# Patient Record
Sex: Female | Born: 1945
Health system: Southern US, Community
[De-identification: ages and names within clinical notes are randomized; demographics above are authoritative.]

## PROBLEM LIST (undated history)

## (undated) DIAGNOSIS — H269 Unspecified cataract: Secondary | ICD-10-CM

## (undated) DIAGNOSIS — E215 Disorder of parathyroid gland, unspecified: Secondary | ICD-10-CM

## (undated) DIAGNOSIS — K449 Diaphragmatic hernia without obstruction or gangrene: Secondary | ICD-10-CM

## (undated) DIAGNOSIS — F329 Major depressive disorder, single episode, unspecified: Secondary | ICD-10-CM

## (undated) DIAGNOSIS — J45909 Unspecified asthma, uncomplicated: Secondary | ICD-10-CM

## (undated) DIAGNOSIS — K219 Gastro-esophageal reflux disease without esophagitis: Secondary | ICD-10-CM

## (undated) DIAGNOSIS — F4310 Post-traumatic stress disorder, unspecified: Secondary | ICD-10-CM

## (undated) DIAGNOSIS — C801 Malignant (primary) neoplasm, unspecified: Secondary | ICD-10-CM

## (undated) DIAGNOSIS — R112 Nausea with vomiting, unspecified: Secondary | ICD-10-CM

## (undated) DIAGNOSIS — A048 Other specified bacterial intestinal infections: Secondary | ICD-10-CM

## (undated) DIAGNOSIS — E785 Hyperlipidemia, unspecified: Secondary | ICD-10-CM

## (undated) DIAGNOSIS — F411 Generalized anxiety disorder: Secondary | ICD-10-CM

## (undated) DIAGNOSIS — F419 Anxiety disorder, unspecified: Secondary | ICD-10-CM

## (undated) DIAGNOSIS — K759 Inflammatory liver disease, unspecified: Secondary | ICD-10-CM

## (undated) DIAGNOSIS — M1990 Unspecified osteoarthritis, unspecified site: Secondary | ICD-10-CM

## (undated) DIAGNOSIS — R011 Cardiac murmur, unspecified: Secondary | ICD-10-CM

## (undated) DIAGNOSIS — Z9889 Other specified postprocedural states: Secondary | ICD-10-CM

## (undated) DIAGNOSIS — R251 Tremor, unspecified: Secondary | ICD-10-CM

## (undated) HISTORY — DX: Unspecified cataract: H26.9

## (undated) HISTORY — DX: Malignant (primary) neoplasm, unspecified: C80.1

## (undated) HISTORY — DX: Major depressive disorder, single episode, unspecified: F32.9

## (undated) HISTORY — DX: Disorder of parathyroid gland, unspecified: E21.5

## (undated) HISTORY — DX: Tremor, unspecified: R25.1

## (undated) HISTORY — DX: Diaphragmatic hernia without obstruction or gangrene: K44.9

## (undated) HISTORY — DX: Hyperlipidemia, unspecified: E78.5

## (undated) HISTORY — DX: Generalized anxiety disorder: F41.1

## (undated) HISTORY — DX: Unspecified osteoarthritis, unspecified site: M19.90

## (undated) HISTORY — PX: RETINAL DETACHMENT SURGERY: SHX105

## (undated) HISTORY — DX: Post-traumatic stress disorder, unspecified: F43.10

## (undated) HISTORY — DX: Other specified bacterial intestinal infections: A04.8

## (undated) HISTORY — PX: CATARACT EXTRACTION, BILATERAL: SHX1313

## (undated) HISTORY — DX: Unspecified asthma, uncomplicated: J45.909

## (undated) HISTORY — DX: Anxiety disorder, unspecified: F41.9

## (undated) HISTORY — DX: Cardiac murmur, unspecified: R01.1

---

## 1991-05-11 HISTORY — PX: TUBAL LIGATION: SHX77

## 1995-05-11 HISTORY — PX: PARATHYROIDECTOMY: SHX19

## 1996-05-10 HISTORY — PX: LAPAROSCOPIC CHOLECYSTECTOMY: SUR755

## 2006-07-22 ENCOUNTER — Ambulatory Visit: Payer: Self-pay | Admitting: Unknown Physician Specialty

## 2009-07-08 ENCOUNTER — Ambulatory Visit: Payer: Self-pay | Admitting: Internal Medicine

## 2009-07-16 ENCOUNTER — Ambulatory Visit: Payer: Self-pay | Admitting: Internal Medicine

## 2009-08-08 ENCOUNTER — Ambulatory Visit: Payer: Self-pay | Admitting: Internal Medicine

## 2009-09-07 ENCOUNTER — Ambulatory Visit: Payer: Self-pay | Admitting: Internal Medicine

## 2010-05-10 HISTORY — PX: RETINAL DETACHMENT SURGERY: SHX105

## 2012-04-25 ENCOUNTER — Ambulatory Visit: Payer: Self-pay | Admitting: Ophthalmology

## 2012-05-09 ENCOUNTER — Ambulatory Visit: Payer: Self-pay | Admitting: Ophthalmology

## 2012-05-10 DIAGNOSIS — H269 Unspecified cataract: Secondary | ICD-10-CM

## 2012-05-10 HISTORY — DX: Unspecified cataract: H26.9

## 2012-07-04 ENCOUNTER — Ambulatory Visit: Payer: Self-pay | Admitting: Ophthalmology

## 2012-08-08 DIAGNOSIS — F341 Dysthymic disorder: Secondary | ICD-10-CM | POA: Diagnosis not present

## 2012-09-07 DIAGNOSIS — F341 Dysthymic disorder: Secondary | ICD-10-CM | POA: Diagnosis not present

## 2012-10-12 DIAGNOSIS — F341 Dysthymic disorder: Secondary | ICD-10-CM | POA: Diagnosis not present

## 2012-11-23 DIAGNOSIS — F341 Dysthymic disorder: Secondary | ICD-10-CM | POA: Diagnosis not present

## 2012-12-26 DIAGNOSIS — F341 Dysthymic disorder: Secondary | ICD-10-CM | POA: Diagnosis not present

## 2013-01-18 DIAGNOSIS — F341 Dysthymic disorder: Secondary | ICD-10-CM | POA: Diagnosis not present

## 2013-02-22 DIAGNOSIS — F341 Dysthymic disorder: Secondary | ICD-10-CM | POA: Diagnosis not present

## 2013-03-01 DIAGNOSIS — F341 Dysthymic disorder: Secondary | ICD-10-CM | POA: Diagnosis not present

## 2013-03-15 DIAGNOSIS — F341 Dysthymic disorder: Secondary | ICD-10-CM | POA: Diagnosis not present

## 2013-04-25 ENCOUNTER — Ambulatory Visit (INDEPENDENT_AMBULATORY_CARE_PROVIDER_SITE_OTHER): Payer: PRIVATE HEALTH INSURANCE | Admitting: Family Medicine

## 2013-04-25 ENCOUNTER — Encounter: Payer: Self-pay | Admitting: Family Medicine

## 2013-04-25 VITALS — BP 126/73 | HR 72 | Temp 97.7°F | Resp 18 | Ht 65.5 in | Wt 235.0 lb

## 2013-04-25 DIAGNOSIS — Z1211 Encounter for screening for malignant neoplasm of colon: Secondary | ICD-10-CM

## 2013-04-25 DIAGNOSIS — Z01419 Encounter for gynecological examination (general) (routine) without abnormal findings: Secondary | ICD-10-CM

## 2013-04-25 DIAGNOSIS — Z23 Encounter for immunization: Secondary | ICD-10-CM

## 2013-04-25 DIAGNOSIS — Z0000 Encounter for general adult medical examination without abnormal findings: Secondary | ICD-10-CM

## 2013-04-25 LAB — CBC WITH DIFFERENTIAL/PLATELET
Basophils Relative: 1 % (ref 0–1)
Eosinophils Absolute: 0.2 10*3/uL (ref 0.0–0.7)
Eosinophils Relative: 2 % (ref 0–5)
HCT: 40.1 % (ref 36.0–46.0)
Hemoglobin: 14 g/dL (ref 12.0–15.0)
Lymphs Abs: 2.4 10*3/uL (ref 0.7–4.0)
MCH: 32.3 pg (ref 26.0–34.0)
MCHC: 34.9 g/dL (ref 30.0–36.0)
MCV: 92.4 fL (ref 78.0–100.0)
Monocytes Absolute: 0.6 10*3/uL (ref 0.1–1.0)
Monocytes Relative: 8 % (ref 3–12)
Neutrophils Relative %: 54 % (ref 43–77)
RDW: 14 % (ref 11.5–15.5)
WBC: 7 10*3/uL (ref 4.0–10.5)

## 2013-04-25 LAB — POCT UA - MICROSCOPIC ONLY
Crystals, Ur, HPF, POC: NEGATIVE
Yeast, UA: NEGATIVE

## 2013-04-25 LAB — COMPLETE METABOLIC PANEL WITH GFR
Alkaline Phosphatase: 81 U/L (ref 39–117)
BUN: 11 mg/dL (ref 6–23)
CO2: 26 mEq/L (ref 19–32)
GFR, Est African American: >89 mL/min
GFR, Est Non African American: 84 mL/min
Glucose, Bld: 88 mg/dL (ref 70–99)
Total Bilirubin: 1 mg/dL (ref 0.3–1.2)
Total Protein: 6.9 g/dL (ref 6.0–8.3)

## 2013-04-25 LAB — POCT URINALYSIS DIPSTICK
Bilirubin, UA: NEGATIVE
Blood, UA: NEGATIVE
Glucose, UA: NEGATIVE
Ketones, UA: NEGATIVE
Nitrite, UA: NEGATIVE
Protein, UA: NEGATIVE
Spec Grav, UA: >=1.03
Urobilinogen, UA: 0.2
pH, UA: 5

## 2013-04-25 LAB — LIPID PANEL
Cholesterol: 199 mg/dL (ref 0–200)
HDL: 58 mg/dL (ref >39)
Total CHOL/HDL Ratio: 3.4 Ratio
VLDL: 23 mg/dL (ref 0–40)

## 2013-04-25 LAB — HEMOGLOBIN A1C: Mean Plasma Glucose: 111 mg/dL (ref <117)

## 2013-04-25 NOTE — Progress Notes (Signed)
Subjective:    Patient ID: Jasmine Malone, female    DOB: 09/19/1945, 67 y.o.   MRN: [id number]  HPI This 67 y.o. female presents to establish care and for CPE.  Last physical 2013. Pap smear 2013. Mammogram several years; agreeable to mammogram. Colonoscopy never; s/p sigmoidoscopy 10 years ago.  Ready for referral to GI. TDAP 2013. Pneumovax at last visit. Zostavax unknown; not sure if has had chicken pox. Flu vaccines due; agreeable. Eye exam s/p cataract surgery.  [facility]. Dental exam working on them.   Thoracic/lumbar back spine:  Onset two years ago.    L posterior cervical LAD:  Not sure of duration; has not noticed.  Has been suffering with a lot of nasal congestion for weeks.   Review of Systems  Constitutional: Negative.   HENT: Positive for dental problem, postnasal drip, sinus pressure, sneezing and sore throat. Negative for ear pain, facial swelling, hearing loss, mouth sores and nosebleeds.   Eyes: Positive for photophobia. Negative for pain, discharge, itching and visual disturbance.  Respiratory: Positive for cough and wheezing. Negative for choking, chest tightness, shortness of breath and stridor.   Cardiovascular: Negative for chest pain, palpitations and leg swelling.  Gastrointestinal: Positive for constipation. Negative for nausea, vomiting, abdominal pain, blood in stool, abdominal distention, anal bleeding and rectal pain.  Endocrine: Positive for polydipsia.  Genitourinary: Positive for dysuria, decreased urine volume and difficulty urinating. Negative for urgency, frequency, hematuria, flank pain, vaginal bleeding, vaginal discharge, enuresis, genital sores, vaginal pain, menstrual problem, pelvic pain and dyspareunia.  Musculoskeletal: Positive for back pain and myalgias. Negative for arthralgias, gait problem, joint swelling, neck pain and neck stiffness.  Skin: Negative.   Allergic/Immunologic: Negative.   Neurological: Negative.     Hematological: Negative.   Psychiatric/Behavioral: Negative for suicidal ideas, sleep disturbance, self-injury, dysphoric mood and decreased concentration. The patient is nervous/anxious.        Objective:   Physical Exam  Nursing note and vitals reviewed. Constitutional: She is oriented to person, place, and time. She appears well-developed and well-nourished. No distress.  HENT:  Head: Normocephalic and atraumatic.  Right Ear: External ear normal.  Left Ear: External ear normal.  Nose: Nose normal.  Mouth/Throat: Oropharynx is clear and moist.  Eyes: Conjunctivae and EOM are normal. Pupils are equal, round, and reactive to light.  Neck: Normal range of motion and full passive range of motion without pain. Neck supple. No JVD present. Carotid bruit is not present. No thyromegaly present.  Cardiovascular: Normal rate, regular rhythm and normal heart sounds.  Exam reveals no gallop and no friction rub.   No murmur heard. Pulmonary/Chest: Effort normal and breath sounds normal. She has no wheezes. She has no rales. Right breast exhibits no inverted nipple, no mass, no nipple discharge and no skin change. Left breast exhibits no inverted nipple, no mass, no nipple discharge, no skin change and no tenderness. Breasts are symmetrical.  Abdominal: Soft. Bowel sounds are normal. She exhibits no distension and no mass. There is no tenderness. There is no rebound and no guarding.  Genitourinary: Vagina normal and uterus normal. No breast swelling, tenderness, discharge or bleeding. There is no rash, tenderness, lesion or injury on the right labia. There is no rash, tenderness, lesion or injury on the left labia. Cervix exhibits no motion tenderness, no discharge and no friability. Right adnexum displays no mass, no tenderness and no fullness. Left adnexum displays no mass, no tenderness and no fullness.  Musculoskeletal:  Right shoulder: Normal.       Left shoulder: Normal.       Cervical  back: Normal.  Lymphadenopathy:    She has cervical adenopathy.       Right cervical: No superficial cervical, no deep cervical and no posterior cervical adenopathy present.      Left cervical: Posterior cervical adenopathy present. No superficial cervical and no deep cervical adenopathy present.  Neurological: She is alert and oriented to person, place, and time. She has normal reflexes. No cranial nerve deficit. She exhibits normal muscle tone. Coordination normal.  Skin: Skin is warm and dry. No rash noted. She is not diaphoretic. No erythema. No pallor.  Psychiatric: She has a normal mood and affect. Her behavior is normal. Judgment and thought content normal.   INFLUENZA VACCINE ADMINISTERED.     Assessment & Plan:  Routine general medical examination at a health care facility - Plan: CBC with Differential, COMPLETE METABOLIC PANEL WITH GFR, Lipid panel, TSH, POCT urinalysis dipstick, EKG 12-Lead, Hemoglobin A1c, POCT UA - Microscopic Only  Routine gynecological examination - Plan: MM Digital Screening, Pap IG (Image Guided), CANCELED: Pap IG (Image Guided)  Colon cancer screening - Plan: Ambulatory referral to Gastroenterology, IFOBT POC (occult bld, rslt in office)  Need for prophylactic vaccination and inoculation against influenza - Plan: Flu Vaccine QUAD 36+ mos IM  1. Complete physical examination/Welcome to Medicare CPE: anticipatory guidance --- weight loss, exercise. Pap smear obtained; refer for mammogram.  Refer for colonoscopy.  S/p flu vaccine in office; check with insurance regarding Zostavax coverage.  S/p Pneumovax ast last visit.  Independent with ADLs; no hearing loss; chronic depression which is stable.   Low to moderate fall risk.  2.  Gynecological exam: pap smear obtained; refer for mammogram. 3.  Colon cancer screening: refer for colonoscopy; hemosure obtained. 4. S/p flu vaccine. 5.  Cervical LAD: New. Associated with acute sinus infection that is improving; RTC  in one month for recheck. If persistent, will warrant further evaluation. 6. Chronic back pain: New; return to evaluate back pain further in one month. No orders of the defined types were placed in this encounter.   Nilda Simmer, M.D.  [facility] [address] [place], [place]  [address] [phone] phone [phone] fax

## 2013-04-26 ENCOUNTER — Encounter: Payer: Self-pay | Admitting: Internal Medicine

## 2013-04-26 LAB — PAP IG (IMAGE GUIDED)

## 2013-04-26 LAB — TSH: TSH: 2.986 u[IU]/mL (ref 0.350–4.500)

## 2013-05-11 ENCOUNTER — Encounter: Payer: Self-pay | Admitting: Radiology

## 2013-05-12 ENCOUNTER — Ambulatory Visit (INDEPENDENT_AMBULATORY_CARE_PROVIDER_SITE_OTHER): Payer: PRIVATE HEALTH INSURANCE | Admitting: Internal Medicine

## 2013-05-12 VITALS — BP 130/80 | HR 60 | Temp 99.1°F | Resp 18 | Ht 66.0 in | Wt 230.0 lb

## 2013-05-12 DIAGNOSIS — R05 Cough: Secondary | ICD-10-CM

## 2013-05-12 DIAGNOSIS — R059 Cough, unspecified: Secondary | ICD-10-CM | POA: Diagnosis not present

## 2013-05-12 DIAGNOSIS — J019 Acute sinusitis, unspecified: Secondary | ICD-10-CM | POA: Diagnosis not present

## 2013-05-12 MED ORDER — HYDROCODONE-HOMATROPINE 5-1.5 MG/5ML PO SYRP: 5.0000 mL | ORAL_SOLUTION | Freq: Four times a day (QID) | ORAL | Status: DC | PRN

## 2013-05-12 MED ORDER — AMOXICILLIN 500 MG PO CAPS: 1000.0000 mg | ORAL_CAPSULE | Freq: Two times a day (BID) | ORAL | Status: AC

## 2013-05-12 NOTE — Progress Notes (Signed)
Subjective:    Patient ID: Jasmine Malone, female    DOB: 05-02-1946, 68 y.o.   MRN: [id number]  HPI  This chart was scribed for Jasmine Malone by Andrew Au, Scribe. This patient was seen in room 5 and the patient's care was started at 9:24 AM.  HPI Comments: Jasmine Malone is a 68 y.o. female who [facility] the [facility] complaining of dark green drainage fr nose, pain in ears, with associated epistaxis oin am, cough, and facial pain. Pt states that she has sick contacts. Pt states her symptoms worsen with certain movements for example when bending  forward. Pt husband states that the pt cough keeps her up at night.  She denies wheezing. She states that she allergic to dust. She reports history of asthma.  Has been ill for over one week   Past Medical History  Diagnosis Date  . Depression   . Heart murmur   . Anxiety     parental strain; previous medicaiton; ;followed by therapist once per month; Nan Hyball in [facility]  Marland Kitchen Asthma     Albuterol use prior to exercise.  No hospitalizations or ED visits.  . Cataracts, bilateral 05/10/2012    s/p B cataract resection.   Past Surgical History  Procedure Laterality Date  . Cholecystectomy    . Tubal ligation    . Parathyroidectomy      3 resected.    . Eye surgery      s/p cataract surgery B; retinal detachment R   Family History  Problem Relation Age of Onset  . Stroke Mother 66    CVA; tobacco abuse.  . Mental illness Mother   . Alcohol abuse Mother   . Diabetes Father   . Cancer Father     brain cancer/malignancy  . Mental illness Father   . Diabetes Brother    History   Social History  . Marital Status: Married    Spouse Name: N/A    Number of Children: N/A  . Years of Education: N/A   Occupational History  . Not on file.   Social History Main Topics  . Smoking status: Never Smoker   . Smokeless tobacco: Never Used  . Alcohol Use: Yes  . Drug Use: No  . Sexual Activity: Yes   Other Topics  Concern  . Not on file   Social History Narrative   Marital status: married x 40+ years; happily married; no abuse.       Children:  4 children (2 girls 2 boys 8, 38, 35, 30); 6 grandchildren.      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      Tobacco:  None         Allergies  Allergen Reactions  . Codeine Nausea Only  . Ibuprofen     Anti inflammatory    There are no active problems to display for this patient.  No orders of the defined types were placed in this encounter.   BP 130/80  Pulse 60  Temp(Src) 99.1 F (37.3 C) (Oral)  Resp 18  Ht 5\' 6"  (1.676 m)  Wt 230 lb (104.327 kg)  BMI 37.14 kg/m2  SpO2 96%   Review of Systems Noncontributory    Objective:   Physical Exam BP 130/80  Pulse 60  Temp(Src) 99.1 F (37.3 C) (Oral)  Resp 18  Ht 5\' 6"  (1.676 m)  Wt 230 lb (104.327 kg)  BMI 37.14 kg/m2  SpO2 96% TMs and conjunctiva clear Nares boggy with purulent discharge Tender maxillary areas to percussion Throat clear/no nodes Chest clear to auscultation       Assessment & Plan:  I have completed the patient encounter in its entirety as documented by the scribe, with editing by me where necessary. Jasmine Malone, M.D. Acute sinusitis, unspecified  Cough  Meds ordered this encounter  Medications  . HYDROcodone-homatropine (HYCODAN) 5-1.5 MG/5ML syrup    Sig: Take 5 mLs by mouth every 6 (six) hours as needed for cough.    Dispense:  120 mL    Refill:  0  . amoxicillin (AMOXIL) 500 MG capsule    Sig: Take 2 capsules (1,000 mg total) by mouth 2 (two) times daily.    Dispense:  40 capsule    Refill:  0

## 2013-05-23 ENCOUNTER — Ambulatory Visit: Payer: Medicare Other

## 2013-05-23 ENCOUNTER — Encounter: Payer: Self-pay | Admitting: Family Medicine

## 2013-05-23 ENCOUNTER — Ambulatory Visit (INDEPENDENT_AMBULATORY_CARE_PROVIDER_SITE_OTHER): Payer: PRIVATE HEALTH INSURANCE | Admitting: Family Medicine

## 2013-05-23 VITALS — BP 128/72 | HR 76 | Temp 98.0°F | Resp 16 | Ht 65.5 in | Wt 234.8 lb

## 2013-05-23 DIAGNOSIS — S39012A Strain of muscle, fascia and tendon of lower back, initial encounter: Secondary | ICD-10-CM

## 2013-05-23 DIAGNOSIS — M7061 Trochanteric bursitis, right hip: Secondary | ICD-10-CM

## 2013-05-23 DIAGNOSIS — M25559 Pain in unspecified hip: Secondary | ICD-10-CM

## 2013-05-23 DIAGNOSIS — M25569 Pain in unspecified knee: Secondary | ICD-10-CM

## 2013-05-23 DIAGNOSIS — R590 Localized enlarged lymph nodes: Secondary | ICD-10-CM

## 2013-05-23 DIAGNOSIS — S335XXA Sprain of ligaments of lumbar spine, initial encounter: Secondary | ICD-10-CM | POA: Diagnosis not present

## 2013-05-23 DIAGNOSIS — M545 Low back pain, unspecified: Secondary | ICD-10-CM | POA: Diagnosis not present

## 2013-05-23 DIAGNOSIS — R599 Enlarged lymph nodes, unspecified: Secondary | ICD-10-CM | POA: Diagnosis not present

## 2013-05-23 DIAGNOSIS — M76899 Other specified enthesopathies of unspecified lower limb, excluding foot: Secondary | ICD-10-CM | POA: Diagnosis not present

## 2013-05-23 DIAGNOSIS — M25551 Pain in right hip: Secondary | ICD-10-CM

## 2013-05-23 DIAGNOSIS — M25561 Pain in right knee: Secondary | ICD-10-CM

## 2013-05-23 MED ORDER — METHOCARBAMOL 500 MG PO TABS: 500.0000 mg | ORAL_TABLET | Freq: Three times a day (TID) | ORAL | Status: DC | PRN

## 2013-05-23 NOTE — Patient Instructions (Signed)

## 2013-05-23 NOTE — Progress Notes (Addendum)
Subjective:    Patient ID: Jasmine Malone, female    DOB: 1945/06/15, 68 y.o.   MRN: [id number]  HPI This chart was scribed for Kristi Smith-MD, by Ladona Ridgel Santos Sollenberger, Scribe. This patient was seen in room 21 and the patient's care was started at 4:46 PM.  HPI Comments: Jasmine Malone is a 68 y.o. female who presents to the [facility] for recheck of enlarged left posterior cervical lymph node and low back pain.  She was seen here last for a sinus infection 1/03 by Dr. Merla Riches - finished AMX yesterday and reports that she feels much better.    She reports left posterior cervical lymph node feels somewhat smaller than last time she was here but is still minimally painful. Although she states it was temporarily enlarged again during her sinus infection episode last week.  Patient admits to poor dentition and wonders if this contributes to L cervical LAD posteriorly.    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 She reports pain radiates down her right lateral leg. She reports exercising seems to improve her pain; specifically using the elliptical machine. 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 She states that she tries to bend her knees when she picks her up but this is also difficult w/her back pain. She states last week, her back pain made it difficult to sleep. She reports that she has had a similar back problem before when she was lifting a child out of her car.  She has been using ibuprofen w/minimal relief. She has not tried Tylenol.  She denies n/t/w in legs; denies b/b dysfunction; denies saddle paresthesias.  Feels that obesity also  contributing.  She reports that she has chills at at night when she is tired. She denies any sores in her mouth, fever.  Also suffers with chronic R hip pain ; +pain with laying on R side at night.  No limping due to R hip pain.  No previous xray.    Injured R knee two years ago with a fall; suffered with daily knee pain for quite a while; knee pain much improved.  No swelling; no giving out. Thinks she must have OA in R knee.  No swelling.  No popping.   There are no active problems to display for this patient.  Past Surgical History  Procedure Laterality Date  . Cholecystectomy    . Tubal ligation    . Parathyroidectomy      3 resected.    . Eye surgery      s/p cataract surgery B; retinal detachment R   Family History  Problem Relation Age of Onset  . Stroke Mother 63    CVA; tobacco abuse.  . Mental illness Mother   . Alcohol abuse Mother   . Diabetes Father   . Cancer Father     brain cancer/malignancy  . Mental illness Father   . Diabetes Brother    History   Social History  . Marital Status: Married    Spouse Name: N/A    Number of Children: N/A  . Years of Education: N/A  Occupational History  . Not on file.   Social History Main Topics  . Smoking status: Never Smoker   . Smokeless tobacco: Never Used  . Alcohol Use: Yes  . Drug Use: No  . Sexual Activity: Yes   Other Topics Concern  . Not on file   Social History Narrative   Marital status: married x 40+ years; happily married; no abuse.       Children:  4 children (2 girls 2 boys 10, 38, 35, 30); 6 grandchildren.      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      Tobacco:  None         Allergies  Allergen Reactions  . Codeine Nausea Only  . Ibuprofen     Anti inflammatory    Results for orders placed in visit on 04/25/13  CBC WITH DIFFERENTIAL      Result Value Range   WBC 7.0  4.0 - 10.5 K/uL   RBC 4.34  3.87 - 5.11 MIL/uL   Hemoglobin 14.0  12.0 -  15.0 g/dL   HCT 16.1  09.6 - 04.5 %   MCV 92.4  78.0 - 100.0 fL   MCH 32.3  26.0 - 34.0 pg   MCHC 34.9  30.0 - 36.0 g/dL   RDW 40.9  81.1 - 91.4 %   Platelets 240  150 - 400 K/uL   Neutrophils Relative % 54  43 - 77 %   Neutro Abs 3.8  1.7 - 7.7 K/uL   Lymphocytes Relative 35  12 - 46 %   Lymphs Abs 2.4  0.7 - 4.0 K/uL   Monocytes Relative 8  3 - 12 %   Monocytes Absolute 0.6  0.1 - 1.0 K/uL   Eosinophils Relative 2  0 - 5 %   Eosinophils Absolute 0.2  0.0 - 0.7 K/uL   Basophils Relative 1  0 - 1 %   Basophils Absolute 0.1  0.0 - 0.1 K/uL   Smear Review Criteria for review not met    COMPLETE METABOLIC PANEL WITH GFR      Result Value Range   Sodium 140  135 - 145 mEq/L   Potassium 3.9  3.5 - 5.3 mEq/L   Chloride 105  96 - 112 mEq/L   CO2 26  19 - 32 mEq/L   Glucose, Bld 88  70 - 99 mg/dL   BUN 11  6 - 23 mg/dL   Creat 7.82  9.56 - 2.13 mg/dL   Total Bilirubin 1.0  0.3 - 1.2 mg/dL   Alkaline Phosphatase 81  39 - 117 U/L   AST 27  0 - 37 U/L   ALT 17  0 - 35 U/L   Total Protein 6.9  6.0 - 8.3 g/dL   Albumin 3.9  3.5 - 5.2 g/dL   Calcium 9.1  8.4 - 08.6 mg/dL   GFR, Est African American >89     GFR, Est Non African American 84    LIPID PANEL      Result Value Range   Cholesterol 199  0 - 200 mg/dL   Triglycerides 578  <469 mg/dL   HDL 58  >62 mg/dL   Total CHOL/HDL Ratio 3.4     VLDL 23  0 - 40 mg/dL   LDL Cholesterol 952 (*) 0 - 99 mg/dL  TSH      Result Value Range   TSH 2.986  0.350 - 4.500 uIU/mL  HEMOGLOBIN A1C      Result Value Range   Hemoglobin A1C 5.5  <5.7 %   Mean Plasma Glucose 111  <117 mg/dL  POCT URINALYSIS DIPSTICK      Result Value Range   Color, UA yellow     Clarity, UA slightly cloudy     Glucose, UA neg     Bilirubin, UA neg     Ketones, UA neg     Spec Grav, UA >=1.030     Blood, UA neg     pH, UA 5.0     Protein, UA neg     Urobilinogen, UA 0.2     Nitrite, UA neg     Leukocytes, UA Wexler (1+)    POCT UA - MICROSCOPIC ONLY       Result Value Range   WBC, Ur, HPF, POC tntc     RBC, urine, microscopic 2-3     Bacteria, U Microscopic 1+     Mucus, UA pos     Epithelial cells, urine per micros 0-1     Crystals, Ur, HPF, POC neg     Casts, Ur, LPF, POC neg     Yeast, UA neg    IFOBT (OCCULT BLOOD)      Result Value Range   IFOBT Negative    PAP IG (IMAGE GUIDED)      Result Value Range   Specimen adequacy:       FINAL DIAGNOSIS:       Cytotechnologist:       Review of Systems  Constitutional: Negative for fever, chills, diaphoresis and fatigue.  HENT: Negative for congestion, ear pain, rhinorrhea, sinus pressure and sore throat.   Respiratory: Negative for cough and shortness of breath.   Cardiovascular: Negative for chest pain.  Gastrointestinal: Negative for abdominal pain.  Musculoskeletal: Positive for arthralgias, back pain and myalgias. Negative for joint swelling, neck pain and neck stiffness.  Skin: Negative for rash.  Neurological: Negative for weakness and numbness.  Hematological: Positive for adenopathy. Does not bruise/bleed easily.      Objective:   Physical Exam  Nursing note and vitals reviewed. Constitutional: She is oriented to person, place, and time. She appears well-developed and well-nourished. No distress.  HENT:  Head: Normocephalic and atraumatic.  Right Ear: External ear normal.  Left Ear: External ear normal.  Nose: Nose normal.  Mouth/Throat: Oropharynx is clear and moist. No oropharyngeal exudate.  Eyes: Conjunctivae and EOM are normal. Pupils are equal, round, and reactive to light. Right eye exhibits no discharge. Left eye exhibits no discharge.  Neck: Normal range of motion. No thyromegaly present.  Left posterior cervical lymphadenopathy 0.5cm in diameter; +mild TTP but significantly smaller from exam in 04/2013.  Cardiovascular: Normal rate, regular rhythm and normal heart sounds.   No murmur heard. Pulmonary/Chest: Effort normal and breath sounds normal. No  respiratory distress. She has no wheezes. She has no rales.  Musculoskeletal: Normal range of motion. She exhibits tenderness. She exhibits no edema.       Right hip: She exhibits tenderness and bony tenderness. She exhibits normal range of motion, normal strength and no swelling.       Right knee: She exhibits normal range of motion, no swelling, no erythema, normal alignment, normal patellar mobility, normal meniscus and no MCL laxity. Tenderness found. Medial joint line and lateral joint line tenderness noted. No patellar tendon tenderness noted.       Lumbar back: She exhibits pain. She exhibits normal range of  motion, no tenderness and no spasm.  Full ROM of lumbar spine w/out pain.  No midline tenderness to palpation and no paraspinal tenderness to palpation. SLR negative bilaterally. 5/5 motor strength BLE Marching and heel to toe intact. Right hip tenderness to palpation right trochanter.  Full internal and external rotation of right hip. Pain w/resistance of internal rotation right knee w/out effusion. Mild tenderness to palpation lateral joint line.   Lymphadenopathy:    She has cervical adenopathy.  Neurological: She is alert and oriented to person, place, and time.  Skin: Skin is warm and dry.  Psychiatric: She has a normal mood and affect. Thought content normal.   Triage Vitals: BP 128/72  Pulse 76  Temp(Src) 98 F (36.7 C) (Oral)  Resp 16  Ht 5' 5.5" (1.664 m)  Wt 234 lb 12.8 oz (106.505 kg)  BMI 38.46 kg/m2  SpO2 98%  DIAGNOSTIC STUDIES: Oxygen Saturation is 98% on room air, normal by my interpretation.    COORDINATION OF CARE: At 405 PM Discussed treatment plan with patient which includes back, him and knee X-rays. Patient agrees.     [facility] reading (PRIMARY) by  Dr. Katrinka Blazing. LUMBAR SPINE: DIFFUSE SPURRING; DDD VARIOUS LEVELS.  R KNEE FILMS: NAD.   R HIP: MILD DEGENERATIVE CHANGES; NAD.    Assessment & Plan:  Low back pain - Plan: DG Lumbar Spine Complete  Right  hip pain - Plan: DG Hip Complete Right  Right knee pain - Plan: DG Knee Complete 4 Views Right  LAD (lymphadenopathy), posterior cervical  Lumbar strain  1.  Low back pain/Lumbar strain/DDD lumbar spine:  New.  Rx for Robaxin provided to use qhs and PRN.  Can use Tylenol for pain during the Dearl Rudden. Does not tolerate NSAIDs due to [facility].  Home exercises provided; encourage regular exercise and core strengthening. 2.  R hip pain/R trochanteric bursitis:  New.  Recommend rest, ice.  Exercise should help. 3.  R knee pain/strain:  New.  Recommend icing, rest when recurs or worsens. 4.  L posterior cervical LAD:  Improving since last visit one month ago.  Continue to monitor; RTC if increases in size or becomes more painful.  Meds ordered this encounter  Medications  . Multiple Vitamin (MULTIVITAMIN) tablet    Sig: Take 1 tablet by mouth daily.  . Omega-3 Fatty Acids (FISH OIL PO)    Sig: Take by mouth daily.  Stephanie Acre (MSM GLUCOSAMINE COMPLEX PO)    Sig: Take by mouth daily.  Marland Kitchen OVER THE COUNTER MEDICATION    Sig: daily.  Marland Kitchen aspirin EC 81 MG tablet    Sig: Take 81 mg by mouth daily.  . ALBUTEROL IN    Sig: Inhale into the lungs as needed.  . methocarbamol (ROBAXIN) 500 MG tablet    Sig: Take 1 tablet (500 mg total) by mouth every 8 (eight) hours as needed for muscle spasms.    Dispense:  40 tablet    Refill:  0    I personally performed the services described in this documentation, which was scribed in my presence.  The recorded information has been reviewed and is accurate.  Nilda Simmer, M.D.  [facility] [address] [place], [place]  [address] [phone] phone [phone] fax

## 2013-06-21 ENCOUNTER — Ambulatory Visit (AMBULATORY_SURGERY_CENTER): Payer: PRIVATE HEALTH INSURANCE | Admitting: *Deleted

## 2013-06-21 VITALS — Ht 66.0 in | Wt 233.4 lb

## 2013-06-21 DIAGNOSIS — Z1211 Encounter for screening for malignant neoplasm of colon: Secondary | ICD-10-CM

## 2013-06-21 MED ORDER — MOVIPREP 100 G PO SOLR: ORAL | Status: DC

## 2013-06-21 NOTE — Progress Notes (Signed)
No allergies to eggs or soy. No problems with anesthesia.  

## 2013-07-05 ENCOUNTER — Encounter: Payer: Self-pay | Admitting: Internal Medicine

## 2013-08-02 ENCOUNTER — Ambulatory Visit (AMBULATORY_SURGERY_CENTER): Payer: PRIVATE HEALTH INSURANCE | Admitting: Internal Medicine

## 2013-08-02 ENCOUNTER — Encounter: Payer: Self-pay | Admitting: Internal Medicine

## 2013-08-02 VITALS — BP 137/88 | HR 72 | Temp 98.2°F | Resp 15 | Ht 66.0 in | Wt 233.0 lb

## 2013-08-02 DIAGNOSIS — D126 Benign neoplasm of colon, unspecified: Secondary | ICD-10-CM

## 2013-08-02 DIAGNOSIS — Z1211 Encounter for screening for malignant neoplasm of colon: Secondary | ICD-10-CM

## 2013-08-02 MED ORDER — SODIUM CHLORIDE 0.9 % IV SOLN: 500.0000 mL | INTRAVENOUS | Status: DC

## 2013-08-02 NOTE — Op Note (Signed)
[facility]  [address] [place], [address]   COLONOSCOPY PROCEDURE REPORT  PATIENT: Jasmine Malone, Jasmine Malone  MR#: [id number] BIRTHDATE: 1945-09-16 , 67  yrs. old GENDER: Female ENDOSCOPIST: Jerene Bears, MD REFERRED OQ:HUTMLY Tamala Julian, M.D. PROCEDURE DATE:  08/02/2013 PROCEDURE:   Colonoscopy with snare polypectomy First Screening Colonoscopy - Avg.  risk and is 50 yrs.  old or older Yes.  Prior Negative Screening - Now for repeat screening. N/A  History of Adenoma - Now for follow-up colonoscopy & has been > or = to 3 yrs.  N/A  Polyps Removed Today? Yes. ASA CLASS:   Class II INDICATIONS:average risk screening and first colonoscopy. MEDICATIONS: MAC sedation, administered by CRNA and propofol (Diprivan) 250mg  IV  DESCRIPTION OF PROCEDURE:   After the risks benefits and alternatives of the procedure were thoroughly explained, informed consent was obtained.  A digital rectal exam revealed no rectal mass.   The LB [id number]  endoscope was introduced through the anus and advanced to the cecum, which was identified by both the appendix and ileocecal valve. No adverse events experienced. The quality of the prep was good, using MoviPrep  The instrument was then slowly withdrawn as the colon was fully examined.   COLON FINDINGS: Four sessile polyps ranging between 3-24mm in size were found at the cecum, in the ascending colon, transverse colon, and sigmoid colon.  Polypectomy was performed using cold snare. All resections were complete and all polyp tissue was completely retrieved.   Mild diverticulosis was noted in the ascending colon, descending colon, and sigmoid colon.  Retroflexed views revealed no abnormalities. The time to cecum=8 minutes 15 seconds.  Withdrawal time=10 minutes 00 seconds.  The scope was withdrawn and the procedure completed.   COMPLICATIONS: There were no complications.   ENDOSCOPIC IMPRESSION: 1.   Four sessile polyps ranging between  3-81mm in size were found at the cecum, in the ascending colon, transverse colon, and sigmoid colon; Polypectomy was performed using cold snare 2.   Mild diverticulosis was noted in the ascending colon, descending colon, and sigmoid colon  RECOMMENDATIONS: 1.  Await pathology results 2.  Avoid all NSAIDS for the next 2 weeks. 3.  High fiber diet 4.  Timing of repeat colonoscopy will be determined by pathology findings. 5.  You will receive a letter within 1-2 weeks with the results of your biopsy as well as final recommendations.  Please call my office if you have not received a letter after 3 weeks.   eSigned:  Jerene Bears, MD 08/02/2013 1:59 PM   cc: The Patient and Reginia Forts, MD   PATIENT NAMEMirza, Kidney MR#: [id number]

## 2013-08-02 NOTE — Progress Notes (Signed)
Called to room to assist during endoscopic procedure.  Patient ID and intended procedure confirmed with present staff. Received instructions for my participation in the procedure from the performing physician.  

## 2013-08-02 NOTE — Progress Notes (Signed)
Lidocaine-40mg IV prior to Propofol InductionPropofol given over incremental dosages 

## 2013-08-02 NOTE — Patient Instructions (Signed)
YOU HAD AN ENDOSCOPIC PROCEDURE TODAY AT THE [facility]: Refer to the procedure report that was given to you for any specific questions about what was found during the examination.  If the procedure report does not answer your questions, please call your gastroenterologist to clarify.  If you requested that your care partner not be given the details of your procedure findings, then the procedure report has been included in a sealed envelope for you to review at your convenience later.  YOU SHOULD EXPECT: Some feelings of bloating in the abdomen. Passage of more gas than usual.  Walking can help get rid of the air that was put into your GI tract during the procedure and reduce the bloating. If you had a lower endoscopy (such as a colonoscopy or flexible sigmoidoscopy) you may notice spotting of blood in your stool or on the toilet paper. If you underwent a bowel prep for your procedure, then you may not have a normal bowel movement for a few days.  DIET: Your first meal following the procedure should be a light meal and then it is ok to progress to your normal diet.  A half-sandwich or bowl of soup is an example of a good first meal.  Heavy or fried foods are harder to digest and may make you feel nauseous or bloated.  Likewise meals heavy in dairy and vegetables can cause extra gas to form and this can also increase the bloating.  Drink plenty of fluids but you should avoid alcoholic beverages for 24 hours.  ACTIVITY: Your care partner should take you home directly after the procedure.  You should plan to take it easy, moving slowly for the rest of the day.  You can resume normal activity the day after the procedure however you should NOT DRIVE or use heavy machinery for 24 hours (because of the sedation medicines used during the test).    SYMPTOMS TO REPORT IMMEDIATELY: A gastroenterologist can be reached at any hour.  During normal business hours, 8:30 AM to 5:00 PM Monday through Friday,  call [phone].  After hours and on weekends, please call the GI answering service at [phone] who will take a message and have the physician on call contact you.   Following lower endoscopy (colonoscopy or flexible sigmoidoscopy):  Excessive amounts of blood in the stool  Significant tenderness or worsening of abdominal pains  Swelling of the abdomen that is new, acute  Fever of 100F or higher    FOLLOW UP: If any biopsies were taken you will be contacted by phone or by letter within the next 1-3 weeks.  Call your gastroenterologist if you have not heard about the biopsies in 3 weeks.  Our staff will call the home number listed on your records the next business day following your procedure to check on you and address any questions or concerns that you may have at that time regarding the information given to you following your procedure. This is a courtesy call and so if there is no answer at the home number and we have not heard from you through the emergency physician on call, we will assume that you have returned to your regular daily activities without incident.  SIGNATURES/CONFIDENTIALITY: You and/or your care partner have signed paperwork which will be entered into your electronic medical record.  These signatures attest to the fact that that the information above on your After Visit Summary has been reviewed and is understood.  Full responsibility of the confidentiality   of this discharge information lies with you and/or your care-partner.  Polyps, diverticulosis, and high fiber diet information given.  Avoid all nonsteroidal anti-inflammatory medication for 2 weeks.

## 2013-08-03 ENCOUNTER — Telehealth: Payer: Self-pay

## 2013-08-03 NOTE — Telephone Encounter (Signed)
  Follow up Call-  Call back number 08/02/2013  Post procedure Call Back phone  # [phone]  Permission to leave phone message Yes     Patient questions:  Do you have a fever, pain , or abdominal swelling? no Pain Score  0 *  Have you tolerated food without any problems? yes  Have you been able to return to your normal activities? yes  Do you have any questions about your discharge instructions: Diet   no Medications  no Follow up visit  no  Do you have questions or concerns about your Care? no  Actions: * If pain score is 4 or above: No action needed, pain <4.   No problems per the pt. maw

## 2013-08-08 ENCOUNTER — Encounter: Payer: Self-pay | Admitting: Internal Medicine

## 2013-09-05 ENCOUNTER — Ambulatory Visit: Payer: Self-pay | Admitting: Ophthalmology

## 2013-09-05 DIAGNOSIS — I499 Cardiac arrhythmia, unspecified: Secondary | ICD-10-CM

## 2013-09-05 DIAGNOSIS — H33009 Unspecified retinal detachment with retinal break, unspecified eye: Secondary | ICD-10-CM | POA: Diagnosis not present

## 2013-09-05 DIAGNOSIS — Z01810 Encounter for preprocedural cardiovascular examination: Secondary | ICD-10-CM | POA: Diagnosis not present

## 2013-09-06 ENCOUNTER — Ambulatory Visit: Payer: Self-pay | Admitting: Ophthalmology

## 2013-09-06 DIAGNOSIS — H33029 Retinal detachment with multiple breaks, unspecified eye: Secondary | ICD-10-CM | POA: Diagnosis not present

## 2013-09-06 DIAGNOSIS — R011 Cardiac murmur, unspecified: Secondary | ICD-10-CM | POA: Diagnosis not present

## 2013-09-06 DIAGNOSIS — J45909 Unspecified asthma, uncomplicated: Secondary | ICD-10-CM | POA: Diagnosis not present

## 2013-09-06 DIAGNOSIS — Z79899 Other long term (current) drug therapy: Secondary | ICD-10-CM | POA: Diagnosis not present

## 2013-09-06 DIAGNOSIS — K449 Diaphragmatic hernia without obstruction or gangrene: Secondary | ICD-10-CM | POA: Diagnosis not present

## 2013-09-06 DIAGNOSIS — H3320 Serous retinal detachment, unspecified eye: Secondary | ICD-10-CM | POA: Diagnosis not present

## 2013-09-06 DIAGNOSIS — Z85828 Personal history of other malignant neoplasm of skin: Secondary | ICD-10-CM | POA: Diagnosis not present

## 2013-09-06 DIAGNOSIS — K219 Gastro-esophageal reflux disease without esophagitis: Secondary | ICD-10-CM | POA: Diagnosis not present

## 2013-09-06 DIAGNOSIS — H33019 Retinal detachment with single break, unspecified eye: Secondary | ICD-10-CM | POA: Diagnosis not present

## 2013-09-06 DIAGNOSIS — R0602 Shortness of breath: Secondary | ICD-10-CM | POA: Diagnosis not present

## 2013-10-02 ENCOUNTER — Ambulatory Visit (INDEPENDENT_AMBULATORY_CARE_PROVIDER_SITE_OTHER): Payer: PRIVATE HEALTH INSURANCE | Admitting: Internal Medicine

## 2013-10-02 ENCOUNTER — Ambulatory Visit: Payer: Medicare Other

## 2013-10-02 VITALS — BP 122/76 | HR 92 | Temp 98.2°F | Resp 18 | Ht 65.5 in | Wt 230.0 lb

## 2013-10-02 DIAGNOSIS — M25571 Pain in right ankle and joints of right foot: Secondary | ICD-10-CM

## 2013-10-02 DIAGNOSIS — M25579 Pain in unspecified ankle and joints of unspecified foot: Secondary | ICD-10-CM | POA: Diagnosis not present

## 2013-10-02 NOTE — Progress Notes (Signed)
Subjective:    Patient ID: Jasmine Malone, female    DOB: 01/08/1946, 68 y.o.   MRN: [id number] This chart was scribed for [name], MD by Evon Slack, ED Scribe. This Patient was seen in room 04 and the patients care was started at 8:23 PM  HPI  HPI Comments: Jasmine Malone is a 68 y.o. female who presents to the [facility] complaining of right ankle injury onset 5 days prior. She states that she was walking in the garage and she tripped and fell. She states in the garage there was a Goupil step that she didn't notice. She states that with the injury it is difficult to walk and slightly swollen. She states that she fell on her wrist. She denies LOC or any sleep disturbance.   There are no active problems to display for this patient.  Prior to Admission medications   Medication Sig Start Date End Date Taking? Authorizing Provider  ALBUTEROL IN Inhale into the lungs as needed.   Yes Historical Provider, MD  aspirin EC 81 MG tablet Take 81 mg by mouth daily.   Yes Historical Provider, MD  Glucos-MSM-C-Mn-Ginger-Willow (MSM GLUCOSAMINE COMPLEX PO) Take by mouth daily.   Yes Historical Provider, MD  Multiple Vitamin (MULTIVITAMIN) tablet Take 1 tablet by mouth daily.   Yes Historical Provider, MD  Omega-3 Fatty Acids (FISH OIL PO) Take by mouth daily.   Yes Historical Provider, MD  OVER THE COUNTER MEDICATION daily.   Yes Historical Provider, MD    Cares for 65mo old granddaugh 2 d a week-feeding tube Review of Systems  Musculoskeletal: Positive for arthralgias and gait problem.       Right ankle pain  Neurological: Negative for syncope.  Psychiatric/Behavioral: Negative for sleep disturbance.       Objective:   Physical Exam  Nursing note and vitals reviewed. Constitutional: She is oriented to person, place, and time. She appears well-developed and well-nourished. No distress.  HENT:  Head: Normocephalic and atraumatic.  Eyes: EOM are normal.  Neck: Neck  supple.  Cardiovascular: Normal rate.   Pulmonary/Chest: Effort normal. No respiratory distress.  Musculoskeletal: Normal range of motion.  Right ankle swelling over medial mellous, tender to palpation, and to stressors, achilles intact, foot otherwise nontender  Neurological: She is alert and oriented to person, place, and time.  Skin: Skin is warm and dry.  Psychiatric: She has a normal mood and affect. Her behavior is normal.   [facility] reading (PRIMARY) by  Dr. Desera Graffeo=no medial fracture         Assessment & Plan:  Ankle pain, right - Plan: DG Ankle Complete Right  medial malleolus-deltoid ligament sprain swedo Ice/elevate Exercises given f/u 3 weeks if not progressing from brace

## 2013-10-16 DIAGNOSIS — F341 Dysthymic disorder: Secondary | ICD-10-CM | POA: Diagnosis not present

## 2013-11-08 DIAGNOSIS — H43829 Vitreomacular adhesion, unspecified eye: Secondary | ICD-10-CM | POA: Diagnosis not present

## 2013-11-08 DIAGNOSIS — H3320 Serous retinal detachment, unspecified eye: Secondary | ICD-10-CM | POA: Diagnosis not present

## 2013-12-02 DIAGNOSIS — H3320 Serous retinal detachment, unspecified eye: Secondary | ICD-10-CM | POA: Diagnosis not present

## 2013-12-12 DIAGNOSIS — Z7982 Long term (current) use of aspirin: Secondary | ICD-10-CM | POA: Diagnosis not present

## 2013-12-12 DIAGNOSIS — J45909 Unspecified asthma, uncomplicated: Secondary | ICD-10-CM | POA: Diagnosis not present

## 2013-12-12 DIAGNOSIS — E7800 Pure hypercholesterolemia, unspecified: Secondary | ICD-10-CM | POA: Diagnosis not present

## 2013-12-12 DIAGNOSIS — K759 Inflammatory liver disease, unspecified: Secondary | ICD-10-CM | POA: Diagnosis not present

## 2013-12-12 DIAGNOSIS — Z885 Allergy status to narcotic agent status: Secondary | ICD-10-CM | POA: Diagnosis not present

## 2013-12-12 DIAGNOSIS — H33029 Retinal detachment with multiple breaks, unspecified eye: Secondary | ICD-10-CM | POA: Diagnosis not present

## 2013-12-12 DIAGNOSIS — H3520 Other non-diabetic proliferative retinopathy, unspecified eye: Secondary | ICD-10-CM | POA: Diagnosis not present

## 2014-01-09 DIAGNOSIS — Z885 Allergy status to narcotic agent status: Secondary | ICD-10-CM | POA: Diagnosis not present

## 2014-01-09 DIAGNOSIS — H338 Other retinal detachments: Secondary | ICD-10-CM | POA: Diagnosis not present

## 2014-01-09 DIAGNOSIS — Z7982 Long term (current) use of aspirin: Secondary | ICD-10-CM | POA: Diagnosis not present

## 2014-01-09 DIAGNOSIS — Z9849 Cataract extraction status, unspecified eye: Secondary | ICD-10-CM | POA: Diagnosis not present

## 2014-01-09 DIAGNOSIS — J45909 Unspecified asthma, uncomplicated: Secondary | ICD-10-CM | POA: Diagnosis not present

## 2014-01-09 DIAGNOSIS — H3520 Other non-diabetic proliferative retinopathy, unspecified eye: Secondary | ICD-10-CM | POA: Diagnosis not present

## 2014-01-09 DIAGNOSIS — E7800 Pure hypercholesterolemia, unspecified: Secondary | ICD-10-CM | POA: Diagnosis not present

## 2014-01-09 DIAGNOSIS — Z961 Presence of intraocular lens: Secondary | ICD-10-CM | POA: Diagnosis not present

## 2014-01-17 DIAGNOSIS — H3320 Serous retinal detachment, unspecified eye: Secondary | ICD-10-CM | POA: Diagnosis not present

## 2014-01-24 ENCOUNTER — Ambulatory Visit (INDEPENDENT_AMBULATORY_CARE_PROVIDER_SITE_OTHER): Payer: PRIVATE HEALTH INSURANCE | Admitting: Family Medicine

## 2014-01-24 VITALS — BP 120/80 | HR 88 | Temp 98.4°F | Resp 16 | Ht 66.0 in | Wt 225.0 lb

## 2014-01-24 DIAGNOSIS — F341 Dysthymic disorder: Secondary | ICD-10-CM | POA: Diagnosis not present

## 2014-01-24 DIAGNOSIS — N950 Postmenopausal bleeding: Secondary | ICD-10-CM

## 2014-01-24 LAB — POCT CBC
GRANULOCYTE PERCENT: 70.9 % (ref 37–80)
HCT, POC: 45.2 % (ref 37.7–47.9)
HEMOGLOBIN: 14.7 g/dL (ref 12.2–16.2)
LYMPH, POC: 3.2 (ref 0.6–3.4)
MCH, POC: 31.9 pg — AB (ref 27–31.2)
MCHC: 32.5 g/dL (ref 31.8–35.4)
MCV: 98.1 fL — AB (ref 80–97)
MID (cbc): 0.3 (ref 0–0.9)
MPV: 8.3 fL (ref 0–99.8)
POC GRANULOCYTE: 8.5 — AB (ref 2–6.9)
POC LYMPH %: 26.8 % (ref 10–50)
POC MID %: 2.3 % (ref 0–12)
Platelet Count, POC: 247 10*3/uL (ref 142–424)
RBC: 4.6 M/uL (ref 4.04–5.48)
RDW, POC: 14.8 %
WBC: 12 10*3/uL — AB (ref 4.6–10.2)

## 2014-01-24 NOTE — Progress Notes (Signed)
[facility] [address], [place] [address] [phone]  Date:  01/24/2014   Name:  Jasmine Malone   DOB:  Feb 20, 1946   MRN:  [id number]  PCP:  Nilda Simmer, MD    Chief Complaint: Vaginal Bleeding   History of Present Illness:  Jasmine Malone is a 69 y.o. very pleasant female patient who presents with the following:  In the 1990s she had a uterine ablation- after that she had just minimal vaginal bleeding.  She then completley stopped bleeding about a year later.    She noted "bright red blood like a full period" over the last 2 weeks- it is now brownish.  She does not have any pain.   She did feel that she went through menopause with other sx in her late 50s/ early 51s.   She is having trouble with her left eye- she has had detached retina surgery x3.    There are no active problems to display for this patient.   Past Medical History  Diagnosis Date  . Depression   . Heart murmur   . Anxiety     parental strain; previous medicaiton; ;followed by therapist once per month; Nan Hyball in [facility].  Marland Kitchen Asthma     Albuterol use prior to exercise.  No hospitalizations or ED visits.  . Cataracts, bilateral 05/10/2012    s/p B cataract resection.  . Hiatal hernia     Past Surgical History  Procedure Laterality Date  . Laparoscopic cholecystectomy  1998  . Tubal ligation  1993  . Parathyroidectomy  1997    3 resected.    . Eye surgery Bilateral 2013, 2014    left; 2013, right 2014  . Retinal detachment surgery Right 2012    History  Substance Use Topics  . Smoking status: Never Smoker   . Smokeless tobacco: Never Used  . Alcohol Use: Yes     Comment: occasional glass of wine    Family History  Problem Relation Age of Onset  . Stroke Mother 67    CVA; tobacco abuse.  . Mental illness Mother   . Alcohol abuse Mother   . Diabetes Father   . Cancer Father     brain cancer/malignancy  . Mental illness Father   . Diabetes Brother   . Colon cancer  Neg Hx     Allergies  Allergen Reactions  . Codeine Nausea Only  . Ibuprofen     Irritates stomach     Medication list has been reviewed and updated.  Current Outpatient Prescriptions on File Prior to Visit  Medication Sig Dispense Refill  . ALBUTEROL IN Inhale into the lungs as needed.      Marland Kitchen aspirin EC 81 MG tablet Take 81 mg by mouth daily.      Stephanie Acre (MSM GLUCOSAMINE COMPLEX PO) Take by mouth daily.      . Multiple Vitamin (MULTIVITAMIN) tablet Take 1 tablet by mouth daily.      . Omega-3 Fatty Acids (FISH OIL PO) Take by mouth daily.      Marland Kitchen OVER THE COUNTER MEDICATION daily.       No current facility-administered medications on file prior to visit.    Review of Systems:  As per HPI- otherwise negative.   Physical Examination: Filed Vitals:   01/24/14 1716  BP: 120/80  Pulse: 88  Temp: 98.4 F (36.9 C)  Resp: 16   Filed Vitals:   01/24/14 1716  Height: 5\' 6"  (  1.676 m)  Weight: 225 lb (102.059 kg)   Body mass index is 36.33 kg/(m^2). Ideal Body Weight: Weight in (lb) to have BMI = 25: 154.6  GEN: WDWN, NAD, Non-toxic, A & O x 3 HEENT: Atraumatic, Normocephalic. Neck supple. No masses, No LAD. Ears and Nose: No external deformity. CV: RRR, No M/G/R. No JVD. No thrill. No extra heart sounds. PULM: CTA B, no wheezes, crackles, rhonchi. No retractions. No resp. distress. No accessory muscle use. ABD: S, NT, ND, +BS. No rebound. No HSM. EXTR: No c/c/e NEURO Normal gait.  PSYCH: Normally interactive. Conversant. Not depressed or anxious appearing.  Calm demeanor.  GU:    Results for orders placed in visit on 01/24/14  POCT CBC      Result Value Ref Range   WBC 12.0 (*) 4.6 - 10.2 K/uL   Lymph, poc 3.2  0.6 - 3.4   POC LYMPH PERCENT 26.8  10 - 50 %L   MID (cbc) 0.3  0 - 0.9   POC MID % 2.3  0 - 12 %M   POC Granulocyte 8.5 (*) 2 - 6.9   Granulocyte percent 70.9  37 - 80 %G   RBC 4.60  4.04 - 5.48 M/uL   Hemoglobin 14.7  12.2 -  16.2 g/dL   HCT, POC 47.4  25.9 - 47.9 %   MCV 98.1 (*) 80 - 97 fL   MCH, POC 31.9 (*) 27 - 31.2 pg   MCHC 32.5  31.8 - 35.4 g/dL   RDW, POC 56.3     Platelet Count, POC 247  142 - 424 K/uL   MPV 8.3  0 - 99.8 fL    Assessment and Plan: Post-menopausal bleeding - Plan: POCT CBC, Ambulatory referral to Obstetrics / Gynecology  Explained the need to fully evaluate this post- menopausal bleeding.  She understands and will make sure she get her appt. Explained that her WBC count is a bit high- she had been on steroids not too long ago and is still on a steroid drop so this could be why. She denies any pain or fever, but will watch for any sign of infection.  Also asked her to have a recheck of her CBC in a few months   Signed Abbe Amsterdam, MD  Her OBG is retired- we will set her up with anew one.

## 2014-01-24 NOTE — Patient Instructions (Signed)
We will get you set up to see OB[facility] as soon as possible.  In the meantime there is nothing you need to do unless you develop heavy bleeding or have any other concerns.  It is very important that you see GYN regarding this issue; if you do not hear about your appt for some reason please call me

## 2014-01-31 DIAGNOSIS — N950 Postmenopausal bleeding: Secondary | ICD-10-CM | POA: Diagnosis not present

## 2014-02-04 DIAGNOSIS — N939 Abnormal uterine and vaginal bleeding, unspecified: Secondary | ICD-10-CM | POA: Diagnosis not present

## 2014-02-04 DIAGNOSIS — N926 Irregular menstruation, unspecified: Secondary | ICD-10-CM | POA: Diagnosis not present

## 2014-02-04 DIAGNOSIS — N950 Postmenopausal bleeding: Secondary | ICD-10-CM | POA: Diagnosis not present

## 2014-02-12 NOTE — H&P (Signed)
NAME:  Jasmine Malone, Jasmine Malone                ACCOUNT NO.:  [id number]  MEDICAL RECORD NO.:  [id number]  LOCATION:  [facility]                         FACILITY:  [facility]  PHYSICIAN:  Ralene Bathe. Matthew Saras, M.D.DATE OF BIRTH:  1946-03-06  DATE OF ADMISSION:  02/07/2014 DATE OF DISCHARGE:                             HISTORY & PHYSICAL   CHIEF COMPLAINT:  Postmenopausal bleeding.  HISTORY OF PRESENT ILLNESS:  A 68 year old, postmenopausal G4, P4 prior tubal ligation.  This patient was evaluated by Dr. Janett Billow Copland for postmenopausal bleeding and sent to [place] for evaluation.  Pelvic exam at the time initially was unremarkable.  Her Pap 12/14 was negative. Sonohysterogram was done in our office dated February 04, 2014, that demonstrated some thickened endometrium on initial scan.  Adnexa negative. With saline infusion, there was a endometrial polyp noted at the fundal area.  She presents now for D and C, hysteroscopy with TruClear resection.  This procedure including specific risks related to bleeding, infection, other complications such as perforation, may require additional surgery discussed with her which she understands and accepts.  PAST MEDICAL HISTORY:  Allergies none.  MEDICATIONS:  She is on a number of supplements cranberry, omega-3 fatty acids, MSM, red yeast rice.  She also uses some prednisone eyedrops p.r.n.  SURGICAL HISTORY:  She has had cataract surgery, retinal surgery, cholecystectomy, cardiac ablation in the 90s, parathyroid cyst removed from 4 vaginal deliveries.  SOCIAL HISTORY:  Denies tobacco or drug use.  She does have 2 alcoholic drinks per month.  She is married Dr. Chaya Jan or Lamar Blinks, nor her medical doctors.  FAMILY HISTORY:  Significant for history of migraine, asthma, hiatal hernia and kidney disease.  REVIEW OF SYSTEMS:  Significant for some minimal incontinence in the past and also history of PTSD not currently on any antidepressants.  PHYSICAL  EXAMINATION:  VITAL SIGNS:  Temp 98.2, blood pressure 110/80. HEENT:  Unremarkable. NECK:  Supple without masses. LUNGS:  Clear. CARDIOVASCULAR:  Regular rate and rhythm without murmurs, rubs, or gallops. BREASTS:  Without masses. ABDOMEN:  Soft, flat, nontender. PELVIS:  Vulva, vagina and cervix normal.  Uterus mid position, normal size.  Adnexa negative.  EXTREMITIES:  Unremarkable. NEUROLOGIC:  Unremarkable.  IMPRESSION:  Postmenopausal bleeding, endometrial polyp.  PLAN:  D and C, hysteroscopy with TruClear polyp resection.     Arlin Sass M. Matthew Saras, M.D.     [facility]/MEDQ  D:  02/12/2014  T:  02/12/2014  Job:  [id number]

## 2014-02-12 NOTE — H&P (Signed)
[place]  DICTATION # [id number] CSN# [id number]   Margarette Asal, MD 02/12/2014 10:15 AM

## 2014-02-13 ENCOUNTER — Encounter: Payer: Self-pay | Admitting: Family Medicine

## 2014-02-13 ENCOUNTER — Encounter (HOSPITAL_COMMUNITY): Payer: Self-pay | Admitting: Pharmacist

## 2014-02-13 DIAGNOSIS — N950 Postmenopausal bleeding: Secondary | ICD-10-CM | POA: Insufficient documentation

## 2014-02-15 ENCOUNTER — Encounter (HOSPITAL_COMMUNITY)
Admission: RE | Admit: 2014-02-15 | Discharge: 2014-02-15 | Disposition: A | Discharge status: Home / Self Care | Payer: PRIVATE HEALTH INSURANCE | Source: Ambulatory Visit | Attending: Obstetrics and Gynecology | Admitting: Obstetrics and Gynecology

## 2014-02-15 ENCOUNTER — Encounter (HOSPITAL_COMMUNITY): Payer: Self-pay

## 2014-02-15 DIAGNOSIS — K449 Diaphragmatic hernia without obstruction or gangrene: Secondary | ICD-10-CM | POA: Insufficient documentation

## 2014-02-15 DIAGNOSIS — Z0000 Encounter for general adult medical examination without abnormal findings: Secondary | ICD-10-CM | POA: Insufficient documentation

## 2014-02-15 DIAGNOSIS — R011 Cardiac murmur, unspecified: Secondary | ICD-10-CM | POA: Diagnosis not present

## 2014-02-15 DIAGNOSIS — J45909 Unspecified asthma, uncomplicated: Secondary | ICD-10-CM | POA: Diagnosis not present

## 2014-02-15 DIAGNOSIS — K219 Gastro-esophageal reflux disease without esophagitis: Secondary | ICD-10-CM | POA: Diagnosis not present

## 2014-02-15 DIAGNOSIS — F419 Anxiety disorder, unspecified: Secondary | ICD-10-CM | POA: Diagnosis not present

## 2014-02-15 DIAGNOSIS — N840 Polyp of corpus uteri: Secondary | ICD-10-CM | POA: Diagnosis not present

## 2014-02-15 DIAGNOSIS — N950 Postmenopausal bleeding: Secondary | ICD-10-CM | POA: Diagnosis not present

## 2014-02-15 DIAGNOSIS — F329 Major depressive disorder, single episode, unspecified: Secondary | ICD-10-CM | POA: Diagnosis not present

## 2014-02-15 HISTORY — DX: Other specified postprocedural states: R11.2

## 2014-02-15 HISTORY — DX: Inflammatory liver disease, unspecified: K75.9

## 2014-02-15 HISTORY — DX: Other specified postprocedural states: Z98.890

## 2014-02-15 HISTORY — DX: Gastro-esophageal reflux disease without esophagitis: K21.9

## 2014-02-15 LAB — CBC
HEMATOCRIT: 39.2 % (ref 36.0–46.0)
Hemoglobin: 13.5 g/dL (ref 12.0–15.0)
MCH: 33.3 pg (ref 26.0–34.0)
MCHC: 34.4 g/dL (ref 30.0–36.0)
MCV: 96.8 fL (ref 78.0–100.0)
PLATELETS: 225 10*3/uL (ref 150–400)
RBC: 4.05 MIL/uL (ref 3.87–5.11)
RDW: 13.5 % (ref 11.5–15.5)
WBC: 10.5 10*3/uL (ref 4.0–10.5)

## 2014-02-15 NOTE — Patient Instructions (Addendum)
Your procedure is scheduled on:02/20/14  Enter through the Main Entrance at : 2 am Pick up desk phone and dial [phone] and inform us of your arrival.  Please call [phone] if you have any problems the morning of surgery.  Remember: Do not eat food after midnight: Tuesday Clear liquids are ok until:9am on WED   You may brush your teeth the morning of surgery.   DO NOT wear jewelry, eye make-up, lipstick,body lotion, or dark fingernail polish.  (Polished toes are ok) You may wear deodorant.  If you are to be admitted after surgery, leave suitcase in car until your room has been assigned. Patients discharged on the day of surgery will not be allowed to drive home. Wear loose fitting, comfortable clothes for your ride home.

## 2014-02-19 MED ORDER — CEFOTETAN DISODIUM 2 G IJ SOLR
2.0000 g | INTRAMUSCULAR | Status: AC
  Administered 2014-02-20: 2 g via INTRAVENOUS
  Filled 2014-02-19: qty 2

## 2014-02-20 ENCOUNTER — Encounter (HOSPITAL_COMMUNITY): Payer: Self-pay | Admitting: Anesthesiology

## 2014-02-20 ENCOUNTER — Encounter (HOSPITAL_COMMUNITY)
Admission: RE | Disposition: A | Discharge status: Home / Self Care | Payer: Self-pay | Source: Ambulatory Visit | Attending: Obstetrics and Gynecology

## 2014-02-20 ENCOUNTER — Encounter (HOSPITAL_COMMUNITY): Payer: PRIVATE HEALTH INSURANCE | Admitting: Anesthesiology

## 2014-02-20 ENCOUNTER — Ambulatory Visit (HOSPITAL_COMMUNITY): Payer: PRIVATE HEALTH INSURANCE | Admitting: Anesthesiology

## 2014-02-20 ENCOUNTER — Ambulatory Visit (HOSPITAL_COMMUNITY)
Admission: RE | Admit: 2014-02-20 | Discharge: 2014-02-20 | Disposition: A | Discharge status: Home / Self Care | Payer: PRIVATE HEALTH INSURANCE | Source: Ambulatory Visit | Attending: Obstetrics and Gynecology | Admitting: Obstetrics and Gynecology

## 2014-02-20 DIAGNOSIS — N840 Polyp of corpus uteri: Secondary | ICD-10-CM | POA: Insufficient documentation

## 2014-02-20 DIAGNOSIS — K219 Gastro-esophageal reflux disease without esophagitis: Secondary | ICD-10-CM | POA: Diagnosis not present

## 2014-02-20 DIAGNOSIS — N950 Postmenopausal bleeding: Secondary | ICD-10-CM | POA: Diagnosis not present

## 2014-02-20 HISTORY — PX: DILATATION & CURETTAGE/HYSTEROSCOPY WITH TRUECLEAR: SHX6353

## 2014-02-20 SURGERY — DILATATION & CURETTAGE/HYSTEROSCOPY WITH TRUCLEAR
Anesthesia: General | Site: Vagina

## 2014-02-20 MED ORDER — LIDOCAINE HCL 1 % IJ SOLN
INTRAMUSCULAR | Status: DC | PRN
  Administered 2014-02-20: 8 mL

## 2014-02-20 MED ORDER — DEXAMETHASONE SODIUM PHOSPHATE 4 MG/ML IJ SOLN
INTRAMUSCULAR | Status: DC | PRN
  Administered 2014-02-20: 4 mg via INTRAVENOUS

## 2014-02-20 MED ORDER — DEXAMETHASONE SODIUM PHOSPHATE 10 MG/ML IJ SOLN
INTRAMUSCULAR | Status: AC
  Filled 2014-02-20: qty 1

## 2014-02-20 MED ORDER — KETOROLAC TROMETHAMINE 30 MG/ML IJ SOLN
INTRAMUSCULAR | Status: AC
  Filled 2014-02-20: qty 1

## 2014-02-20 MED ORDER — LIDOCAINE HCL 1 % IJ SOLN
INTRAMUSCULAR | Status: AC
  Filled 2014-02-20: qty 20

## 2014-02-20 MED ORDER — LIDOCAINE HCL (CARDIAC) 20 MG/ML IV SOLN
INTRAVENOUS | Status: DC | PRN
  Administered 2014-02-20: 40 mg via INTRAVENOUS

## 2014-02-20 MED ORDER — FENTANYL CITRATE 0.05 MG/ML IJ SOLN
INTRAMUSCULAR | Status: AC
  Filled 2014-02-20: qty 5

## 2014-02-20 MED ORDER — LACTATED RINGERS IV SOLN
INTRAVENOUS | Status: DC
  Administered 2014-02-20: 13:00:00 via INTRAVENOUS

## 2014-02-20 MED ORDER — MIDAZOLAM HCL 5 MG/5ML IJ SOLN
INTRAMUSCULAR | Status: DC | PRN
  Administered 2014-02-20: 1 mg via INTRAVENOUS

## 2014-02-20 MED ORDER — SCOPOLAMINE 1 MG/3DAYS TD PT72
MEDICATED_PATCH | TRANSDERMAL | Status: AC
  Filled 2014-02-20: qty 1

## 2014-02-20 MED ORDER — SCOPOLAMINE 1 MG/3DAYS TD PT72
1.0000 | MEDICATED_PATCH | Freq: Once | TRANSDERMAL | Status: DC
  Administered 2014-02-20: 1.5 mg via TRANSDERMAL

## 2014-02-20 MED ORDER — ONDANSETRON HCL 4 MG/2ML IJ SOLN
INTRAMUSCULAR | Status: AC
  Filled 2014-02-20: qty 2

## 2014-02-20 MED ORDER — FENTANYL CITRATE 0.05 MG/ML IJ SOLN
INTRAMUSCULAR | Status: DC | PRN
  Administered 2014-02-20: 50 ug via INTRAVENOUS

## 2014-02-20 MED ORDER — PROPOFOL 10 MG/ML IV BOLUS
INTRAVENOUS | Status: DC | PRN
  Administered 2014-02-20: 100 mg via INTRAVENOUS
  Administered 2014-02-20: 20 mg via INTRAVENOUS
  Administered 2014-02-20: 30 mg via INTRAVENOUS

## 2014-02-20 MED ORDER — OXYCODONE-ACETAMINOPHEN 2.5-325 MG PO TABS: 1.0000 | ORAL_TABLET | ORAL | Status: DC | PRN

## 2014-02-20 MED ORDER — PROPOFOL 10 MG/ML IV EMUL
INTRAVENOUS | Status: AC
  Filled 2014-02-20: qty 20

## 2014-02-20 MED ORDER — ONDANSETRON HCL 4 MG/2ML IJ SOLN
INTRAMUSCULAR | Status: DC | PRN
  Administered 2014-02-20: 4 mg via INTRAVENOUS

## 2014-02-20 MED ORDER — SODIUM CHLORIDE 0.9 % IR SOLN
Status: DC | PRN
  Administered 2014-02-20: 3000 mL

## 2014-02-20 MED ORDER — KETOROLAC TROMETHAMINE 30 MG/ML IJ SOLN
INTRAMUSCULAR | Status: DC | PRN
  Administered 2014-02-20: 30 mg via INTRAVENOUS

## 2014-02-20 MED ORDER — LIDOCAINE HCL (CARDIAC) 20 MG/ML IV SOLN
INTRAVENOUS | Status: AC
Start: 2014-02-20 — End: 2014-02-20
  Filled 2014-02-20: qty 5

## 2014-02-20 MED ORDER — MIDAZOLAM HCL 2 MG/2ML IJ SOLN
INTRAMUSCULAR | Status: AC
  Filled 2014-02-20: qty 2

## 2014-02-20 SURGICAL SUPPLY — 15 items
BLADE INCISOR TRUC PLUS 2.9 (ABLATOR) ×1 IMPLANT
CANISTERS HI-FLOW 3000CC (CANNISTER) ×2 IMPLANT
CATH ROBINSON RED A/P 16FR (CATHETERS) ×2 IMPLANT
CLOTH BEACON ORANGE TIMEOUT ST (SAFETY) ×2 IMPLANT
CONTAINER PREFILL 10% NBF 60ML (FORM) ×4 IMPLANT
DRAPE HYSTEROSCOPY (DRAPE) ×2 IMPLANT
GLOVE BIO SURGEON STRL SZ7 (GLOVE) ×4 IMPLANT
GOWN STRL REUS W/TWL LRG LVL3 (GOWN DISPOSABLE) ×4 IMPLANT
INCISOR TRUC PLUS BLADE 2.9 (ABLATOR) ×2
KIT HYSTEROSCOPY TRUCLEAR (ABLATOR) IMPLANT
MORCELLATOR RECIP TRUCLEAR 4.0 (ABLATOR) IMPLANT
PACK VAGINAL MINOR WOMEN LF (CUSTOM PROCEDURE TRAY) ×2 IMPLANT
PAD OB MATERNITY 4.3X12.25 (PERSONAL CARE ITEMS) ×2 IMPLANT
TOWEL OR 17X24 6PK STRL BLUE (TOWEL DISPOSABLE) ×4 IMPLANT
WATER STERILE IRR 1000ML POUR (IV SOLUTION) ×2 IMPLANT

## 2014-02-20 NOTE — Discharge Instructions (Signed)

## 2014-02-20 NOTE — Progress Notes (Signed)
The patient was re-examined with no change in status 

## 2014-02-20 NOTE — Op Note (Signed)
[place]  DICTATION #  CSN# [id number]   Margarette Asal, MD 02/20/2014 2:03 PM

## 2014-02-20 NOTE — Transfer of Care (Signed)
Immediate Anesthesia Transfer of Care Note  Patient: Jasmine Malone  Procedure(s) Performed: Procedure(s): DILATATION & CURETTAGE/HYSTEROSCOPY WITH TRUCLEAR (N/A)  Patient Location: PACU  Anesthesia Type:General  Level of Consciousness: awake, alert  and oriented  Airway & Oxygen Therapy: Patient Spontanous Breathing and Patient connected to nasal cannula oxygen  Post-op Assessment: Report given to PACU RN and Post -op Vital signs reviewed and stable  Post vital signs: Reviewed and stable  Complications: No apparent anesthesia complications

## 2014-02-20 NOTE — Anesthesia Preprocedure Evaluation (Signed)
Anesthesia Evaluation  Patient identified by MRN, date of birth, ID band Patient awake    Reviewed: Allergy & Precautions, H&P , NPO status , Patient's Chart, lab work & pertinent test results  History of Anesthesia Complications (+) PONV  Airway Mallampati: I TM Distance: >3 FB Neck ROM: full    Dental no notable dental hx. (+) Teeth Intact, Poor Dentition   Pulmonary neg pulmonary ROS,          Cardiovascular negative cardio ROS      Neuro/Psych    GI/Hepatic Neg liver ROS,   Endo/Other  negative endocrine ROS  Renal/GU negative Renal ROS     Musculoskeletal   Abdominal Normal abdominal exam  (+)   Peds  Hematology negative hematology ROS (+)   Anesthesia Other Findings   Reproductive/Obstetrics negative OB ROS                           Anesthesia Physical Anesthesia Plan  ASA: II  Anesthesia Plan: General   Post-op Pain Management:    Induction: Intravenous  Airway Management Planned: LMA  Additional Equipment:   Intra-op Plan:   Post-operative Plan:   Informed Consent: I have reviewed the patients History and Physical, chart, labs and discussed the procedure including the risks, benefits and alternatives for the proposed anesthesia with the patient or authorized representative who has indicated his/her understanding and acceptance.     Plan Discussed with: CRNA and Surgeon  Anesthesia Plan Comments:         Anesthesia Quick Evaluation

## 2014-02-21 ENCOUNTER — Encounter (HOSPITAL_COMMUNITY): Payer: Self-pay | Admitting: Obstetrics and Gynecology

## 2014-02-21 NOTE — Anesthesia Postprocedure Evaluation (Signed)
Anesthesia Post Note  Patient: Jasmine Malone  Procedure(s) Performed: Procedure(s) (LRB): DILATATION & CURETTAGE/HYSTEROSCOPY WITH TRUCLEAR (N/A)  Anesthesia type: General  Patient location: PACU  Post pain: Pain level controlled  Post assessment: Post-op Vital signs reviewed  Last Vitals:  Filed Vitals:   02/20/14 1545  BP: 138/90  Pulse: 71  Temp: 36.6 C  Resp: 16    Post vital signs: Reviewed  Level of consciousness: sedated  Complications: No apparent anesthesia complications

## 2014-02-22 NOTE — Op Note (Signed)
NAME:  Jasmine Malone, Jasmine Malone                ACCOUNT NO.:  [id number]  MEDICAL RECORD NO.:  [id number]  LOCATION:                                 FACILITY:  PHYSICIAN:  Ralene Bathe. Matthew Saras, M.D.    DATE OF BIRTH:  DATE OF PROCEDURE:  02/20/2014 DATE OF DISCHARGE:                              OPERATIVE REPORT   PREOPERATIVE DIAGNOSES:  Postmenopausal bleeding, endometrial polyp.  POSTOPERATIVE DIAGNOSES:  Postmenopausal bleeding, endometrial polyp.  PROCEDURE:  Dilation and curettage, hysteroscopy, with TRUCLEAR resection of the endometrial polyp.  SURGEON:  Ralene Bathe. Matthew Saras, MD  ANESTHESIA:  General with LMA.  SPECIMENS REMOVED:  Endometrial curetting/polyp, to Pathology.  ESTIMATED BLOOD LOSS:  Less than 10 mL.  PROCEDURE AND FINDINGS:  The patient was taken to the operating room. After an adequate level of general anesthesia was obtained with the patient __________, the perineum and vagina prepped and draped in usual fashion for D and C, hysteroscopy.  The bladder was drained.  Time-out was taken at that point.  Speculum was positioned after EUA revealing the uterus to be mid position, normal size.  Adnexa negative. Paracervical block was then created by infiltrating at 3 and 9 o'clock submucosally, 5 x 7 mL of 1% plain Xylocaine at the site after negative aspiration.  The uterus was sounded to 8 cm, progressively dilated to a 27 Pratt dilator.  The continuous flow hysteroscope was then used to perform hysteroscopy revealing several polypoid areas noted at the uterine fundus.  The 2.9 mm resector was then used to circumferentially clear out the polyps and to clean up any endometrial buildup.  The cavity was cleaned at the end of the procedure.  Minimal bleeding.  She tolerated this well, went to recovery room in good condition.     Colleen Kotlarz M. Matthew Saras, M.D.     RMH/MEDQ  D:  02/20/2014  T:  02/21/2014  Job:  [id number]

## 2014-02-22 NOTE — Op Note (Deleted)
NAME:  Jasmine Malone, Jasmine Malone                ACCOUNT NO.:  [id number]  MEDICAL RECORD NO.:  [id number]  LOCATION:                                 FACILITY:  PHYSICIAN:  Ralene Bathe. Matthew Saras, M.D.    DATE OF BIRTH:  DATE OF PROCEDURE:  02/20/2014 DATE OF DISCHARGE:                              OPERATIVE REPORT   PREOPERATIVE DIAGNOSES:  Postmenopausal bleeding, endometrial polyp.  POSTOPERATIVE DIAGNOSES:  Postmenopausal bleeding, endometrial polyp.  PROCEDURE:  Dilation and curettage, hysteroscopy, with TRUCLEAR resection of the endometrial polyp.  SURGEON:  Ralene Bathe. Matthew Saras, MD  ANESTHESIA:  General with LMA.  SPECIMENS REMOVED:  Endometrial curetting/polyp, to Pathology.  ESTIMATED BLOOD LOSS:  Less than 10 mL.  PROCEDURE AND FINDINGS:  The patient was taken to the operating room. After an adequate level of general anesthesia was obtained with the patient __________, the perineum and vagina prepped and draped in usual fashion for D and C, hysteroscopy.  The bladder was drained.  Time-out was taken at that point.  Speculum was positioned after EUA revealing the uterus to be mid position, normal size.  Adnexa negative. Paracervical block was then created by infiltrating at 3 and 9 o'clock submucosally, 5 x 7 mL of 1% plain Xylocaine at the site after negative aspiration.  The uterus was sounded to 8 cm, progressively dilated to a 27 Pratt dilator.  The continuous flow hysteroscope was then used to perform hysteroscopy revealing several polypoid areas noted at the uterine fundus.  The 2.9 mm resector was then used to circumferentially clear out the polyps and to clean up any endometrial buildup.  The cavity was cleaned at the end of the procedure.  Minimal bleeding.  She tolerated this well, went to recovery room in good condition.     Mylene Bow M. Matthew Saras, M.D.     RMH/MEDQ  D:  02/20/2014  T:  02/21/2014  Job:  [id number]

## 2014-04-01 DIAGNOSIS — H3570 Unspecified separation of retinal layers: Secondary | ICD-10-CM | POA: Diagnosis not present

## 2014-04-23 ENCOUNTER — Telehealth: Payer: Self-pay | Admitting: Family Medicine

## 2014-04-23 NOTE — Telephone Encounter (Signed)
Left a message for patient to come by and get the flu shot or call and update her chart with the flu shot information.

## 2014-06-06 DIAGNOSIS — F341 Dysthymic disorder: Secondary | ICD-10-CM | POA: Diagnosis not present

## 2014-07-04 DIAGNOSIS — F341 Dysthymic disorder: Secondary | ICD-10-CM | POA: Diagnosis not present

## 2014-08-06 DIAGNOSIS — F341 Dysthymic disorder: Secondary | ICD-10-CM | POA: Diagnosis not present

## 2014-08-07 DIAGNOSIS — K449 Diaphragmatic hernia without obstruction or gangrene: Secondary | ICD-10-CM | POA: Diagnosis not present

## 2014-08-07 DIAGNOSIS — J45901 Unspecified asthma with (acute) exacerbation: Secondary | ICD-10-CM | POA: Diagnosis not present

## 2014-08-07 DIAGNOSIS — Z961 Presence of intraocular lens: Secondary | ICD-10-CM | POA: Diagnosis not present

## 2014-08-07 DIAGNOSIS — E785 Hyperlipidemia, unspecified: Secondary | ICD-10-CM | POA: Diagnosis not present

## 2014-08-07 DIAGNOSIS — H3322 Serous retinal detachment, left eye: Secondary | ICD-10-CM | POA: Diagnosis not present

## 2014-08-07 DIAGNOSIS — Z9841 Cataract extraction status, right eye: Secondary | ICD-10-CM | POA: Diagnosis not present

## 2014-08-07 DIAGNOSIS — K219 Gastro-esophageal reflux disease without esophagitis: Secondary | ICD-10-CM | POA: Diagnosis not present

## 2014-08-07 DIAGNOSIS — M4686 Other specified inflammatory spondylopathies, lumbar region: Secondary | ICD-10-CM | POA: Diagnosis not present

## 2014-08-21 ENCOUNTER — Encounter: Payer: Self-pay | Admitting: Family Medicine

## 2014-08-21 ENCOUNTER — Ambulatory Visit (INDEPENDENT_AMBULATORY_CARE_PROVIDER_SITE_OTHER): Payer: PRIVATE HEALTH INSURANCE | Admitting: Family Medicine

## 2014-08-21 VITALS — BP 135/82 | HR 78 | Temp 98.4°F | Resp 18 | Ht 65.5 in | Wt 232.6 lb

## 2014-08-21 DIAGNOSIS — J4521 Mild intermittent asthma with (acute) exacerbation: Secondary | ICD-10-CM

## 2014-08-21 DIAGNOSIS — J069 Acute upper respiratory infection, unspecified: Secondary | ICD-10-CM | POA: Diagnosis not present

## 2014-08-21 MED ORDER — AMOXICILLIN-POT CLAVULANATE 875-125 MG PO TABS: 1.0000 | ORAL_TABLET | Freq: Two times a day (BID) | ORAL | Status: DC

## 2014-08-21 MED ORDER — BUDESONIDE 32 MCG/ACT NA SUSP: 2.0000 | Freq: Every day | NASAL | Status: DC

## 2014-08-21 MED ORDER — BENZONATATE 100 MG PO CAPS: 100.0000 mg | ORAL_CAPSULE | Freq: Three times a day (TID) | ORAL | Status: DC | PRN

## 2014-08-21 MED ORDER — ALBUTEROL SULFATE HFA 108 (90 BASE) MCG/ACT IN AERS: 2.0000 | INHALATION_SPRAY | Freq: Four times a day (QID) | RESPIRATORY_TRACT | Status: DC | PRN

## 2014-08-21 NOTE — Progress Notes (Signed)
Subjective:    Patient ID: Jasmine Malone, female    DOB: 26-Jun-1945, 69 y.o.   MRN: [id number]  08/21/2014  Cough; Nasal Congestion; and chest congestion   HPI This 69 y.o. female presents for evaluation of cough and congestion.  Onset one week ago.  Cough has really worsened at night.  No fever +chills/sweats.  +HA.  No ear pain.  +ST intermittent.  +rhinorrhea clear; +nasal congestion.  No sinus pressure. +cough; +clear sputum.  +PND. Cough is worse at night. Nasal sprays help cough.  Robitussin did not help.  Mild SOB.  Wheezing at night.  No v/d.  Saline nose spray; tried Mucinex without improvement; did help open up.  More in chest.  Had to attend a funeral in [place] two weeks ago.  Had eye surgery as well.  Has started to feeling better for past two days because not having to care for two children.   Review of Systems  Constitutional: Positive for chills, diaphoresis and fatigue. Negative for fever.  HENT: Positive for congestion, postnasal drip, rhinorrhea, sore throat and voice change. Negative for ear pain, sinus pressure and trouble swallowing.   Eyes: Negative for visual disturbance.  Respiratory: Positive for cough. Negative for shortness of breath and wheezing.   Cardiovascular: Negative for chest pain, palpitations and leg swelling.  Gastrointestinal: Negative for nausea, vomiting, abdominal pain, diarrhea and constipation.  Endocrine: Negative for cold intolerance, heat intolerance, polydipsia, polyphagia and polyuria.  Skin: Negative for rash.  Neurological: Positive for headaches. Negative for dizziness, tremors, seizures, syncope, facial asymmetry, speech difficulty, weakness, light-headedness and numbness.    Past Medical History  Diagnosis Date  . Depression   . Heart murmur   . Anxiety     parental strain; previous medicaiton; ;followed by therapist once per month; Nan Hyball in [facility].  Marland Kitchen Asthma     Albuterol use prior to exercise.  No hospitalizations or ED visits.  .  Cataracts, bilateral 05/10/2012    s/p B cataract resection.  . Hiatal hernia   . GERD (gastroesophageal reflux disease)   . Hepatitis     Type A as a child  . PONV (postoperative nausea and vomiting)    Past Surgical History  Procedure Laterality Date  . Laparoscopic cholecystectomy  1998  . Tubal ligation  1993  . Parathyroidectomy  1997    3 resected.    . Eye surgery Bilateral 2013, 2014    left; 2013, right 2014  . Retinal detachment surgery Right 2012  . Dilatation & curettage/hysteroscopy with trueclear N/A 02/20/2014    Procedure: DILATATION & CURETTAGE/HYSTEROSCOPY WITH TRUCLEAR;  Surgeon: Meriel Pica, MD;  Location: [facility];  Service: Gynecology;  Laterality: N/A;   Allergies  Allergen Reactions  . Codeine Nausea Only    Can take Percocet with Zofran  . Ibuprofen Other (See Comments)    Irritates stomach due to hiatal hernia   . Shellfish Allergy Nausea Only        Objective:    BP 135/82 mmHg  Pulse 78  Temp(Src) 98.4 F (36.9 C) (Oral)  Resp 18  Ht 5' 5.5" (1.664 m)  Wt 232 lb 9.6 oz (105.507 kg)  BMI 38.10 kg/m2  SpO2 98% Physical Exam  Constitutional: She is oriented to person, place, and time. She appears well-developed and well-nourished. No distress.  HENT:  Head: Normocephalic and atraumatic.  Right Ear: Tympanic membrane, external ear and ear canal normal.  Left Ear: Tympanic membrane, external ear and ear canal  normal.  Nose: Mucosal edema and rhinorrhea present. Right sinus exhibits no maxillary sinus tenderness and no frontal sinus tenderness. Left sinus exhibits no maxillary sinus tenderness and no frontal sinus tenderness.  Mouth/Throat: Oropharynx is clear and moist.  Eyes: Conjunctivae and EOM are normal. Pupils are equal, round, and reactive to light.  Neck: Normal range of motion. Neck supple. Carotid bruit is not present. No thyromegaly present.  Cardiovascular: Normal rate, regular rhythm, normal heart sounds and intact distal  pulses.  Exam reveals no gallop and no friction rub.   No murmur heard. Pulmonary/Chest: Effort normal and breath sounds normal. She has no wheezes. She has no rales.  Abdominal: Soft. Bowel sounds are normal. She exhibits no distension and no mass. There is no tenderness. There is no rebound and no guarding.  Lymphadenopathy:    She has no cervical adenopathy.  Neurological: She is alert and oriented to person, place, and time. No cranial nerve deficit.  Skin: Skin is warm and dry. No rash noted. She is not diaphoretic. No erythema. No pallor.  Psychiatric: She has a normal mood and affect. Her behavior is normal.        Assessment & Plan:   1. Acute upper respiratory infection   2. Asthma with acute exacerbation, mild intermittent     -New. -Rx for Augmentin, Rhinocort Aqua, Tessalon Perles.   -Supportive care with rest, fluids, Ibuprofen or Tylenol. -RTC for acute worsening or development of SOB. -Refill of Albuterol provided; recommend using tid to qid scheduled for one week for mild asthma exacerbation.    Meds ordered this encounter  Medications  . amoxicillin-clavulanate (AUGMENTIN) 875-125 MG per tablet    Sig: Take 1 tablet by mouth 2 (two) times daily.    Dispense:  20 tablet    Refill:  0  . budesonide (RHINOCORT AQUA) 32 MCG/ACT nasal spray    Sig: Place 2 sprays into both nostrils daily.    Dispense:  8.6 g    Refill:  1  . benzonatate (TESSALON) 100 MG capsule    Sig: Take 1-2 capsules (100-200 mg total) by mouth 3 (three) times daily as needed for cough.    Dispense:  40 capsule    Refill:  0  . albuterol (PROVENTIL HFA;VENTOLIN HFA) 108 (90 BASE) MCG/ACT inhaler    Sig: Inhale 2 puffs into the lungs every 6 (six) hours as needed for wheezing or shortness of breath (cough, shortness of breath or wheezing.).    Dispense:  1 Inhaler    Refill:  1    No Follow-up on file.     Shelie Lansing Paulita Fujita, M.D. [facility] [address] [place], [place]  [address] [phone] phone [phone] fax

## 2014-08-21 NOTE — Patient Instructions (Signed)
1.  Continue Robitussin as needed. 2.  Recommend Albuterol every six hours as needed for cough, wheezing.   Upper Respiratory Infection, Adult An upper respiratory infection (URI) is also sometimes known as the common cold. The upper respiratory tract includes the nose, sinuses, throat, trachea, and bronchi. Bronchi are the airways leading to the lungs. Most people improve within 1 week, but symptoms can last up to 2 weeks. A residual cough may last even longer.  CAUSES Many different viruses can infect the tissues lining the upper respiratory tract. The tissues become irritated and inflamed and often become very moist. Mucus production is also common. A cold is contagious. You can easily spread the virus to others by oral contact. This includes kissing, sharing a glass, coughing, or sneezing. Touching your mouth or nose and then touching a surface, which is then touched by another person, can also spread the virus. SYMPTOMS  Symptoms typically develop 1 to 3 days after you come in contact with a cold virus. Symptoms vary from person to person. They may include:  Runny nose.  Sneezing.  Nasal congestion.  Sinus irritation.  Sore throat.  Loss of voice (laryngitis).  Cough.  Fatigue.  Muscle aches.  Loss of appetite.  Headache.  Low-grade fever. DIAGNOSIS  You might diagnose your own cold based on familiar symptoms, since most people get a cold 2 to 3 times a year. Your caregiver can confirm this based on your exam. Most importantly, your caregiver can check that your symptoms are not due to another disease such as strep throat, sinusitis, pneumonia, asthma, or epiglottitis. Blood tests, throat tests, and X-rays are not necessary to diagnose a common cold, but they may sometimes be helpful in excluding other more serious diseases. Your caregiver will decide if any further tests are required. RISKS AND COMPLICATIONS  You may be at risk for a more severe case of the common cold if  you smoke cigarettes, have chronic heart disease (such as heart failure) or lung disease (such as asthma), or if you have a weakened immune system. The very young and very old are also at risk for more serious infections. Bacterial sinusitis, middle ear infections, and bacterial pneumonia can complicate the common cold. The common cold can worsen asthma and chronic obstructive pulmonary disease (COPD). Sometimes, these complications can require emergency medical care and may be life-threatening. PREVENTION  The best way to protect against getting a cold is to practice good hygiene. Avoid oral or hand contact with people with cold symptoms. Wash your hands often if contact occurs. There is no clear evidence that vitamin C, vitamin E, echinacea, or exercise reduces the chance of developing a cold. However, it is always recommended to get plenty of rest and practice good nutrition. TREATMENT  Treatment is directed at relieving symptoms. There is no cure. Antibiotics are not effective, because the infection is caused by a virus, not by bacteria. Treatment may include:  Increased fluid intake. Sports drinks offer valuable electrolytes, sugars, and fluids.  Breathing heated mist or steam (vaporizer or shower).  Eating chicken soup or other clear broths, and maintaining good nutrition.  Getting plenty of rest.  Using gargles or lozenges for comfort.  Controlling fevers with ibuprofen or acetaminophen as directed by your caregiver.  Increasing usage of your inhaler if you have asthma. Zinc gel and zinc lozenges, taken in the first 24 hours of the common cold, can shorten the duration and lessen the severity of symptoms. Pain medicines may help with fever,  muscle aches, and throat pain. A variety of non-prescription medicines are available to treat congestion and runny nose. Your caregiver can make recommendations and may suggest nasal or lung inhalers for other symptoms.  HOME CARE INSTRUCTIONS   Only  take over-the-counter or prescription medicines for pain, discomfort, or fever as directed by your caregiver.  Use a warm mist humidifier or inhale steam from a shower to increase air moisture. This may keep secretions moist and make it easier to breathe.  Drink enough water and fluids to keep your urine clear or pale yellow.  Rest as needed.  Return to work when your temperature has returned to normal or as your caregiver advises. You may need to stay home longer to avoid infecting others. You can also use a face mask and careful hand washing to prevent spread of the virus. SEEK MEDICAL CARE IF:   After the first few days, you feel you are getting worse rather than better.  You need your caregiver's advice about medicines to control symptoms.  You develop chills, worsening shortness of breath, or brown or red sputum. These may be signs of pneumonia.  You develop yellow or brown nasal discharge or pain in the face, especially when you bend forward. These may be signs of sinusitis.  You develop a fever, swollen neck glands, pain with swallowing, or white areas in the back of your throat. These may be signs of strep throat. SEEK IMMEDIATE MEDICAL CARE IF:   You have a fever.  You develop severe or persistent headache, ear pain, sinus pain, or chest pain.  You develop wheezing, a prolonged cough, cough up blood, or have a change in your usual mucus (if you have chronic lung disease).  You develop sore muscles or a stiff neck. Document Released: 10/20/2000 Document Revised: 07/19/2011 Document Reviewed: 08/01/2013 [facility] Patient Information 2015 [place], [place]. This information is not intended to replace advice given to you by your health care provider. Make sure you discuss any questions you have with your health care provider.

## 2014-08-27 NOTE — Op Note (Signed)
PATIENT NAME:  Jasmine Malone, Jasmine Malone MR#:  [id number] DATE OF BIRTH:  Feb 18, 1946  DATE OF PROCEDURE:  05/09/2012  PREOPERATIVE DIAGNOSIS: Visually significant cataract of the left eye.   POSTOPERATIVE DIAGNOSIS: Visually significant cataract of the left eye.   OPERATIVE PROCEDURE: Cataract extraction by phacoemulsification with implant of intraocular lens to left eye.   SURGEON: Birder Robson, MD.   ANESTHESIA:  1. Managed anesthesia care.  2. Topical tetracaine drops followed by 2% Xylocaine jelly applied in the preoperative holding area.   COMPLICATIONS: None.   TECHNIQUE:  Stop and chop.   DESCRIPTION OF PROCEDURE: The patient was examined and consented in the preoperative holding area where the aforementioned topical anesthesia was applied to the left eye.  The patient was brought back to the Operating Room where he was sat upright on the gurney and given a target to fixate upon while the eye was marked at the 3:00 and 9:00 position.  The patient was then reclined on the operating table, and lidocaine jelly was applied to the left eye.  The eye was prepped and draped in the usual sterile ophthalmic fashion and a lid speculum was placed. A paracentesis was created with the side port blade and the anterior chamber was filled with viscoelastic. A near clear corneal incision was performed with the steel keratome. A continuous curvilinear capsulorrhexis was performed with a cystotome followed by the capsulorrhexis forceps. Hydrodissection and hydrodelineation were carried out with BSS on a blunt cannula. The lens was removed in a stop and chop technique and the remaining cortical material was removed with the irrigation-aspiration handpiece. The eye was inflated with viscoelastic and the Tecnis ZCT300 09.0-diopter lens, serial number [id number]  was placed in the eye and rotated to within a few degrees of the predetermined orientation at 112 degrees.  The remaining viscoelastic was removed from the  eye.  The Sinskey hook was used to rotate the toric lens into its final resting place at 112  degrees.  The eye was inflated to a physiologic pressure and found to be watertight.  The eye was dressed with Vigamox. The patient was given protective glasses to wear throughout the day and a shield with which to sleep tonight. The patient was also given drops with which to begin a drop regimen today and will follow-up with me in one day.  ____________________________ Livingston Diones. Vikki Gains, MD wlp:sb D: 05/09/2012 13:36:54 ET T: 05/09/2012 14:02:29 ET JOB#: [id number]  cc: Gregorio Worley L. Chevon Laufer, MD, <Dictator> Livingston Diones Sophonie Goforth MD ELECTRONICALLY SIGNED 05/09/2012 16:29

## 2014-08-30 NOTE — Op Note (Signed)
PATIENT NAME:  Schloemer, DEMETRIC PARSLOW MR#:  [id number] DATE OF BIRTH:  03/14/1946  DATE OF PROCEDURE:  07/04/2012  PREOPERATIVE DIAGNOSIS: Visually significant cataract of the right eye.   POSTOPERATIVE DIAGNOSIS: Visually significant cataract of the right eye.   OPERATIVE PROCEDURE: Cataract extraction by phacoemulsification with implant of intraocular lens to right eye.   SURGEON: Birder Robson, MD.   ANESTHESIA:  1. Managed anesthesia care.  2. Topical tetracaine drops followed by 2% Xylocaine jelly applied in the preoperative holding area.   COMPLICATIONS: None.   TECHNIQUE:stop and chop   DESCRIPTION OF PROCEDURE: The patient was examined and consented in the preoperative holding area where the aforementioned topical anesthesia was applied to the right eye.  The patient was brought back to the Operating Room where he was sat upright on the gurney and given a target to fixate upon while the eye was marked at the 3:00 and 9:00 position.  The patient was then reclined on the operating table, and lidocaine jelly was applied to the right eye.  The eye was prepped and draped in the usual sterile ophthalmic fashion and a lid speculum was placed. A paracentesis was created with the side port blade and the anterior chamber was filled with viscoelastic. A near clear corneal incision was performed with the steel keratome. A continuous curvilinear capsulorrhexis was performed with a cystotome followed by the capsulorrhexis forceps. Hydrodissection and hydrodelineation were carried out with BSS on a blunt cannula. The lens was removed in a stop and chop technique and the remaining cortical material was removed with the irrigation-aspiration handpiece. The eye was inflated with viscoelastic and the ZCT300 10.0-diopter lens, serial number [id number] was placed in the eye and rotated to within a few degrees of the predetermined orientation at 081 degrees.  The remaining viscoelastic was removed from the eye.  The  Sinskey hook was used to rotate the toric lens into its final resting place at 081 degrees.  0.1 mL of cefuroxime concentration 10 mg/mL was placed in the anterior chamber. The eye was inflated to a physiologic pressure and found to be watertight.  The eye was dressed with Vigamox. The patient was given protective glasses to wear throughout the day and a shield with which to sleep tonight. The patient was also given drops with which to begin a drop regimen today and will follow-up with me in one day.  ____________________________ Livingston Diones. Babak Lucus, MD wlp:sb D: 07/04/2012 14:07:16 ET T: 07/04/2012 14:41:37 ET JOB#: [id number]  cc: Tionne Dayhoff L. Diamon Reddinger, MD, <Dictator> Livingston Diones Voncile Schwarz MD ELECTRONICALLY SIGNED 07/21/2012 17:10

## 2014-08-31 NOTE — Op Note (Signed)
PATIENT NAME:  Jasmine Malone, Jasmine Malone MR#:  [id number] DATE OF BIRTH:  [birth year]  DATE OF PROCEDURE:  09/06/2013  PROCEDURES PERFORMED:  1.  Pars plana vitrectomy of the left eye.  2.  Retinal detachment repair of the left eye.  3.  Gas exchange of the left eye.  4.  Endolaser, left eye.   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  ESTIMATED BLOOD LOSS: Less than 1 mL.  PRIMARY SURGEON: Aron Baba, M.D.   ANESTHESIA: Retrobulbar block of the left eye with monitored anesthesia care.   INDICATIONS FOR PROCEDURE: The patient presented to my office with an increasing curtain in her left eye. Examination revealed a macula on rhegmatogenous retinal detachment in the superotemporal quadrant. It was past the arcades. Risks, benefits, and alternatives of the above procedure were discussed and the patient wished to proceed.   DETAILS OF PROCEDURE: After informed consent was obtained, the patient was brought to the operative suite at [facility]. The patient was placed in supine position, given a Lohmann dose of Alfenta and a retrobulbar block was performed on the left eye by the primary surgeon without any complications. The left eye was prepped and draped in sterile manner. After lid speculum was inserted, a 25-gauge trocar was placed inferotemporally through displaced conjunctiva in an oblique fashion 3 mm beyond the limbus. The infusion cannula was turned on and inserted through the trocar and secured in position with Steri-Strips. Two more trocars were placed in a similar fashion superotemporally and superonasally. The vitreous cutter and light pipe were introduced in the eye and a core vitrectomy was performed. Vitreous face was confirmed as already elevated and was trimmed for 360 degrees out to the vitreous base. Extreme care was taken over the retinal detachment  in shave mode in order to avoid incarceration of retina and further holes. Two Geller tears were noted at approximately 2-o'clock, one anterior to the other. The vitreous base was noted to be posteriorly inserted and extremely adherent somewhat posterior to the ora serrata. Once trimming of the vitreous was completed, a posterior draining retinotomy was created superotemporal to the superotemporal arcade at approximately 1:30 at the level of the equator. An air-fluid exchange was performed through this draining retinotomy and the retina completely flattened.   Endolaser was introduced and 4 rows of laser were placed around each of the retinal tears. A demarcation line was noted and laser was carried to the demarcation line and 4 rows of laser were placed around the draining retinotomy. Given the posterior insertion of the vitreous base, laser was introduced from the ora serrata for 360 degrees to the posterior aspect of the vitreous base insertion. Once this was completed, any remnant fluid was removed. SF6 22% was used as an air gas exchange and the trocars were removed. The wounds were noted to be airtight. Pressure in the eye was confirmed to be approximately 15 mmHg. Dexamethasone 5 mg was given into the inferior fornix. The lid speculum was removed and the eye was cleaned. TobraDex was placed in the eye and a patch and shield were placed over the eye. The patient was taken to postanesthesia care with instruction to remain on her left side for 1 hour, followed by laying on her right side for the next 2 weeks.    ____________________________ Ignacia Felling. Champ Mungo, MD mfa:aw D: 09/06/2013 08:03:14 ET T: 09/06/2013 08:10:36 ET JOB#: [id number]  cc: Molli Hazard  Jacinta Shoe, MD, <Dictator> Cline Cools MD ELECTRONICALLY SIGNED 10/03/2013 9:33

## 2014-09-06 ENCOUNTER — Telehealth: Payer: Self-pay

## 2014-09-06 MED ORDER — PROMETHAZINE-DM 6.25-15 MG/5ML PO SYRP: 5.0000 mL | ORAL_SOLUTION | Freq: Four times a day (QID) | ORAL | Status: DC | PRN

## 2014-09-06 NOTE — Telephone Encounter (Signed)
Informed pt that rx had been called in.

## 2014-09-06 NOTE — Telephone Encounter (Signed)
Pt states that her cough has gotten worse, and would like to know if Dr. Tamala Julian can call her something stronger than the benzonatate (TESSALON) 100 MG capsule [[id number]] . Please advise

## 2014-09-06 NOTE — Telephone Encounter (Signed)
Meds ordered this encounter  Medications  . promethazine-dextromethorphan (PROMETHAZINE-DM) 6.25-15 MG/5ML syrup    Sig: Take 5 mLs by mouth 4 (four) times daily as needed for cough.    Dispense:  118 mL    Refill:  0    Order Specific Question:  Supervising Provider    Answer:  DOOLITTLE, ROBERT P [[id number]]

## 2014-09-06 NOTE — Telephone Encounter (Signed)
Can we rx her anything stronger?

## 2014-09-25 ENCOUNTER — Ambulatory Visit (INDEPENDENT_AMBULATORY_CARE_PROVIDER_SITE_OTHER): Payer: PRIVATE HEALTH INSURANCE | Admitting: Family Medicine

## 2014-09-25 ENCOUNTER — Encounter: Payer: Self-pay | Admitting: Family Medicine

## 2014-09-25 ENCOUNTER — Other Ambulatory Visit: Payer: Self-pay | Admitting: Family Medicine

## 2014-09-25 VITALS — BP 125/82 | HR 76 | Temp 98.4°F | Resp 16 | Ht 66.0 in | Wt 230.6 lb

## 2014-09-25 DIAGNOSIS — Z23 Encounter for immunization: Secondary | ICD-10-CM

## 2014-09-25 DIAGNOSIS — E7800 Pure hypercholesterolemia, unspecified: Secondary | ICD-10-CM

## 2014-09-25 DIAGNOSIS — E2839 Other primary ovarian failure: Secondary | ICD-10-CM

## 2014-09-25 DIAGNOSIS — Z124 Encounter for screening for malignant neoplasm of cervix: Secondary | ICD-10-CM | POA: Diagnosis not present

## 2014-09-25 DIAGNOSIS — Z0000 Encounter for general adult medical examination without abnormal findings: Secondary | ICD-10-CM

## 2014-09-25 DIAGNOSIS — R87610 Atypical squamous cells of undetermined significance on cytologic smear of cervix (ASC-US): Secondary | ICD-10-CM

## 2014-09-25 DIAGNOSIS — Z6837 Body mass index (BMI) 37.0-37.9, adult: Secondary | ICD-10-CM

## 2014-09-25 DIAGNOSIS — Z1231 Encounter for screening mammogram for malignant neoplasm of breast: Secondary | ICD-10-CM | POA: Diagnosis not present

## 2014-09-25 DIAGNOSIS — Z01419 Encounter for gynecological examination (general) (routine) without abnormal findings: Secondary | ICD-10-CM | POA: Diagnosis not present

## 2014-09-25 DIAGNOSIS — J301 Allergic rhinitis due to pollen: Secondary | ICD-10-CM

## 2014-09-25 DIAGNOSIS — Z131 Encounter for screening for diabetes mellitus: Secondary | ICD-10-CM

## 2014-09-25 LAB — COMPREHENSIVE METABOLIC PANEL
ALBUMIN: 3.8 g/dL (ref 3.5–5.2)
ALT: 25 U/L (ref 0–35)
AST: 25 U/L (ref 0–37)
Alkaline Phosphatase: 89 U/L (ref 39–117)
BUN: 10 mg/dL (ref 6–23)
CALCIUM: 9.8 mg/dL (ref 8.4–10.5)
CHLORIDE: 106 meq/L (ref 96–112)
CO2: 28 mEq/L (ref 19–32)
Creat: 0.74 mg/dL (ref 0.50–1.10)
GLUCOSE: 90 mg/dL (ref 70–99)
POTASSIUM: 4.6 meq/L (ref 3.5–5.3)
Sodium: 144 mEq/L (ref 135–145)
Total Bilirubin: 0.6 mg/dL (ref 0.2–1.2)
Total Protein: 7.4 g/dL (ref 6.0–8.3)

## 2014-09-25 LAB — LIPID PANEL
CHOL/HDL RATIO: 3.3 ratio
Cholesterol: 183 mg/dL (ref 0–200)
HDL: 55 mg/dL (ref >=46)
LDL Cholesterol: 97 mg/dL (ref 0–99)
Triglycerides: 157 mg/dL — ABNORMAL HIGH (ref <150)
VLDL: 31 mg/dL (ref 0–40)

## 2014-09-25 MED ORDER — ZOSTER VACCINE LIVE 19400 UNT/0.65ML ~~LOC~~ SOLR: 0.6500 mL | Freq: Once | SUBCUTANEOUS | Status: DC

## 2014-09-25 NOTE — Progress Notes (Signed)
Subjective:    Patient ID: Jasmine Malone, female    DOB: [birth year], 69 y.o.   MRN: [id number]  HPI    Review of Systems  Constitutional: Negative.   HENT: Positive for postnasal drip, rhinorrhea, sinus pressure, sneezing, sore throat and trouble swallowing.   Eyes: Positive for photophobia and discharge.  Respiratory: Positive for wheezing.   Cardiovascular: Negative.   Gastrointestinal: Positive for nausea, abdominal pain and diarrhea.  Endocrine: Negative.   Genitourinary: Negative.   Musculoskeletal: Negative.   Skin: Negative.   Allergic/Immunologic: Positive for environmental allergies.  Neurological: Negative.   Hematological: Negative.   Psychiatric/Behavioral: Negative.        Objective:   Physical Exam        Assessment & Plan:

## 2014-09-25 NOTE — Patient Instructions (Signed)

## 2014-09-25 NOTE — Progress Notes (Signed)
Subjective:    Patient ID: Jasmine Malone, female    DOB: February 13, 1946, 69 y.o.   MRN: [id number]  09/25/2014  Annual Exam and Mass   HPI This 69 y.o. female presents for Complete Physical Examination.  Last physical:  04-25-2013 Pap smear:  04-25-2013; no further post-menopausal bleeding; s /p sonohysterogram. Mammogram: overdue; years ago.   Colonoscopy:  08-02-2013 +polyps; Erick Blinks, MD.  Repeat in 3 years. Bone density:  never TDAP:  2013 Pneumovax:  2013 Zostavax: never; no previous chicken pox. Influenza: 04/25/2013; 2015 at [facility]. Eye exam:  Frequently in past six months.  S/p oil retraction; vision has improved. Dental exam:  Overdue.   Hypercholesterolemia:  LDL of 118 at CPE in 2014.    Scratchy throat, pain with swallowing:  Onset in past week; no fever/chills/sweats.  No headache, ear pain.  +scratchy throat intermittent; +intermittent pain with swallowing.  +nasal congestion; +rhinorrhea; +cough; +PND.  Using Flonase intermittently; does not use daily due to bad taste.  No sneezing.  Post-menopausal bleeding: s/p D&C with hysteroscopy by Dr. Marcelle Overlie; no further bleeding; pathology negative. No pap smear obtained by [place] that patient recalls.   Review of Systems  Constitutional: Negative for fever, chills, diaphoresis, activity change, appetite change, fatigue and unexpected weight change.  HENT: Positive for congestion, postnasal drip, sore throat, trouble swallowing and voice change. Negative for dental problem, drooling, ear discharge, ear pain, facial swelling, hearing loss, mouth sores, nosebleeds, rhinorrhea, sinus pressure, sneezing and tinnitus.   Eyes: Negative for photophobia, pain, discharge, redness, itching and visual disturbance.  Respiratory: Positive for cough. Negative for apnea, choking, chest tightness, shortness of breath, wheezing and stridor.   Cardiovascular: Negative for chest pain, palpitations and leg swelling.  Gastrointestinal: Negative  for nausea, vomiting, abdominal pain, diarrhea, constipation, blood in stool, abdominal distention, anal bleeding and rectal pain.  Endocrine: Negative for cold intolerance, heat intolerance, polydipsia, polyphagia and polyuria.  Genitourinary: Negative for dysuria, urgency, frequency, hematuria, flank pain, decreased urine volume, vaginal bleeding, vaginal discharge, enuresis, difficulty urinating, genital sores, vaginal pain, menstrual problem, pelvic pain and dyspareunia.       Incontinence/leakage with cough or sneezing; rare urge incontinence.  Musculoskeletal: Negative for myalgias, back pain, joint swelling, arthralgias, gait problem, neck pain and neck stiffness.  Skin: Negative for color change, pallor, rash and wound.  Allergic/Immunologic: Negative for environmental allergies, food allergies and immunocompromised state.  Neurological: Negative for dizziness, tremors, seizures, syncope, facial asymmetry, speech difficulty, weakness, light-headedness, numbness and headaches.  Hematological: Negative for adenopathy. Does not bruise/bleed easily.  Psychiatric/Behavioral: Negative for suicidal ideas, hallucinations, behavioral problems, confusion, sleep disturbance, self-injury, dysphoric mood, decreased concentration and agitation. The patient is not nervous/anxious and is not hyperactive.     Past Medical History  Diagnosis Date  . Depression   . Heart murmur   . Anxiety     parental strain; previous medicaiton; ;followed by therapist once per month; Nan Hyball in [facility].  Marland Kitchen Asthma     Albuterol use prior to exercise.  No hospitalizations or ED visits.  . Cataracts, bilateral 05/10/2012    s/p B cataract resection.  . Hiatal hernia   . GERD (gastroesophageal reflux disease)   . Hepatitis     Type A as a child  . PONV (postoperative nausea and vomiting)   . Arthritis   . Parathyroid abnormality    Past Surgical History  Procedure Laterality Date  . Laparoscopic cholecystectomy  1998    . Tubal ligation  1993  .  Parathyroidectomy  1997    3 resected.    . Eye surgery Bilateral 2013, 2014    left; 2013, right 2014  . Retinal detachment surgery Right 2012  . Dilatation & curettage/hysteroscopy with trueclear N/A 02/20/2014    Procedure: DILATATION & CURETTAGE/HYSTEROSCOPY WITH TRUCLEAR;  Surgeon: Meriel Pica, MD;  Location: [facility];  Service: Gynecology;  Laterality: N/A;   Allergies  Allergen Reactions  . Codeine Nausea Only    Can take Percocet with Zofran  . Ibuprofen Other (See Comments)    Irritates stomach due to hiatal hernia   . Shellfish Allergy Nausea Only   Current Outpatient Prescriptions  Medication Sig Dispense Refill  . albuterol (PROVENTIL HFA;VENTOLIN HFA) 108 (90 BASE) MCG/ACT inhaler Inhale 2 puffs into the lungs every 6 (six) hours as needed for wheezing or shortness of breath (cough, shortness of breath or wheezing.). 1 Inhaler 1  . budesonide (RHINOCORT AQUA) 32 MCG/ACT nasal spray Place 2 sprays into both nostrils daily. 8.6 g 1  . FENUGREEK PO Take 1 capsule by mouth daily as needed (for congestion).    Stephanie Acre (MSM GLUCOSAMINE COMPLEX PO) Take 1 capsule by mouth daily.     . Multiple Vitamin (MULTIVITAMIN WITH MINERALS) TABS tablet Take 1 tablet by mouth daily.    . Omega-3 Fatty Acids (FISH OIL PO) Take 2 capsules by mouth daily.     . prednisoLONE acetate (PRED FORTE) 1 % ophthalmic suspension Place 1 drop into the left eye 4 (four) times daily.    . promethazine-dextromethorphan (PROMETHAZINE-DM) 6.25-15 MG/5ML syrup Take 5 mLs by mouth 4 (four) times daily as needed for cough. 118 mL 0  . Red Yeast Rice Extract (RED YEAST RICE PO) Take 1 capsule by mouth daily.    Marland Kitchen oxycodone-acetaminophen (PERCOCET) 2.5-325 MG per tablet Take 1 tablet by mouth every 4 (four) hours as needed for pain. (Patient not taking: Reported on 08/21/2014) 30 tablet 0  . zoster vaccine live, PF, (ZOSTAVAX) 16109 UNT/0.65ML injection Inject  19,400 Units into the skin once. 0.65 mL 0   No current facility-administered medications for this visit.       Objective:    BP 125/82 mmHg  Pulse 76  Temp(Src) 98.4 F (36.9 C) (Oral)  Resp 16  Ht 5\' 6"  (1.676 m)  Wt 230 lb 9.6 oz (104.599 kg)  BMI 37.24 kg/m2  SpO2 97% Physical Exam  Constitutional: She is oriented to person, place, and time. She appears well-developed and well-nourished. No distress.  Obese  HENT:  Head: Normocephalic and atraumatic.  Right Ear: External ear normal.  Left Ear: External ear normal.  Nose: Nose normal.  Mouth/Throat: Oropharynx is clear and moist.  Drainage in OP.  Eyes: Conjunctivae and EOM are normal. Pupils are equal, round, and reactive to light.  Neck: Normal range of motion and full passive range of motion without pain. Neck supple. No JVD present. Carotid bruit is not present. No thyromegaly present.  Well-healed horizontal scar anterior lower neck.  Cardiovascular: Normal rate, regular rhythm and normal heart sounds.  Exam reveals no gallop and no friction rub.   No murmur heard. Pulmonary/Chest: Effort normal and breath sounds normal. She has no wheezes. She has no rales. Right breast exhibits no inverted nipple, no mass, no nipple discharge, no skin change and no tenderness. Left breast exhibits no inverted nipple, no mass, no nipple discharge, no skin change and no tenderness. Breasts are symmetrical.  Abdominal: Soft. Bowel sounds are normal. She exhibits no  distension and no mass. There is no tenderness. There is no rebound and no guarding.  Genitourinary: Vagina normal and uterus normal. There is no rash, tenderness or lesion on the right labia. There is no rash, tenderness or lesion on the left labia. Cervix exhibits no motion tenderness, no discharge and no friability. Right adnexum displays no mass, no tenderness and no fullness. Left adnexum displays no mass, no tenderness and no fullness. No erythema or bleeding in the vagina.  No foreign body around the vagina. No signs of injury around the vagina. No vaginal discharge found.  Musculoskeletal:       Right shoulder: Normal.       Left shoulder: Normal.       Cervical back: Normal.  Lymphadenopathy:    She has no cervical adenopathy.  Neurological: She is alert and oriented to person, place, and time. She has normal reflexes. No cranial nerve deficit. She exhibits normal muscle tone. Coordination normal.  Skin: Skin is warm and dry. No rash noted. She is not diaphoretic. No erythema. No pallor.  Psychiatric: She has a normal mood and affect. Her behavior is normal. Judgment and thought content normal.  Nursing note and vitals reviewed.  Results for orders placed or performed in visit on 09/25/14  Comprehensive metabolic panel  Result Value Ref Range   Sodium 144 135 - 145 mEq/L   Potassium 4.6 3.5 - 5.3 mEq/L   Chloride 106 96 - 112 mEq/L   CO2 28 19 - 32 mEq/L   Glucose, Bld 90 70 - 99 mg/dL   BUN 10 6 - 23 mg/dL   Creat 4.09 8.11 - 9.14 mg/dL   Total Bilirubin 0.6 0.2 - 1.2 mg/dL   Alkaline Phosphatase 89 39 - 117 U/L   AST 25 0 - 37 U/L   ALT 25 0 - 35 U/L   Total Protein 7.4 6.0 - 8.3 g/dL   Albumin 3.8 3.5 - 5.2 g/dL   Calcium 9.8 8.4 - 78.2 mg/dL  Lipid panel  Result Value Ref Range   Cholesterol 183 0 - 200 mg/dL   Triglycerides 956 (H) <150 mg/dL   HDL 55 >=21 mg/dL   Total CHOL/HDL Ratio 3.3 Ratio   VLDL 31 0 - 40 mg/dL   LDL Cholesterol 97 0 - 99 mg/dL   [id number] ADMINISTERED.    Assessment & Plan:   1. Encounter for Medicare annual wellness exam   2. Encounter for routine gynecological examination   3. Cervical cancer screening   4. Encounter for screening mammogram for breast cancer   5. Need for prophylactic vaccination against Streptococcus pneumoniae (pneumococcus)   6. Estrogen deficiency   7. Pure hypercholesterolemia   8. Screening for diabetes mellitus   9. Allergic rhinitis due to pollen   10. BMI 37.0-37.9, adult      1.  Annual Wellness Examination: anticipatory guidance --- exercise, weight loss, ASA 81mg  daily, 3 servings of dairy daily.  Pap smear obtained; refer for mammogram and bone density scan. Colonoscopy UTD. Immunizations reviewed -- s/p Prevnar 13; rx for Zostavax provided.  Emotionally stable; depression not worsening. No evidence of hearing loss. Low fall risk.  Independent with ADLs.  No advanced directives; encourage pt to complete advanced directives in upcoming year. 2.  Gynecological exam: pap smear obtained; refer for mammogram. Menopausal. 3.  S/p Prevnar 13. 4.  Estrogen deficiency: refer for DEXA; recommend regular exercise and weight bearing exercise. Recommend 3 servings of dairy daily. 5.  Hypercholesterolemia: stable; repeat  labs; recommend weight loss, exercise, low-fat and low-cholesterol food choices. 6.  Screening DMII: obtain glucose. 7.  Allergic Rhinitis: uncontrolled; increase Flonase to daily use. 8.  BMI 37.0/obesity: recommend weight loss, exercise, low-fat and low-caloric food choices.    Meds ordered this encounter  Medications  . zoster vaccine live, PF, (ZOSTAVAX) 25366 UNT/0.65ML injection    Sig: Inject 19,400 Units into the skin once.    Dispense:  0.65 mL    Refill:  0    Return in about 1 year (around 09/25/2015) for complete physical examiniation.     Zyniah Ferraiolo Paulita Fujita, M.D. [facility] [address] [place], [place]  [address] [phone] phone [phone] fax

## 2014-09-26 DIAGNOSIS — F341 Dysthymic disorder: Secondary | ICD-10-CM | POA: Diagnosis not present

## 2014-09-27 LAB — PAP IG (IMAGE GUIDED)

## 2014-10-03 NOTE — Addendum Note (Signed)
Addended by: Constance Goltz on: 10/03/2014 09:53 AM   Modules accepted: Miquel Dunn

## 2014-10-03 NOTE — Addendum Note (Signed)
Addended by: Constance Goltz on: 10/03/2014 10:03 AM   Modules accepted: Miquel Dunn

## 2014-10-04 LAB — HUMAN PAPILLOMAVIRUS, HIGH RISK: HPV DNA HIGH RISK: NOT DETECTED

## 2014-10-29 DIAGNOSIS — F341 Dysthymic disorder: Secondary | ICD-10-CM | POA: Diagnosis not present

## 2014-11-06 DIAGNOSIS — F341 Dysthymic disorder: Secondary | ICD-10-CM | POA: Diagnosis not present

## 2014-12-01 DIAGNOSIS — F341 Dysthymic disorder: Secondary | ICD-10-CM | POA: Diagnosis not present

## 2014-12-03 DIAGNOSIS — F341 Dysthymic disorder: Secondary | ICD-10-CM | POA: Diagnosis not present

## 2014-12-16 DIAGNOSIS — H04123 Dry eye syndrome of bilateral lacrimal glands: Secondary | ICD-10-CM | POA: Diagnosis not present

## 2014-12-16 DIAGNOSIS — H3322 Serous retinal detachment, left eye: Secondary | ICD-10-CM | POA: Diagnosis not present

## 2014-12-16 DIAGNOSIS — H3522 Other non-diabetic proliferative retinopathy, left eye: Secondary | ICD-10-CM | POA: Diagnosis not present

## 2014-12-26 DIAGNOSIS — F341 Dysthymic disorder: Secondary | ICD-10-CM | POA: Diagnosis not present

## 2014-12-30 ENCOUNTER — Telehealth: Payer: Self-pay | Admitting: *Deleted

## 2014-12-30 NOTE — Telephone Encounter (Signed)
Phoned [facility] (where patient was supposed to be having mammogram completed) and spoke with [facility] in radiology dept.  As of this time, she did not see any results, but could see orders.  Advised to call back tomorrow & speak with the [name] dept [phone]).

## 2014-12-31 NOTE — Telephone Encounter (Signed)
Attempted to phone the [facility] [place], no answer, will attempt again later.

## 2014-12-31 NOTE — Telephone Encounter (Signed)
Phoned [facility], where they advised me to contact the [facility] [place] at [phone]

## 2015-01-01 NOTE — Telephone Encounter (Signed)
Spoke with Jasmine Malone in [facility] [place] who confirmed that the scans had not been done, that it was the responsibility of the patient to call & schedule.  Advised I would attempt to reach patient to notify her.  Attempted to reach patient at her home number & machine memory was too full to leave a message.  Phoned mobile number, reached & spoke with patient, and she was thinking someone from either our facility or rad facility would contact her to schedule.  I clarified that misconception and provided her with their contact information, as well as offering to call them back for her.  Patient stated she would contact them tomorrow.

## 2015-01-30 DIAGNOSIS — F341 Dysthymic disorder: Secondary | ICD-10-CM | POA: Diagnosis not present

## 2015-02-24 DIAGNOSIS — H3322 Serous retinal detachment, left eye: Secondary | ICD-10-CM | POA: Diagnosis not present

## 2015-02-24 DIAGNOSIS — H3522 Other non-diabetic proliferative retinopathy, left eye: Secondary | ICD-10-CM | POA: Diagnosis not present

## 2015-02-25 DIAGNOSIS — F341 Dysthymic disorder: Secondary | ICD-10-CM | POA: Diagnosis not present

## 2015-03-27 DIAGNOSIS — H5319 Other subjective visual disturbances: Secondary | ICD-10-CM | POA: Diagnosis not present

## 2015-03-27 DIAGNOSIS — Z8669 Personal history of other diseases of the nervous system and sense organs: Secondary | ICD-10-CM | POA: Diagnosis not present

## 2015-03-27 DIAGNOSIS — H35352 Cystoid macular degeneration, left eye: Secondary | ICD-10-CM | POA: Diagnosis not present

## 2015-04-08 ENCOUNTER — Encounter: Payer: Self-pay | Admitting: Physician Assistant

## 2015-04-08 ENCOUNTER — Ambulatory Visit (INDEPENDENT_AMBULATORY_CARE_PROVIDER_SITE_OTHER): Payer: Managed Care, Other (non HMO) | Admitting: Physician Assistant

## 2015-04-08 VITALS — BP 132/82 | HR 76 | Temp 98.0°F | Resp 16 | Wt 232.0 lb

## 2015-04-08 DIAGNOSIS — J019 Acute sinusitis, unspecified: Secondary | ICD-10-CM

## 2015-04-08 DIAGNOSIS — J4520 Mild intermittent asthma, uncomplicated: Secondary | ICD-10-CM | POA: Diagnosis not present

## 2015-04-08 MED ORDER — BENZONATATE 100 MG PO CAPS: 100.0000 mg | ORAL_CAPSULE | Freq: Three times a day (TID) | ORAL | Status: DC | PRN

## 2015-04-08 MED ORDER — ALBUTEROL SULFATE HFA 108 (90 BASE) MCG/ACT IN AERS: 2.0000 | INHALATION_SPRAY | Freq: Four times a day (QID) | RESPIRATORY_TRACT | Status: DC | PRN

## 2015-04-08 MED ORDER — CEFDINIR 300 MG PO CAPS: 600.0000 mg | ORAL_CAPSULE | Freq: Every day | ORAL | Status: DC

## 2015-04-08 NOTE — Progress Notes (Signed)
Patient ID: Jasmine Malone, female    DOB: May 10, 1946, 69 y.o.   MRN: [id number]  PCP: Nilda Simmer, MD  Subjective:   Chief Complaint  Patient presents with  . Cough    2 weeks--productive, green  . runny nose    HPI Presents for evaluation of cough, rhinorrhea, HA, and sinus pain x 3 weeks.   Cough and rhinorrhea started together, followed by HA and sinus pain, predominantly in her maxillary sinuses. Cough is productive with thick clear-yellow mucous that is hard to get up. Cough occurs throughout the day and is accompanied by mild SOB with lying down and activities like walking. Has gradually gotten worse. Nasal drainage is clear. Complained of mild chills and sore throat during the first week of her illness, but these have since resolved. Denies fever, N/V, ear pain, tinnitus, nasal congestion, or itchy, watery eyes.   Has tried Mucinex intermittently as well as Fenugreek natural decongestant without much relief. Takes care of 3 grandchildren at home who have all been sick recently.   If she needs an ABX for her symptoms, requests something that will be easy on her stomach. Has used Augmentin in the past and had diarrhea for several weeks.   Has a PMH of asthma and would like a refill on her albuterol inhaler today. Ran out a couple months ago. Was using approximately once a month with good control of her asthma.  Has not been able to get her flu shot this year d/t her recent illness.    Review of Systems Constitutional: Positive for chills. Negative for fever, activity change, appetite change, fatigue and unexpected weight change.  HENT: Positive for postnasal drip, rhinorrhea, sinus pressure ("both of my cheeks are sore") and sore throat. Negative for congestion, ear pain and tinnitus.  Eyes: Negative for itching and visual disturbance.  Recently had left eye surgery for retinal detachment. Uses drops now for dry eyes.  Respiratory: Positive for cough and shortness of breath.  Negative for chest tightness and wheezing.  Cardiovascular: Negative for chest pain and palpitations.  Gastrointestinal: Negative for nausea, vomiting, abdominal pain and diarrhea.  Musculoskeletal: Negative for myalgias.  Neurological: Positive for headaches. Negative for dizziness, weakness and light-headedness.      Patient Active Problem List   Diagnosis Date Noted  . Post-menopausal bleeding 02/13/2014     Prior to Admission medications   Medication Sig Start Date End Date Taking? Authorizing Provider  FENUGREEK PO Take 1 capsule by mouth daily as needed (for congestion).   Yes Historical Provider, MD  Glucos-MSM-C-Mn-Ginger-Willow (MSM GLUCOSAMINE COMPLEX PO) Take 1 capsule by mouth daily.    Yes Historical Provider, MD  Multiple Vitamin (MULTIVITAMIN WITH MINERALS) TABS tablet Take 1 tablet by mouth daily.   Yes Historical Provider, MD  Omega-3 Fatty Acids (FISH OIL PO) Take 2 capsules by mouth daily.    Yes Historical Provider, MD  Red Yeast Rice Extract (RED YEAST RICE PO) Take 1 capsule by mouth daily.   Yes Historical Provider, MD  albuterol (PROVENTIL HFA;VENTOLIN HFA) 108 (90 BASE) MCG/ACT inhaler Inhale 2 puffs into the lungs every 6 (six) hours as needed for wheezing or shortness of breath (cough, shortness of breath or wheezing.). 08/21/14   Ethelda Chick, MD  budesonide (RHINOCORT AQUA) 32 MCG/ACT nasal spray Place 2 sprays into both nostrils daily. Patient not taking: Reported on 04/08/2015 08/21/14   Ethelda Chick, MD  zoster vaccine live, PF, (ZOSTAVAX) 08657 UNT/0.65ML injection Inject 19,400 Units into the  skin once. Patient not taking: Reported on 04/08/2015 09/25/14   Ethelda Chick, MD     Allergies  Allergen Reactions  . Codeine Nausea Only    Can take Percocet with Zofran  . Ibuprofen Other (See Comments)    Irritates stomach due to hiatal hernia   . Shellfish Allergy Nausea Only       Objective:  Physical Exam  Constitutional: She is oriented to  person, place, and time. Vital signs are normal. She appears well-developed and well-nourished. She is active and cooperative. No distress.  BP 132/82 mmHg  Pulse 76  Temp(Src) 98 F (36.7 C) (Oral)  Resp 16  Wt 232 lb (105.235 kg)   HENT:  Head: Normocephalic and atraumatic.  Right Ear: Hearing, tympanic membrane, external ear and ear canal normal.  Left Ear: Hearing, tympanic membrane, external ear and ear canal normal.  Nose: Mucosal edema and rhinorrhea present.  No foreign bodies. Right sinus exhibits maxillary sinus tenderness. Right sinus exhibits no frontal sinus tenderness. Left sinus exhibits maxillary sinus tenderness. Left sinus exhibits no frontal sinus tenderness.  Mouth/Throat: Uvula is midline, oropharynx is clear and moist and mucous membranes are normal. No uvula swelling. No oropharyngeal exudate.  Eyes: Conjunctivae and EOM are normal. Pupils are equal, round, and reactive to light. Right eye exhibits no discharge. Left eye exhibits no discharge. No scleral icterus.  Neck: Trachea normal, normal range of motion and full passive range of motion without pain. Neck supple. No thyroid mass and no thyromegaly present.  Cardiovascular: Normal rate, regular rhythm and normal heart sounds.   Pulmonary/Chest: Effort normal and breath sounds normal.  Lymphadenopathy:       Head (right side): No submandibular, no tonsillar, no preauricular, no posterior auricular and no occipital adenopathy present.       Head (left side): No submandibular, no tonsillar, no preauricular and no occipital adenopathy present.    She has no cervical adenopathy.       Right: No supraclavicular adenopathy present.       Left: No supraclavicular adenopathy present.  Neurological: She is alert and oriented to person, place, and time. She has normal strength. No cranial nerve deficit or sensory deficit.  Skin: Skin is warm, dry and intact. No rash noted.  Psychiatric: She has a normal mood and affect. Her  speech is normal and behavior is normal.           Assessment & Plan:   1. Subacute sinusitis, unspecified location Supportive care. Anticipatory guidance provided. - cefdinir (OMNICEF) 300 MG capsule; Take 2 capsules (600 mg total) by mouth daily.  Dispense: 20 capsule; Refill: 0 - benzonatate (TESSALON) 100 MG capsule; Take 1-2 capsules (100-200 mg total) by mouth 3 (three) times daily as needed for cough.  Dispense: 40 capsule; Refill: 0  2. Asthma, mild intermittent, uncomplicated Stable in general, but mildly exacerbated by current illness. Continue PRN albuterol. - albuterol (PROVENTIL HFA;VENTOLIN HFA) 108 (90 BASE) MCG/ACT inhaler; Inhale 2 puffs into the lungs every 6 (six) hours as needed for wheezing or shortness of breath (cough, shortness of breath or wheezing.).  Dispense: 1 Inhaler; Refill: 1   Fernande Bras, PA-C Physician Assistant-Certified [facility]

## 2015-04-08 NOTE — Progress Notes (Signed)
Subjective:    Patient ID: Jasmine Malone, female    DOB: [birth year], 69 y.o.   MRN: [id number]  Chief Complaint  Patient presents with  . Cough    2 weeks--productive, green  . runny nose   HPI Patient presents today for evaluation of cough, rhinorrhea, HA, and sinus pain x 3 weeks.   Cough and rhinorrhea started together, followed by HA and sinus pain, predominantly in her maxillary sinuses. Cough is productive with thick clear-yellow mucous that is hard to get up. Cough occurs throughout the day and is accompanied by mild SOB with lying down and activities like walking. Has gradually gotten worse. Nasal drainage is clear. Complained of mild chills and sore throat during the first week of her illness, but these have since resolved. Denies fever, N/V, ear pain, tinnitus, nasal congestion, or itchy, watery eyes.     Has tried Mucinex intermittently as well as Fenugreek natural decongestant without much relief. Takes care of 3 grandchildren at home who have all been sick recently.   If she needs an ABX for her symptoms, requests something that will be easy on her stomach. Has used Augmentin in the past and had diarrhea for several weeks.  Has a PMH of asthma and would like a refill on her albuterol inhaler today. Ran out a couple months ago. Was using approximately once a month with good control of her asthma.   Has not been able to get her flu shot this year d/t her recent illness.    Review of Systems  Constitutional: Positive for chills. Negative for fever, activity change, appetite change, fatigue and unexpected weight change.  HENT: Positive for postnasal drip, rhinorrhea, sinus pressure ("both of my cheeks are sore") and sore throat. Negative for congestion, ear pain and tinnitus.   Eyes: Negative for itching and visual disturbance.       Recently had left eye surgery for retinal detachment. Uses drops now for dry eyes.   Respiratory: Positive for cough and shortness of breath.  Negative for chest tightness and wheezing.   Cardiovascular: Negative for chest pain and palpitations.  Gastrointestinal: Negative for nausea, vomiting, abdominal pain and diarrhea.  Musculoskeletal: Negative for myalgias.  Neurological: Positive for headaches. Negative for dizziness, weakness and light-headedness.   Patient Active Problem List   Diagnosis Date Noted  . Post-menopausal bleeding 02/13/2014   Family History  Problem Relation Age of Onset  . Stroke Mother 35    CVA; tobacco abuse.  . Mental illness Mother   . Alcohol abuse Mother   . Diabetes Father   . Cancer Father     brain cancer/malignancy  . Mental illness Father   . Diabetes Brother   . Kidney failure Brother   . Colon cancer Neg Hx   . Depression Sister   . Depression Sister    Social History   Social History  . Marital Status: Married    Spouse Name: N/A  . Number of Children: N/A  . Years of Education: N/A   Occupational History  . Not on file.   Social History Main Topics  . Smoking status: Never Smoker   . Smokeless tobacco: Never Used  . Alcohol Use: Yes     Comment: occasional glass of wine  . Drug Use: No  . Sexual Activity: Yes   Other Topics Concern  . Not on file   Social History Narrative   Marital status: married x 43 years; happily married; no abuse.  Children:  4 children (2 girls 2 boys 42, 38, 35, 30); 6 grandchildren.      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      Lives: with husband; daughter moving out.      Tobacco:  None      Alcohol: wine weekends; mixed drinks at restaurant.     Exercise:  None     ADLs: driving; performs all ADLs; no assistant devices.      Pets: 2 dogs and 1 cat.      Advanced Directives: none; bought a will kit to complete.  FULL CODE but no prolonged measures.         Prior to Admission medications   Medication Sig Start Date End Date Taking? Authorizing Provider  FENUGREEK PO Take 1 capsule by  mouth daily as needed (for congestion).   Yes Historical Provider, MD  Glucos-MSM-C-Mn-Ginger-Willow (MSM GLUCOSAMINE COMPLEX PO) Take 1 capsule by mouth daily.    Yes Historical Provider, MD  Multiple Vitamin (MULTIVITAMIN WITH MINERALS) TABS tablet Take 1 tablet by mouth daily.   Yes Historical Provider, MD  Omega-3 Fatty Acids (FISH OIL PO) Take 2 capsules by mouth daily.    Yes Historical Provider, MD  Red Yeast Rice Extract (RED YEAST RICE PO) Take 1 capsule by mouth daily.   Yes Historical Provider, MD  albuterol (PROVENTIL HFA;VENTOLIN HFA) 108 (90 BASE) MCG/ACT inhaler Inhale 2 puffs into the lungs every 6 (six) hours as needed for wheezing or shortness of breath (cough, shortness of breath or wheezing.). 08/21/14   Ethelda Chick, MD      Objective:   Physical Exam  Constitutional: She is oriented to person, place, and time. She appears well-developed and well-nourished. No distress.  HENT:  Head: Normocephalic and atraumatic.  Right Ear: External ear normal.  Left Ear: External ear normal.  Mouth/Throat: Oropharynx is clear and moist. No oropharyngeal exudate.  Maxillary and frontal sinuses mildly tender to palpation. Erythematous nasal turbinates.   Eyes: EOM are normal. No scleral icterus.  Left eye mildly erythematous consistent with recent eye surgery.   Neck: Neck supple.  Cardiovascular: Normal rate, regular rhythm and normal heart sounds.  Exam reveals no gallop and no friction rub.   No murmur heard. 2+ DP and PT pulses b/l.   Pulmonary/Chest: Effort normal. No respiratory distress. She has wheezes (mild left-sided expiratory wheezes, clear with deep breaths). She has no rales.  Lymphadenopathy:    She has no cervical adenopathy.  Neurological: She is alert and oriented to person, place, and time.  Skin: Skin is warm and dry. No rash noted. She is not diaphoretic. No erythema.  Psychiatric: She has a normal mood and affect. Her behavior is normal. Judgment and thought  content normal.   BP 132/82 mmHg  Pulse 76  Temp(Src) 98 F (36.7 C) (Oral)  Resp 16  Wt 232 lb (105.235 kg)     Assessment & Plan:  1. Subacute sinusitis, unspecified location - Given duration of patient's symptoms and prior intolerance of Augmentin, will treat as subacute sinusitis with Omnicef and Tessalon pearls for cough.  - cefdinir (OMNICEF) 300 MG capsule; Take 2 capsules (600 mg total) by mouth daily.  Dispense: 20 capsule; Refill: 0 - benzonatate (TESSALON) 100 MG capsule; Take 1-2 capsules (100-200 mg total) by mouth 3 (three) times daily as needed for cough.  Dispense: 40 capsule; Refill: 0  2. Asthma, mild intermittent, uncomplicated - Continue on current regimen  of albuterol inhaler prn. Refill provided.  - albuterol (PROVENTIL HFA;VENTOLIN HFA) 108 (90 BASE) MCG/ACT inhaler; Inhale 2 puffs into the lungs every 6 (six) hours as needed for wheezing or shortness of breath (cough, shortness of breath or wheezing.).  Dispense: 1 Inhaler; Refill: 1

## 2015-04-08 NOTE — Patient Instructions (Signed)
Get plenty of rest and drink at least 64 ounces of water daily. 

## 2015-08-16 IMAGING — CR DG ANKLE COMPLETE 3+V*R*
3 series · 3 of 3 positions shown · non-contrast
Comparison: None.

CLINICAL DATA: Twisting injury, medial pain

EXAM:
RIGHT ANKLE - COMPLETE 3+ VIEW

[AP]
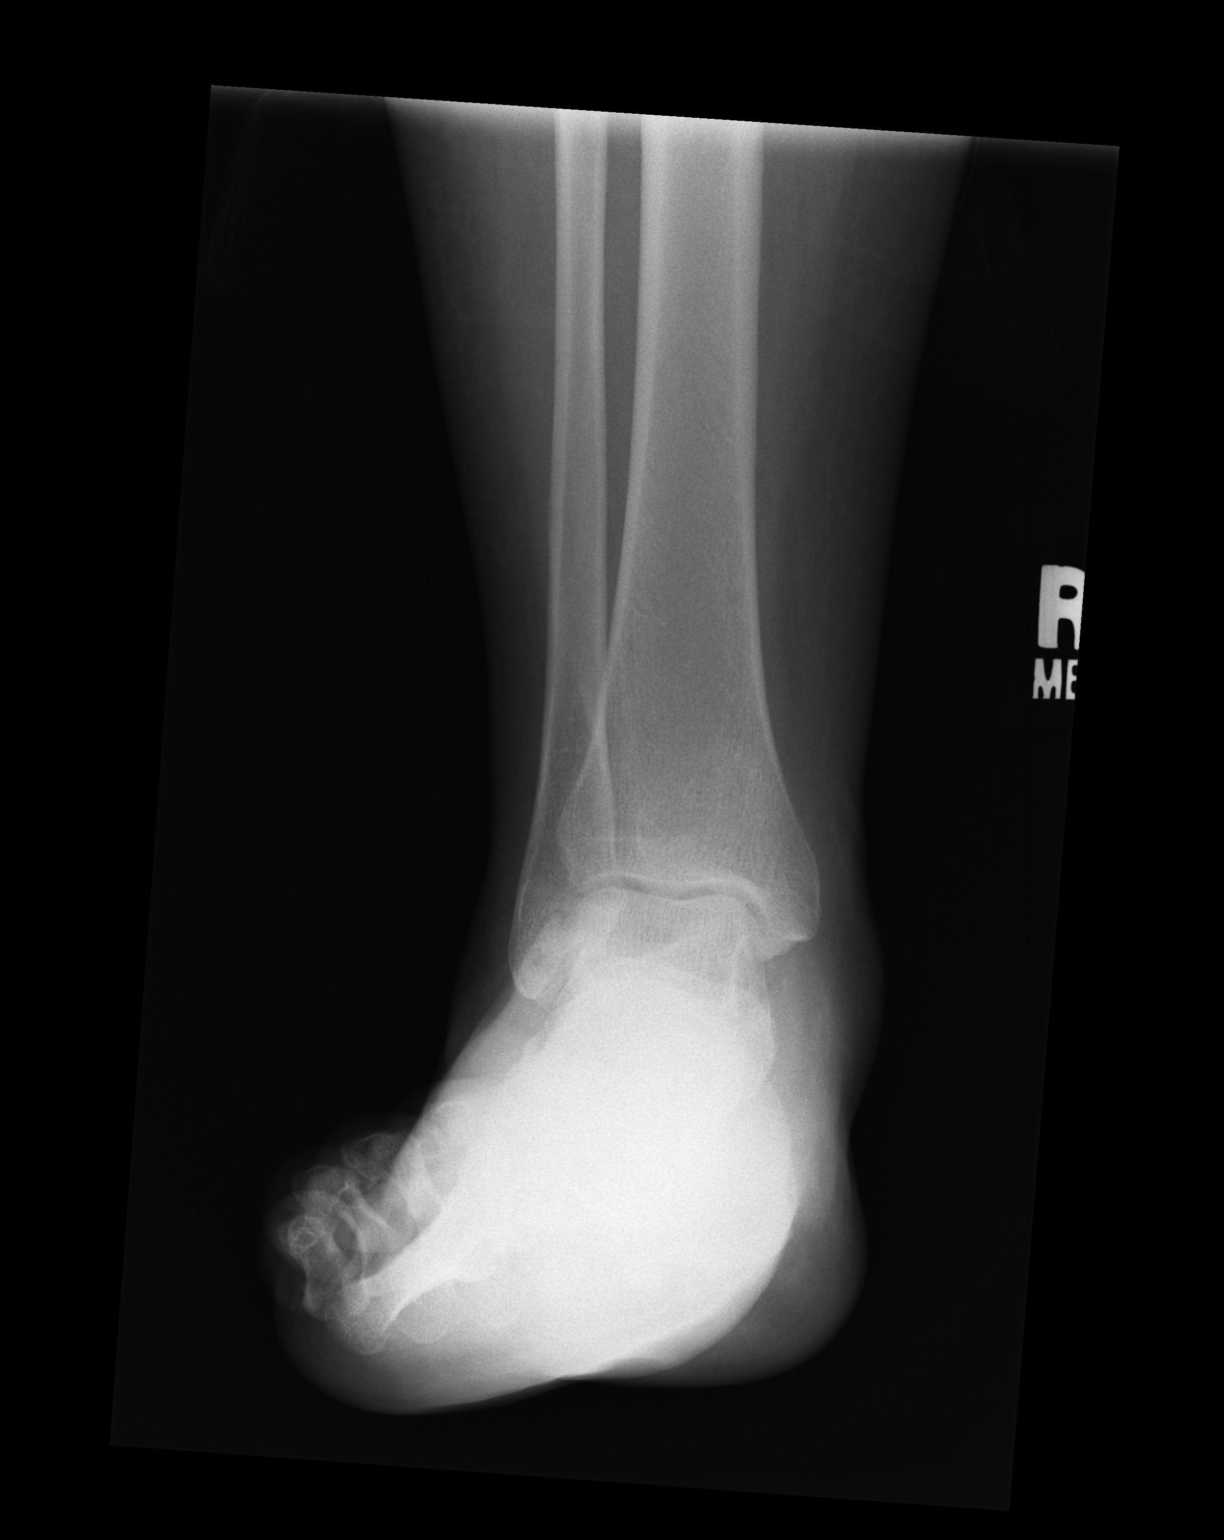

[ap obl int rot]
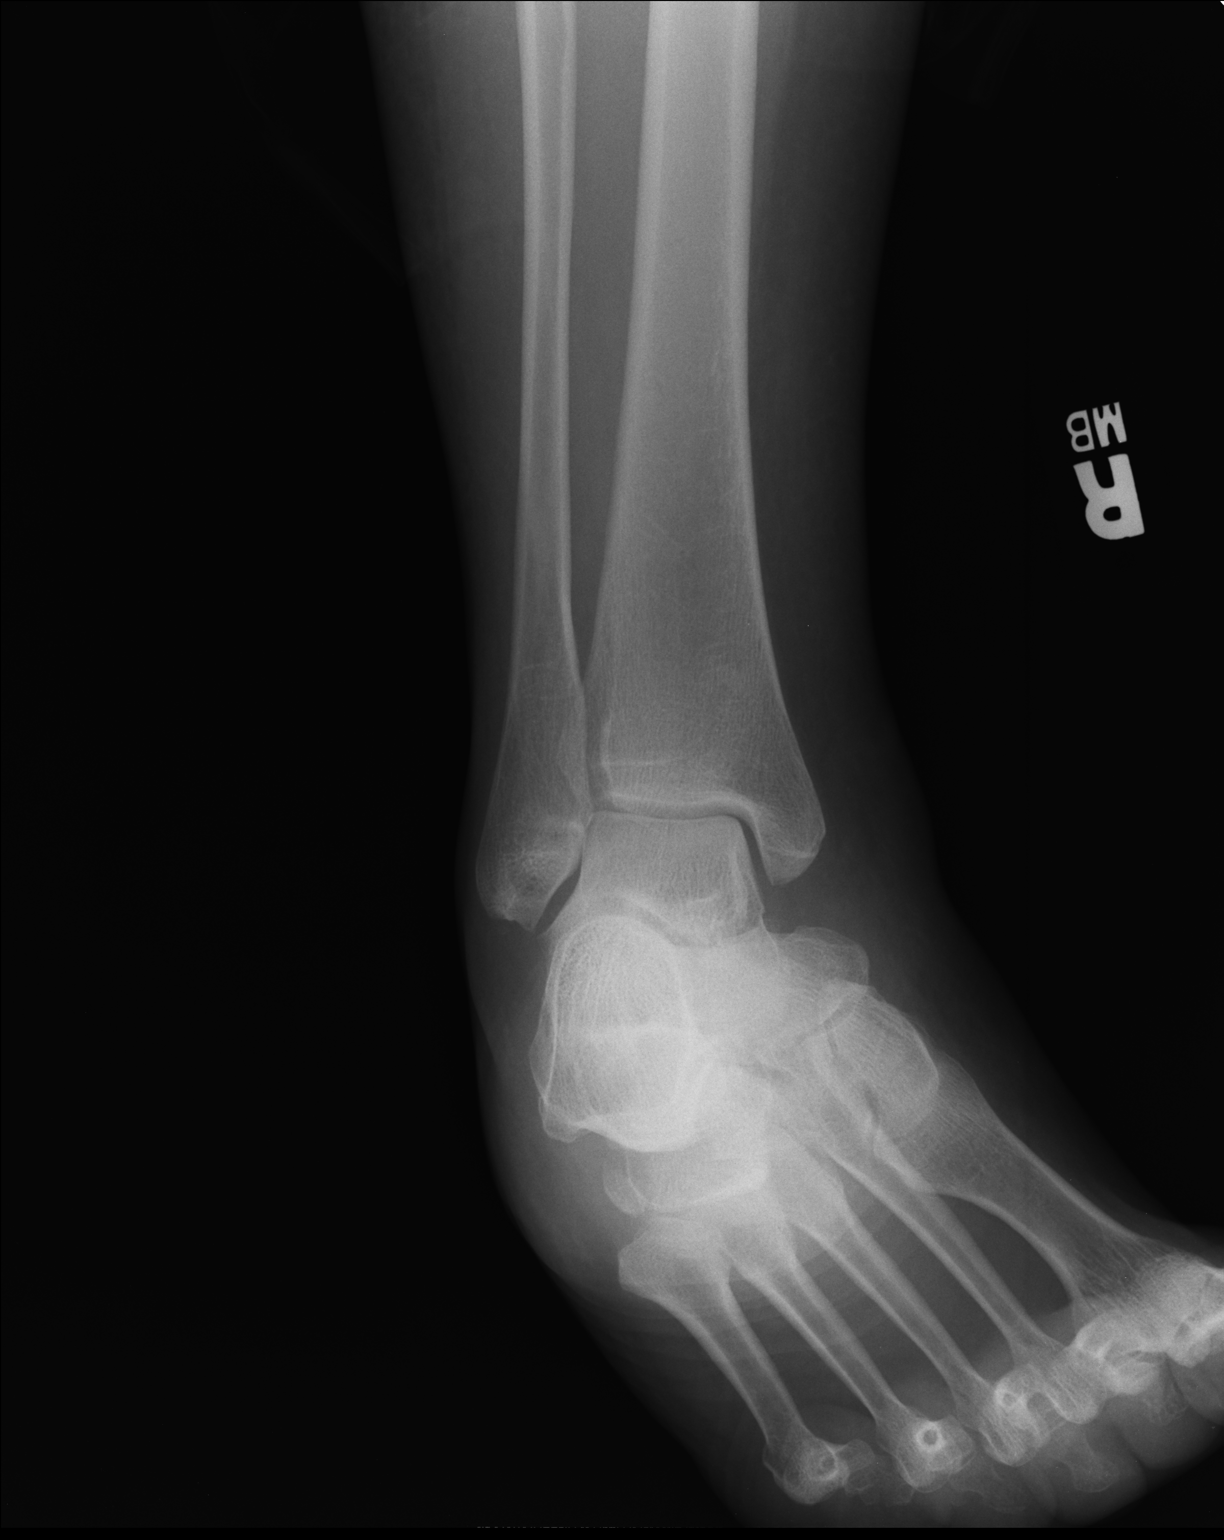

[lateral]
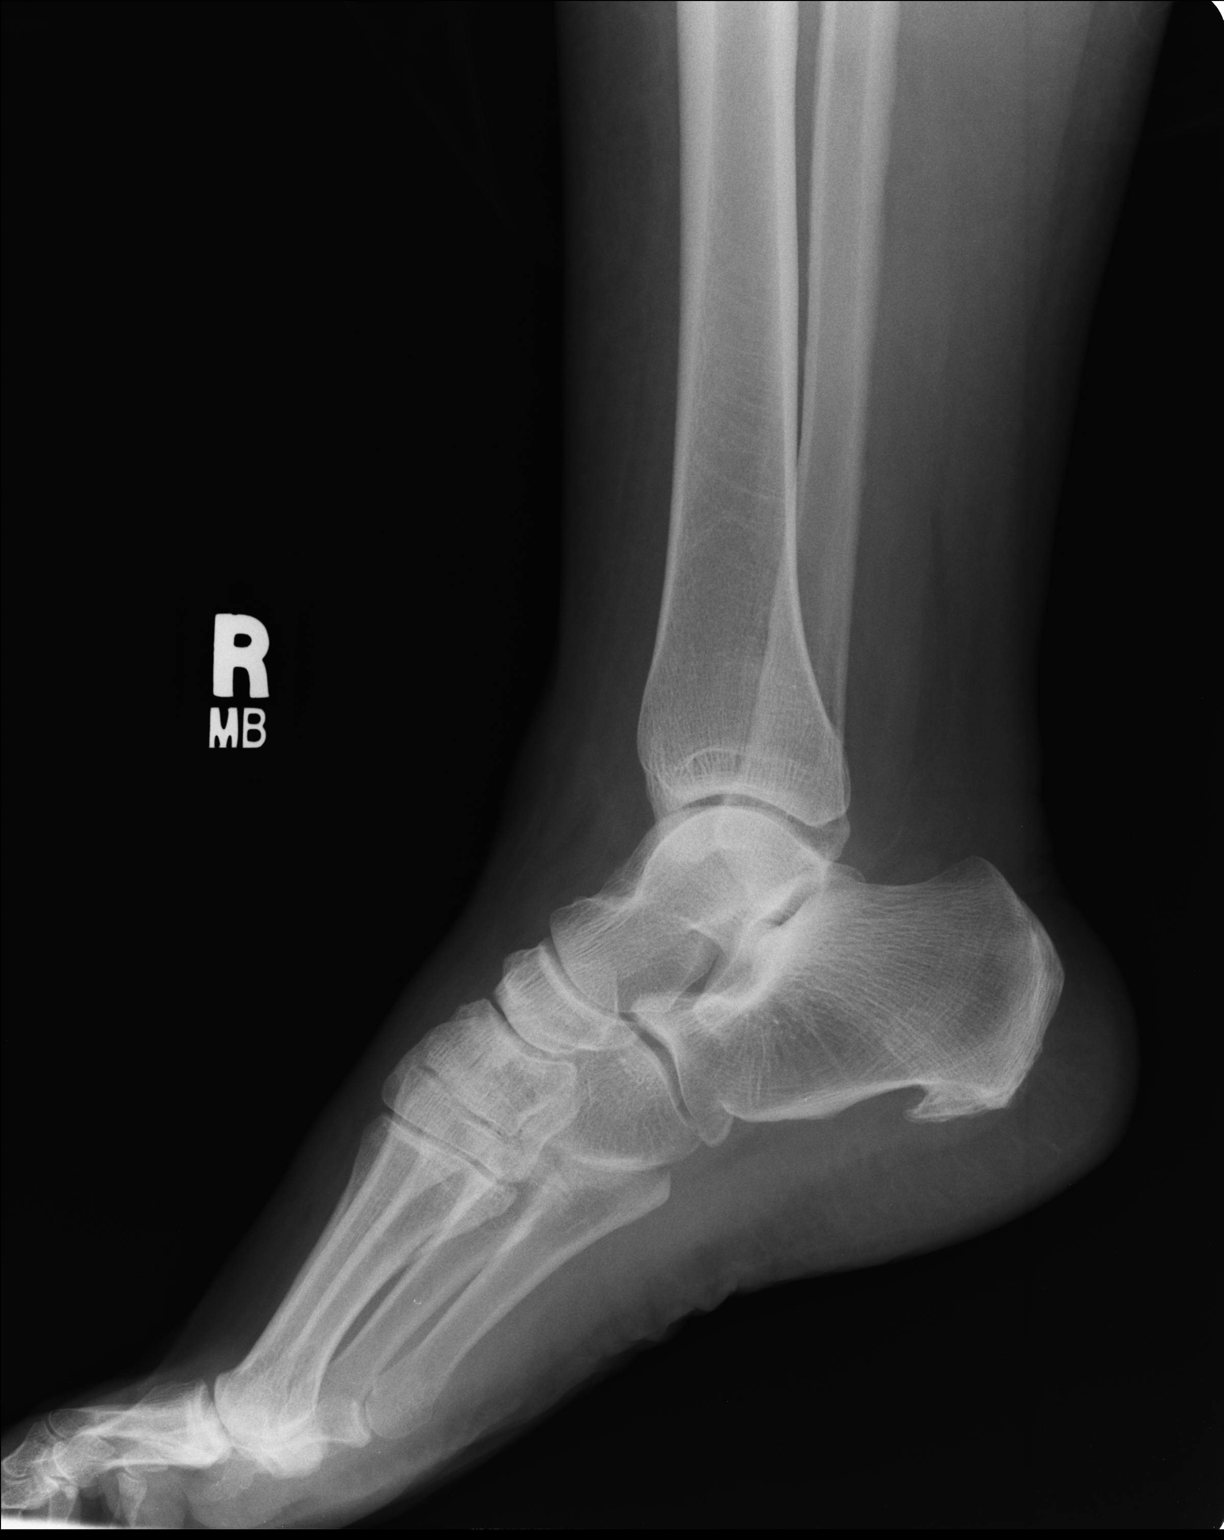

[3 of 3 positions shown; findings below may reference images not displayed]

FINDINGS: There is no evidence of fracture, dislocation, or joint effusion.
There is no evidence of arthropathy or other focal bone abnormality.
Soft tissues are unremarkable. Plantar calcaneal spur noted.
IMPRESSION: No acute osseous finding

## 2015-08-25 DIAGNOSIS — H35352 Cystoid macular degeneration, left eye: Secondary | ICD-10-CM | POA: Diagnosis not present

## 2015-08-25 DIAGNOSIS — H3321 Serous retinal detachment, right eye: Secondary | ICD-10-CM | POA: Diagnosis not present

## 2015-10-02 DIAGNOSIS — F341 Dysthymic disorder: Secondary | ICD-10-CM | POA: Diagnosis not present

## 2015-11-24 DIAGNOSIS — H04123 Dry eye syndrome of bilateral lacrimal glands: Secondary | ICD-10-CM | POA: Diagnosis not present

## 2015-11-24 DIAGNOSIS — H338 Other retinal detachments: Secondary | ICD-10-CM | POA: Diagnosis not present

## 2015-11-24 DIAGNOSIS — H26493 Other secondary cataract, bilateral: Secondary | ICD-10-CM | POA: Diagnosis not present

## 2015-11-26 ENCOUNTER — Ambulatory Visit (INDEPENDENT_AMBULATORY_CARE_PROVIDER_SITE_OTHER): Payer: Managed Care, Other (non HMO) | Admitting: Family Medicine

## 2015-11-26 ENCOUNTER — Encounter: Payer: Self-pay | Admitting: Family Medicine

## 2015-11-26 VITALS — BP 124/80 | HR 67 | Temp 97.9°F | Resp 15 | Ht 65.25 in | Wt 231.8 lb

## 2015-11-26 DIAGNOSIS — E2839 Other primary ovarian failure: Secondary | ICD-10-CM | POA: Diagnosis not present

## 2015-11-26 DIAGNOSIS — Z1159 Encounter for screening for other viral diseases: Secondary | ICD-10-CM

## 2015-11-26 DIAGNOSIS — R159 Full incontinence of feces: Secondary | ICD-10-CM | POA: Diagnosis not present

## 2015-11-26 DIAGNOSIS — Z1231 Encounter for screening mammogram for malignant neoplasm of breast: Secondary | ICD-10-CM

## 2015-11-26 DIAGNOSIS — Z131 Encounter for screening for diabetes mellitus: Secondary | ICD-10-CM | POA: Diagnosis not present

## 2015-11-26 DIAGNOSIS — N3946 Mixed incontinence: Secondary | ICD-10-CM | POA: Diagnosis not present

## 2015-11-26 DIAGNOSIS — Z0000 Encounter for general adult medical examination without abnormal findings: Secondary | ICD-10-CM

## 2015-11-26 LAB — COMPREHENSIVE METABOLIC PANEL
ALT: 16 U/L (ref 6–29)
AST: 18 U/L (ref 10–35)
Albumin: 3.8 g/dL (ref 3.6–5.1)
Alkaline Phosphatase: 74 U/L (ref 33–130)
BILIRUBIN TOTAL: 0.8 mg/dL (ref 0.2–1.2)
BUN: 12 mg/dL (ref 7–25)
CO2: 21 mmol/L (ref 20–31)
CREATININE: 0.76 mg/dL (ref 0.50–0.99)
Calcium: 9 mg/dL (ref 8.6–10.4)
Chloride: 107 mmol/L (ref 98–110)
GLUCOSE: 90 mg/dL (ref 65–99)
Potassium: 4 mmol/L (ref 3.5–5.3)
SODIUM: 143 mmol/L (ref 135–146)
Total Protein: 6.7 g/dL (ref 6.1–8.1)

## 2015-11-26 LAB — POCT URINALYSIS DIP (MANUAL ENTRY)
BILIRUBIN UA: NEGATIVE
BILIRUBIN UA: NEGATIVE
Glucose, UA: NEGATIVE
Nitrite, UA: NEGATIVE
Protein Ur, POC: NEGATIVE
Urobilinogen, UA: 1
pH, UA: 5

## 2015-11-26 LAB — POC MICROSCOPIC URINALYSIS (UMFC)

## 2015-11-26 NOTE — Patient Instructions (Addendum)
   IF you received an x-ray today, you will receive an invoice from [facility]. Please contact [facility] at [phone] with questions or concerns regarding your invoice.   IF you received labwork today, you will receive an invoice from [facility]. Please contact [facility] at [phone] with questions or concerns regarding your invoice.   Our billing staff will not be able to assist you with questions regarding bills from these companies.  You will be contacted with the lab results as soon as they are available. The fastest way to get your results is to activate your My Chart account. Instructions are located on the last page of this paperwork. If you have not heard from us regarding the results in 2 weeks, please contact this office.    We recommend that you schedule a mammogram for breast cancer screening. Typically, you do not need a referral to do this. Please contact a local imaging center to schedule your mammogram.  [facility] - [phone]  *ask for the Radiology Department [facility]) - [phone] or [phone]  [facility] - [phone] [facility] - [phone] [facility] - [phone]  *ask for the Radiology Department [facility] - [phone]  *ask for the Radiology Department [facility] - [phone]  *ask for the Mammography Department [facility] - [phone] You Healthy  Get These Tests  Blood Pressure- Have your blood pressure checked by your healthcare provider at least once a year.  Normal blood pressure is 120/80.  Weight- Have your body mass index (BMI) calculated to screen for obesity.  BMI is a measure of body fat based on height and weight.  You can calculate your own BMI at www.nhlbisupport.com/bmi/  Cholesterol- Have your cholesterol checked every year.  Diabetes- Have  your blood sugar checked every year if you have high blood pressure, high cholesterol, a family history of diabetes or if you are overweight.  Pap Test - Have a pap test every 1 to 5 years if you have been sexually active.  If you are older than 65 and recent pap tests have been normal you may not need additional pap tests.  In addition, if you have had a hysterectomy  for benign disease additional pap tests are not necessary.  Mammogram-Yearly mammograms are essential for early detection of breast cancer  Screening for Colon Cancer- Colonoscopy starting at age 50. Screening may begin sooner depending on your family history and other health conditions.  Follow up colonoscopy as directed by your Gastroenterologist.  Screening for Osteoporosis- Screening begins at age 65 with bone density scanning, sooner if you are at higher risk for developing Osteoporosis.  Get these medicines  Calcium with Vitamin D- Your body requires 1200-1500 mg of Calcium a day and 800-1000 IU of Vitamin D a day.  You can only absorb 500 mg of Calcium at a time therefore Calcium must be taken in 2 or 3 separate doses throughout the day.  Hormones- Hormone therapy has been associated with increased risk for certain cancers and heart disease.  Talk to your healthcare provider about if you need relief from menopausal symptoms.  Aspirin- Ask your healthcare provider about taking Aspirin to prevent Heart Disease and Stroke.  Get these Immuniztions  Flu shot- Every fall  Pneumonia shot- Once after the age of 65; if you are younger ask your healthcare provider if you need a pneumonia shot.    Tetanus- Every ten years.  Zostavax- Once after the age of 60 to prevent shingles.  Take these steps  Don't smoke- Your healthcare provider can help you quit. For tips on how to quit, ask your healthcare provider or go to www.smokefree.gov or call 1-800 QUIT-NOW.  Be physically active- Exercise 5 days a week for a minimum of 30  minutes.  If you are not already physically active, start slow and gradually work up to 30 minutes of moderate physical activity.  Try walking, dancing, bike riding, swimming, etc.  Eat a healthy diet- Eat a variety of healthy foods such as fruits, vegetables, whole grains, low fat milk, low fat cheeses, yogurt, lean meats, chicken, fish, eggs, dried beans, tofu, etc.  For more information go to www.thenutritionsource.org  Dental visit- Brush and floss teeth twice daily; visit your dentist twice a year.  Eye exam- Visit your Optometrist or Ophthalmologist yearly.  Drink alcohol in moderation- Limit alcohol intake to one drink or less a day.  Never drink and drive.  Depression- Your emotional health is as important as your physical health.  If you're feeling down or losing interest in things you normally enjoy, please talk to your healthcare provider.  Seat Belts- can save your life; always wear one  Smoke/Carbon Monoxide detectors- These detectors need to be installed on the appropriate level of your home.  Replace batteries at least once a year.  Violence- If anyone is threatening or hurting you, please tell your healthcare provider. Living Will/ Health care power of attorney- Discuss with your healthcare provider and family. 

## 2015-11-26 NOTE — Progress Notes (Signed)
Subjective:    Patient ID: Jasmine Malone, female    DOB: [birth year], 70 y.o.   MRN: [id number]  11/26/2015  Annual Exam   HPI This 70 y.o. female presents for Annual Wellness Examination.  Last physical: 09-25-2014 Pap smear:  09-25-2014 ASCUS HPV negative Mammogram: several years ago Colonoscopy: 2015 Bone density: none TDAP:  2013 Pneumovax:  2013; Prevnar 13 2016 Zostavax:  None; declined; no chicken pox Influenza:  2016 Eye exam:  Glasses; this week. Followed every 4 months. Dental exam:  Due.  N/v/d: onset recently. Onset two days ago.  Started probiotic one month ago.  Last episode last week.    Stress incontinence: Kiegel's.    Fecal incontinence: intermittent; with exertion.  Smear mostly.  Review of Systems  Constitutional: Negative for fever, chills, diaphoresis, activity change, appetite change, fatigue and unexpected weight change.  HENT: Negative for congestion, dental problem, drooling, ear discharge, ear pain, facial swelling, hearing loss, mouth sores, nosebleeds, postnasal drip, rhinorrhea, sinus pressure, sneezing, sore throat, tinnitus, trouble swallowing and voice change.   Eyes: Negative for photophobia, pain, discharge, redness, itching and visual disturbance.  Respiratory: Negative for apnea, cough, choking, chest tightness, shortness of breath, wheezing and stridor.   Cardiovascular: Negative for chest pain, palpitations and leg swelling.  Gastrointestinal: Positive for diarrhea, nausea and vomiting. Negative for abdominal distention, abdominal pain, anal bleeding, blood in stool, constipation and rectal pain.  Endocrine: Negative for cold intolerance, heat intolerance, polydipsia, polyphagia and polyuria.  Genitourinary: Negative for dysuria, urgency, frequency, hematuria, flank pain, decreased urine volume, vaginal bleeding, vaginal discharge, enuresis, difficulty urinating, genital sores, vaginal pain, menstrual problem, pelvic pain and dyspareunia.    Musculoskeletal: Negative for myalgias, back pain, joint swelling, arthralgias, gait problem, neck pain and neck stiffness.  Skin: Negative for color change, pallor, rash and wound.  Allergic/Immunologic: Negative for environmental allergies, food allergies and immunocompromised state.  Neurological: Negative for dizziness, tremors, seizures, syncope, facial asymmetry, speech difficulty, weakness, light-headedness, numbness and headaches.  Hematological: Negative for adenopathy. Does not bruise/bleed easily.  Psychiatric/Behavioral: Negative for suicidal ideas, hallucinations, behavioral problems, confusion, sleep disturbance, self-injury, dysphoric mood, decreased concentration and agitation. The patient is not nervous/anxious and is not hyperactive.     Past Medical History:  Diagnosis Date  . Anxiety    parental strain; previous medicaiton; ;followed by therapist once per month; Jasmine Malone in [facility].  Marland Kitchen Arthritis   . Asthma    Albuterol use prior to exercise.  No hospitalizations or ED visits.  . Cataracts, bilateral 05/10/2012   s/p B cataract resection.  . Depression   . GERD (gastroesophageal reflux disease)   . Heart murmur   . Hepatitis    Type A as a child  . Hiatal hernia   . Parathyroid abnormality (HCC)   . PONV (postoperative nausea and vomiting)    Past Surgical History:  Procedure Laterality Date  . DILATATION & CURETTAGE/HYSTEROSCOPY WITH TRUECLEAR N/A 02/20/2014   Procedure: DILATATION & CURETTAGE/HYSTEROSCOPY WITH TRUCLEAR;  Surgeon: Meriel Pica, MD;  Location: [facility];  Service: Gynecology;  Laterality: N/A;  . EYE SURGERY Bilateral 2013, 2014   left; 2013, right 2014  . LAPAROSCOPIC CHOLECYSTECTOMY  1998  . PARATHYROIDECTOMY  1997   3 resected.    Marland Kitchen RETINAL DETACHMENT SURGERY Right 2012  . TUBAL LIGATION  1993   Allergies  Allergen Reactions  . Codeine Nausea Only    Can take Percocet with Zofran  . Ibuprofen Other (See Comments)  Irritates stomach  due to hiatal hernia   . Shellfish Allergy Nausea Only   Current Outpatient Prescriptions  Medication Sig Dispense Refill  . albuterol (PROVENTIL HFA;VENTOLIN HFA) 108 (90 BASE) MCG/ACT inhaler Inhale 2 puffs into the lungs every 6 (six) hours as needed for wheezing or shortness of breath (cough, shortness of breath or wheezing.). 1 Inhaler 1  . FENUGREEK PO Take 1 capsule by mouth daily as needed (for congestion).    Stephanie Acre (MSM GLUCOSAMINE COMPLEX PO) Take 1 capsule by mouth daily.     . Multiple Vitamin (MULTIVITAMIN WITH MINERALS) TABS tablet Take 1 tablet by mouth daily.    . Omega-3 Fatty Acids (FISH OIL PO) Take 2 capsules by mouth daily.      No current facility-administered medications for this visit.    Social History   Social History  . Marital status: Married    Spouse name: N/A  . Number of children: N/A  . Years of education: N/A   Occupational History  . Not on file.   Social History Main Topics  . Smoking status: Never Smoker  . Smokeless tobacco: Never Used  . Alcohol use Yes     Comment: occasional glass of wine  . Drug use: No  . Sexual activity: Yes   Other Topics Concern  . Not on file   Social History Narrative   Marital status: married x 44 years; happily married; no abuse.       Children:  4 children (2 girls 2 boys 23, 38, 35, 30); 6 grandchildren.      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      Lives: with husband; daughter moving out.      Tobacco:  None      Alcohol: wine weekends; mixed drinks at restaurant.     Exercise: [employer] two days per week. Weights.       Seatbelt:  100%; no texting     ADLs: driving; performs all ADLs; no assistant devices.      Pets: 2 dogs and 1 cat.      Advanced Directives: none; bought a will kit to complete.  FULL CODE but no prolonged measures.         Family History  Problem Relation Age of Onset  . Stroke Mother 77    CVA; tobacco  abuse.  . Mental illness Mother   . Alcohol abuse Mother   . Diabetes Father   . Cancer Father     brain cancer/malignancy  . Mental illness Father   . Diabetes Brother   . Kidney failure Brother   . Colon cancer Neg Hx   . Depression Sister   . Depression Sister        Objective:    BP 124/80   Pulse 67   Temp 97.9 F (36.6 C) (Oral)   Resp 15   Ht 5' 5.25" (1.657 m)   Wt 231 lb 12.8 oz (105.1 kg)   SpO2 98%   BMI 38.28 kg/m  Physical Exam  Constitutional: She is oriented to person, place, and time. She appears well-developed and well-nourished. No distress.  HENT:  Head: Normocephalic and atraumatic.  Right Ear: External ear normal.  Left Ear: External ear normal.  Nose: Nose normal.  Mouth/Throat: Oropharynx is clear and moist.  Eyes: Conjunctivae and EOM are normal. Pupils are equal, round, and reactive to light.  Neck: Normal range of motion and full passive range of  motion without pain. Neck supple. No JVD present. Carotid bruit is not present. No thyromegaly present.  Cardiovascular: Normal rate, regular rhythm, normal heart sounds and intact distal pulses.  Exam reveals no gallop and no friction rub.   No murmur heard. Pulmonary/Chest: Effort normal and breath sounds normal. She has no wheezes. She has no rales. Right breast exhibits no inverted nipple, no mass, no nipple discharge, no skin change and no tenderness. Left breast exhibits no inverted nipple, no mass, no nipple discharge, no skin change and no tenderness. Breasts are symmetrical.  Abdominal: Soft. Bowel sounds are normal. She exhibits no distension and no mass. There is no tenderness. There is no rebound and no guarding.  Musculoskeletal:       Right shoulder: Normal.       Left shoulder: Normal.       Cervical back: Normal.  Lymphadenopathy:    She has no cervical adenopathy.  Neurological: She is alert and oriented to person, place, and time. She has normal reflexes. No cranial nerve deficit. She  exhibits normal muscle tone. Coordination normal.  Skin: Skin is warm and dry. No rash noted. She is not diaphoretic. No erythema. No pallor.  Psychiatric: She has a normal mood and affect. Her behavior is normal. Judgment and thought content normal.  Nursing note and vitals reviewed.  Results for orders placed or performed in visit on 11/26/15  Comprehensive metabolic panel  Result Value Ref Range   Sodium 143 135 - 146 mmol/L   Potassium 4.0 3.5 - 5.3 mmol/L   Chloride 107 98 - 110 mmol/L   CO2 21 20 - 31 mmol/L   Glucose, Bld 90 65 - 99 mg/dL   BUN 12 7 - 25 mg/dL   Creat 8.11 9.14 - 7.82 mg/dL   Total Bilirubin 0.8 0.2 - 1.2 mg/dL   Alkaline Phosphatase 74 33 - 130 U/L   AST 18 10 - 35 U/L   ALT 16 6 - 29 U/L   Total Protein 6.7 6.1 - 8.1 g/dL   Albumin 3.8 3.6 - 5.1 g/dL   Calcium 9.0 8.6 - 95.6 mg/dL  Hepatitis C antibody  Result Value Ref Range   HCV Ab NEGATIVE NEGATIVE  POCT urinalysis dipstick  Result Value Ref Range   Color, UA yellow yellow   Clarity, UA clear clear   Glucose, UA negative negative   Bilirubin, UA negative negative   Ketones, POC UA negative negative   Spec Grav, UA >=1.030    Blood, UA trace-intact (A) negative   pH, UA 5.0    Protein Ur, POC negative negative   Urobilinogen, UA 1.0    Nitrite, UA Negative Negative   Leukocytes, UA moderate (2+) (A) Negative  POCT Microscopic Urinalysis ([facility])  Result Value Ref Range   WBC,UR,HPF,POC Moderate (A) None WBC/hpf   RBC,UR,HPF,POC Few (A) None RBC/hpf   Bacteria Moderate (A) None, Too numerous to count   Mucus Present (A) Absent   Epithelial Cells, UR Per Microscopy Few (A) None, Too numerous to count cells/hpf   Depression screen [facility] 2/9 11/26/2015 04/08/2015 09/25/2014 08/21/2014  Decreased Interest 0 0 0 -  Down, Depressed, Hopeless 0 0 0 0  PHQ - 2 Score 0 0 0 0   Fall Risk  11/26/2015 04/08/2015 09/25/2014 08/21/2014  Falls in the past year? Yes No Yes Yes  Number falls in past yr: 1 - 2 or  more 2 or more  Injury with Fall? No - Yes Yes  Functional  Status Survey: Is the patient deaf or have difficulty hearing?: No Does the patient have difficulty seeing, even when wearing glasses/contacts?: Yes Does the patient have difficulty concentrating, remembering, or making decisions?: No Does the patient have difficulty walking or climbing stairs?: No Does the patient have difficulty dressing or bathing?: No Does the patient have difficulty doing errands alone such as visiting a doctor's office or shopping?: No     Assessment & Plan:   1. Encounter for Medicare annual wellness exam   2. Screening for diabetes mellitus   3. Need for hepatitis C screening test   4. Mixed incontinence   5. Fecal incontinence   6. Encounter for screening mammogram for breast cancer   7. Estrogen deficiency     Orders Placed This Encounter  Procedures  . MM SCREENING BREAST TOMO BILATERAL    Standing Status:   Future    Standing Expiration Date:   01/26/2017    Order Specific Question:   Reason for Exam (SYMPTOM  OR DIAGNOSIS REQUIRED)    Answer:   screening mammo    Order Specific Question:   Preferred imaging location?    Answer:   [facility]  . DG Bone Density    Standing Status:   Future    Standing Expiration Date:   01/26/2017    Order Specific Question:   Reason for Exam (SYMPTOM  OR DIAGNOSIS REQUIRED)    Answer:   estrogen defi    Order Specific Question:   Preferred [facility] location?    [facility]  . Comprehensive metabolic panel  . Hepatitis C antibody  . Ambulatory referral to Physical Therapy    Referral Priority:   Routine    Referral Type:   Physical Medicine    Referral Reason:   Specialty Services Required    Requested Specialty:   Physical Therapy    Number of Visits Requested:   1  . POCT urinalysis dipstick  . POCT Microscopic Urinalysis ([facility])   No orders of the defined types were placed in this encounter.   Return in about 1 year (around  11/25/2016) for complete physical examiniation.    Nabilah Davoli Paulita Fujita, M.D. [facility] [address] [place], [place]  [address] [phone] phone [phone] fax

## 2015-11-27 LAB — HEPATITIS C ANTIBODY: HCV Ab: NEGATIVE

## 2016-01-16 ENCOUNTER — Encounter: Payer: Self-pay | Admitting: Family Medicine

## 2016-03-29 DIAGNOSIS — H04123 Dry eye syndrome of bilateral lacrimal glands: Secondary | ICD-10-CM | POA: Diagnosis not present

## 2016-03-29 DIAGNOSIS — H35352 Cystoid macular degeneration, left eye: Secondary | ICD-10-CM | POA: Diagnosis not present

## 2016-04-07 ENCOUNTER — Ambulatory Visit (INDEPENDENT_AMBULATORY_CARE_PROVIDER_SITE_OTHER): Payer: Medicare Other | Admitting: Family Medicine

## 2016-04-07 VITALS — BP 122/80 | HR 75 | Temp 98.2°F | Resp 18 | Ht 65.25 in | Wt 235.0 lb

## 2016-04-07 DIAGNOSIS — J029 Acute pharyngitis, unspecified: Secondary | ICD-10-CM | POA: Diagnosis not present

## 2016-04-07 DIAGNOSIS — J0110 Acute frontal sinusitis, unspecified: Secondary | ICD-10-CM | POA: Diagnosis not present

## 2016-04-07 LAB — POCT RAPID STREP A (OFFICE): Rapid Strep A Screen: NEGATIVE

## 2016-04-07 MED ORDER — ALBUTEROL SULFATE HFA 108 (90 BASE) MCG/ACT IN AERS: 2.0000 | INHALATION_SPRAY | Freq: Four times a day (QID) | RESPIRATORY_TRACT | 1 refills | Status: AC | PRN

## 2016-04-07 MED ORDER — CEFDINIR 300 MG PO CAPS: 300.0000 mg | ORAL_CAPSULE | Freq: Two times a day (BID) | ORAL | 0 refills | Status: DC

## 2016-04-07 MED ORDER — IPRATROPIUM BROMIDE 0.03 % NA SOLN: 2.0000 | Freq: Two times a day (BID) | NASAL | 0 refills | Status: DC

## 2016-04-07 NOTE — Patient Instructions (Addendum)
Cefitin 300 mg twice daily times 10 days.  Atrovent 2 sprays and twice daily as needed for congestion.  May take ibuprofen or tylenol for neck tension.  I have refilled albuterol.  IF you received an x-ray today, you will receive an invoice from [facility]. Please contact [facility] at [phone] with questions or concerns regarding your invoice.   IF you received labwork today, you will receive an invoice from [employer]. Please contact [facility] at [phone] with questions or concerns regarding your invoice.   Our billing staff will not be able to assist you with questions regarding bills from these companies.  You will be contacted with the lab results as soon as they are available. The fastest way to get your results is to activate your My Chart account. Instructions are located on the last page of this paperwork. If you have not heard from [place] regarding the results in 2 weeks, please contact this office.    We recommend that you schedule a mammogram for breast cancer screening. Typically, you do not need a referral to do this. Please contact a local imaging center to schedule your mammogram.  [facility] - [phone]  *ask for the Radiology Department [facility]) - [phone] or [phone]  [facility] - [phone] [place] [phone] [facility] - [phone]  *ask for the [facility] - [phone]  *ask for the Radiology Department [facility] - [phone]  *ask for the [place] - [phone]    Sinusitis, Adult Sinusitis is soreness and inflammation of your sinuses. Sinuses are hollow spaces in the bones around your face. They are located:  Around your eyes.  In the middle of your forehead.  Behind your nose.  In your cheekbones. Your  sinuses and nasal passages are lined with a stringy fluid (mucus). Mucus normally drains out of your sinuses. When your nasal tissues get inflamed or swollen, the mucus can get trapped or blocked so air cannot flow through your sinuses. This lets bacteria, viruses, and funguses grow, and that leads to infection. Follow these instructions at home: Medicines  Take, use, or apply over-the-counter and prescription medicines only as told by your doctor. These may include nasal sprays.  If you were prescribed an antibiotic medicine, take it as told by your doctor. Do not stop taking the antibiotic even if you start to feel better. Hydrate and Humidify  Drink enough water to keep your pee (urine) clear or pale yellow.  Use a cool mist humidifier to keep the humidity level in your home above 50%.  Breathe in steam for 10-15 minutes, 3-4 times a day or as told by your doctor. You can do this in the bathroom while a hot shower is running.  Try not to spend time in cool or dry air. Rest  Rest as much as possible.  Sleep with your head raised (elevated).  Make sure to get enough sleep each night. General instructions  Put a warm, moist washcloth on your face 3-4 times a day or as told by your doctor. This will help with discomfort.  Wash your hands often with soap and water. If there is no soap and water, use hand sanitizer.  Do not smoke. Avoid being around people who are smoking (secondhand smoke).  Keep all follow-up visits as told by your doctor. This is important. Contact a doctor if:  You have a fever.  Your symptoms get worse.  Your symptoms do not get better within 10 days. Get help right away if:  You have a very bad headache.  You cannot stop throwing up (vomiting).  You have pain or swelling around your face or eyes.  You have trouble seeing.  You feel confused.  Your neck is stiff.  You have trouble breathing. This information is not intended to replace advice  given to you by your health care provider. Make sure you discuss any questions you have with your health care provider. Document Released: 10/13/2007 Document Revised: 12/21/2015 Document Reviewed: 02/19/2015 Elsevier Interactive Patient Education  2017 [employer].

## 2016-04-07 NOTE — Progress Notes (Signed)
Patient ID: Jasmine Malone, female    DOB: 15-Jul-1945, 70 y.o.   MRN: [id number]  PCP: Nilda Simmer, MD  Chief Complaint  Patient presents with  . Sore Throat    POSS FLU SHOT  . Sinusitis  . Cough    Subjective:   HPI 70 year old female present for evaluation of sinus congestion, cough and sore throat for greater than 1 week. Pt is established at [facility] and is a patient of Dr. Nilda Simmer. 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 Pt reports her illness started as sinus congestion with a non-productive dry cough w/ post nasal drainage. Sore throat worsened today and is more scratchy than painful. Throughout the course of illness she reports  intermittent headache and bilateral ear pain with pressure. Denis fever or chills.  Social History   Social History  . Marital status: Married    Spouse name: N/A  . Number of children: N/A  . Years of education: N/A   Occupational History  . Not on file.   Social History Main Topics  . Smoking status: Never Smoker  . Smokeless tobacco: Never Used  . Alcohol use Yes     Comment: occasional glass of wine  . Drug use: No  . Sexual activity: Yes   Other Topics Concern  . Not on file   Social History Narrative   Marital status: married x 44 years; happily married; no abuse.       Children:  4 children (2 girls 2 boys 58, 38, 35, 30); 6 grandchildren.      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      Lives: with husband; daughter moving out.      Tobacco:  None      Alcohol: wine weekends; mixed drinks at restaurant.     Exercise: [employer] two days per week. Weights.       Seatbelt:  100%; no texting     ADLs: driving; performs all ADLs; no assistant devices.      Pets: 2 dogs and 1 cat.      Advanced Directives: none; bought a will kit to complete.  FULL CODE but no prolonged measures.          Family History  Problem  Relation Age of Onset  . Stroke Mother 95    CVA; tobacco abuse.  . Mental illness Mother   . Alcohol abuse Mother   . Diabetes Father   . Cancer Father     brain cancer/malignancy  . Mental illness Father   . Diabetes Brother   . Kidney failure Brother   . Depression Sister   . Depression Sister   . Colon cancer Neg Hx    Review of Systems See HPI  Patient Active Problem List   Diagnosis Date Noted  . Post-menopausal bleeding 02/13/2014     Prior to Admission medications   Medication Sig Start Date End Date Taking? Authorizing Provider  albuterol (PROVENTIL HFA;VENTOLIN HFA) 108 (90 BASE) MCG/ACT inhaler Inhale 2 puffs into the lungs every 6 (six) hours as needed for wheezing or shortness of breath (cough, shortness of breath or wheezing.). 04/08/15  Yes Chelle Jeffery, PA-C  FENUGREEK PO Take 1 capsule by mouth daily as needed (for congestion).   Yes Historical Provider, MD  Glucos-MSM-C-Mn-Ginger-Willow (MSM GLUCOSAMINE COMPLEX PO) Take 1 capsule by mouth daily.    Yes Historical Provider, MD  Multiple  Vitamin (MULTIVITAMIN WITH MINERALS) TABS tablet Take 1 tablet by mouth daily.   Yes Historical Provider, MD  Omega-3 Fatty Acids (FISH OIL PO) Take 2 capsules by mouth daily.    Yes Historical Provider, MD   Allergies  Allergen Reactions  . Codeine Nausea Only    Can take Percocet with Zofran  . Ibuprofen Other (See Comments)    Irritates stomach due to hiatal hernia   . Shellfish Allergy Nausea Only     Objective:  Physical Exam  Constitutional: She appears well-developed and well-nourished.  HENT:  Right Ear: Hearing, tympanic membrane, external ear and ear canal normal.  Left Ear: Hearing, tympanic membrane, external ear and ear canal normal.  Nose: Mucosal edema and rhinorrhea present.  Mouth/Throat: Uvula is midline. Posterior oropharyngeal erythema present. No oropharyngeal exudate or posterior oropharyngeal edema.  Eyes: Conjunctivae are normal. Pupils are  equal, round, and reactive to light.  Cardiovascular: Normal rate, regular rhythm, normal heart sounds and intact distal pulses.   Pulmonary/Chest: Effort normal and breath sounds normal.  Skin: Skin is warm and dry.  Psychiatric: She has a normal mood and affect. Her behavior is normal. Judgment and thought content normal.   Vitals:   04/07/16 1320  BP: 122/80  Pulse: 75  Resp: 18  Temp: 98.2 F (36.8 C)     Assessment & Plan:  1. Sore throat - POCT rapid strep A-negative  - Culture, Group A Strep-pending  2. Acute frontal sinusitis, recurrence not specified Plan: -Cefdinir (Omnicef) 300 mg, 2 times daily for 10 days. -Ipratropium (Atrovent) 0.03%, 2 sprays, twice daily as needed for nasal congestion -Influenza Vaccine-Deferred until illness improves and coarse of antibiotic therapy is completed.  Return if symptoms worsen or do not improve.  Godfrey Pick. Tiburcio Pea, MSN, FNP-C [facility]

## 2016-04-09 LAB — CULTURE, GROUP A STREP: ORGANISM ID, BACTERIA: NORMAL

## 2016-05-17 DIAGNOSIS — R69 Illness, unspecified: Secondary | ICD-10-CM | POA: Diagnosis not present

## 2016-06-07 ENCOUNTER — Encounter: Payer: Self-pay | Admitting: Internal Medicine

## 2016-06-14 DIAGNOSIS — R69 Illness, unspecified: Secondary | ICD-10-CM | POA: Diagnosis not present

## 2016-07-12 DIAGNOSIS — R69 Illness, unspecified: Secondary | ICD-10-CM | POA: Diagnosis not present

## 2016-07-15 ENCOUNTER — Encounter: Payer: Self-pay | Admitting: Physician Assistant

## 2016-07-15 ENCOUNTER — Ambulatory Visit (INDEPENDENT_AMBULATORY_CARE_PROVIDER_SITE_OTHER): Payer: Medicare HMO | Admitting: Physician Assistant

## 2016-07-15 ENCOUNTER — Encounter (INDEPENDENT_AMBULATORY_CARE_PROVIDER_SITE_OTHER): Payer: Self-pay

## 2016-07-15 VITALS — BP 124/70 | HR 72 | Ht 66.0 in | Wt 237.6 lb

## 2016-07-15 DIAGNOSIS — R1084 Generalized abdominal pain: Secondary | ICD-10-CM | POA: Diagnosis not present

## 2016-07-15 DIAGNOSIS — Z86010 Personal history of colonic polyps: Secondary | ICD-10-CM

## 2016-07-15 DIAGNOSIS — Z1211 Encounter for screening for malignant neoplasm of colon: Secondary | ICD-10-CM | POA: Diagnosis not present

## 2016-07-15 DIAGNOSIS — K219 Gastro-esophageal reflux disease without esophagitis: Secondary | ICD-10-CM

## 2016-07-15 DIAGNOSIS — R112 Nausea with vomiting, unspecified: Secondary | ICD-10-CM | POA: Diagnosis not present

## 2016-07-15 DIAGNOSIS — R197 Diarrhea, unspecified: Secondary | ICD-10-CM | POA: Diagnosis not present

## 2016-07-15 MED ORDER — NA SULFATE-K SULFATE-MG SULF 17.5-3.13-1.6 GM/177ML PO SOLN: 1.0000 | Freq: Once | ORAL | 0 refills | Status: AC

## 2016-07-15 NOTE — Progress Notes (Addendum)
Chief Complaint: Diarrhea, Nausea and Vomiting, Abdominal Pain  HPI:  Jasmine Malone is a 71 year old Caucasian female with a past medical history of anxiety, depression, GERD and others listed below, who was referred to me by Wardell Honour, MD for a complaint of diarrhea, nausea and vomiting as well as abdominal pain .     Patient typically follows with Dr. Hilarie Fredrickson and was last seen 08/02/13 for screening colonoscopy. Patient had a finding of 4 sessile polyps ranging between 3-7 mm in size in the cecum, ascending, transverse and sigmoid colon. Mild diverticulosis was also noted in the ascending, descending and sigmoid colon. It was recommended patient have repeat in 3 years due to finding of sessile serrated polyps.   Today, the patient tells me that she has diarrhea off and on and has done for years since her cholecystectomy. Over the past month this has occurred on 2 separate occasions. The patient can always relate this to certain meals that she has eaten. The last time this occurred with eating a "rare steak" when she was out last Tuesday. Patient tells me that she will have diarrhea and then gradually return to regular solid stools. Patient also tells me that other fried foods and sometimes lactose seemed to give her diarrhea. This is sometimes accompanied by right-sided abdominal pain.   Patient also describes that she has occasional nausea and some vomiting. This occurs very rarely and is typically related to "increases in stress/anxiety" in her life.     The patient tells me that she does have heartburn and reflux as well and has been nervous to be on medication after hearing about all the side effects. Her daughter, who is a Marine scientist, has been encouraging her to get an EGD as symptoms have been occurring for a year.   Patient denies fever, chills, blood in her stool, melena, weight loss, fatigue, anorexia or chronic abdominal pain.  Past Medical History:  Diagnosis Date  . Anxiety    parental  strain; previous medicaiton; ;followed by therapist once per month; Nan Hyball in [facility].  Marland Kitchen Arthritis   . Asthma    Albuterol use prior to exercise.  No hospitalizations or ED visits.  . Cataracts, bilateral 05/10/2012   s/p B cataract resection.  . Depression   . GERD (gastroesophageal reflux disease)   . Heart murmur   . Hepatitis    Type A as a child  . Hiatal hernia   . Parathyroid abnormality ([place])   . PONV (postoperative nausea and vomiting)     Past Surgical History:  Procedure Laterality Date  . CATARACT EXTRACTION, BILATERAL    . DILATATION & CURETTAGE/HYSTEROSCOPY WITH TRUECLEAR N/A 02/20/2014   Procedure: DILATATION & CURETTAGE/HYSTEROSCOPY WITH TRUCLEAR;  Surgeon: Margarette Asal, MD;  Location: [facility];  Service: Gynecology;  Laterality: N/A;  . [place]  . PARATHYROIDECTOMY  1997   3 resected.    Marland Kitchen RETINAL DETACHMENT SURGERY Right 2012  . RETINAL DETACHMENT SURGERY Left    x 4  . TUBAL LIGATION  1993    Current Outpatient Prescriptions  Medication Sig Dispense Refill  . albuterol (PROVENTIL HFA;VENTOLIN HFA) 108 (90 Base) MCG/ACT inhaler Inhale 2 puffs into the lungs every 6 (six) hours as needed for wheezing or shortness of breath (cough, shortness of breath or wheezing.). 1 Inhaler 1  . FENUGREEK PO Take 1 capsule by mouth daily as needed (for congestion).    Donnie Aho (MSM GLUCOSAMINE COMPLEX PO) Take 1 capsule by  mouth daily.     Marland Kitchen ipratropium (ATROVENT) 0.03 % nasal spray Place 2 sprays into both nostrils 2 (two) times daily. 30 mL 0  . Multiple Vitamin (MULTIVITAMIN WITH MINERALS) TABS tablet Take 1 tablet by mouth daily.    . Omega-3 Fatty Acids (FISH OIL PO) Take 2 capsules by mouth daily.     . Na Sulfate-K Sulfate-Mg Sulf 17.5-3.13-1.6 GM/180ML SOLN Take 1 kit by mouth once. 354 mL 0   No current facility-administered medications for this visit.     Allergies as of 07/15/2016 - Review Complete 07/15/2016    Allergen Reaction Noted  . Codeine Nausea Only 04/25/2013  . Ibuprofen Other (See Comments) 04/25/2013  . Shellfish allergy Nausea Only 02/13/2014    Family History  Problem Relation Age of Onset  . Stroke Mother 52    CVA; tobacco abuse.  . Mental illness Mother   . Alcohol abuse Mother   . Diabetes Father   . Cancer Father     brain cancer/malignancy  . Mental illness Father   . Diabetes Brother   . Kidney failure Brother   . Diabetes Sister   . Depression Sister   . Depression Sister   . Diabetes Sister   . Colon cancer Neg Hx     Social History   Social History  . Marital status: Married    Spouse name: N/A  . Number of children: N/A  . Years of education: N/A   Occupational History  . Not on file.   Social History Main Topics  . Smoking status: Never Smoker  . Smokeless tobacco: Never Used  . Alcohol use Yes     Comment: occasional glass of wine  . Drug use: No  . Sexual activity: Yes   Other Topics Concern  . Not on file   Social History Narrative   Marital status: married x 27 years; happily married; no abuse.       Children:  4 children (2 girls 2 boys 25, 31, 27, 64); 6 grandchildren.      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      Lives: with husband; daughter moving out.      Tobacco:  None      Alcohol: wine weekends; mixed drinks at restaurant.     Exercise: [employer] two days per week. Weights.       Seatbelt:  100%; no texting     ADLs: driving; performs all ADLs; no assistant devices.      Pets: 2 dogs and 1 cat.      Advanced Directives: none; bought a will kit to complete.  FULL CODE but no prolonged measures.          Review of Systems:    Constitutional: No weight loss, fever or chills Skin: No rash  Cardiovascular: No chest pain Respiratory: No SOB  Gastrointestinal: See HPI and otherwise negative Genitourinary: No dysuria or change in urinary frequency Neurological: No  headache Musculoskeletal: No new muscle or joint pain Hematologic: No bleeding or bruising Psychiatric: Positive for depression and anxiety   Physical Exam:  Vital signs: BP 124/70   Pulse 72   Ht _0  (1.676 m)   Wt 237 lb 9.6 oz (107.8 kg)   BMI 38.35 kg/m   Constitutional:   Pleasant Obese Caucasian female appears to be in NAD, Well developed, Well nourished, alert and cooperative Head:  Normocephalic and atraumatic. Eyes:   PEERL, EOMI. No icterus.  Conjunctiva pink. Ears:  Normal auditory acuity. Neck:  Supple Throat: Oral cavity and pharynx without inflammation, swelling or lesion.  Respiratory: Respirations even and unlabored. Lungs clear to auscultation bilaterally.   No wheezes, crackles, or rhonchi.  Cardiovascular: Normal S1, S2. No MRG. Regular rate and rhythm. No peripheral edema, cyanosis or pallor.  Gastrointestinal:  Soft, nondistended, nontender. No rebound or guarding. Normal bowel sounds. No appreciable masses or hepatomegaly. Rectal:  Not performed.  Msk:  Symmetrical without gross deformities. Without edema, no deformity or joint abnormality.  Neurologic:  Alert and  oriented x4;  grossly normal neurologically.  Skin:   Dry and intact without significant lesions or rashes. Psychiatric: Demonstrates good judgement and reason without abnormal affect or behaviors.  No recent labs or imaging.  Assessment: 1. Diarrhea: Patient can relate to eating certain types of food such as high fatty meals, lactose, likely related to postcholecystectomy bowel habits versus IBS 2. Nausea and vomiting: Very intermittent episodes, mostly related to stress and anxiety, consider functional dyspepsia versus bile reflux versus other 3. GERD: Daily over the past many years, concern for damage at this point 4. Abdominal pain: With all of the above  Plan: 1. Discussed with the patient that if she can relate diarrhea to certain types of food she is eating including fried foods and  lactose, likely this is related to her cholecystectomy and "bile dumping". Patient describes specific episodes after certain foods. Would recommend that she avoid these in the future. Could also discuss Colestid or cholestyramine in the future. 2. Schedule patient for screening colonoscopy and EGD in the [place] with Dr. Hilarie Fredrickson. We discussed risks, benefits, limitations and alternatives the patient agrees to proceed. 3. Patient has had uncontrolled reflux for the past "many years", she has been scared to take medicine, now with some episodes of nausea and vomiting, recommend EGD as above 4. Did offer the patient medicine such as PPI, but that she would prefer to wait to see what it is before taking anything for it. 5. Patient does discuss wanting to try the low FODMAP diet for which she has just received some literature. Told her that this is worth a try as there may be an element of IBS 6. Patient to follow in clinic per Dr. Vena Rua recommendations after procedures.  Ellouise Newer, PA-C [facility] 07/15/2016, 3:30 PM  Cc: Wardell Honour, MD   Addendum: Reviewed and agree with initial management. Colon is for surveillance given hx of polyps. EGD to eval n/v/d. Jerene Bears, MD

## 2016-07-15 NOTE — Patient Instructions (Signed)
If you are age 69 or older, your body mass index should be between 23-30. Your Body mass index is 38.35 kg/m. If this is out of the aforementioned range listed, please consider follow up with your Primary Care Provider.  If you are age 19 or younger, your body mass index should be between 19-25. Your Body mass index is 38.35 kg/m. If this is out of the aformentioned range listed, please consider follow up with your Primary Care Provider.   We have sent the following medications to your pharmacy for you to pick up at your convenience:  [place] have been scheduled for an endoscopy and colonoscopy. Please follow the written instructions given to you at your visit today. Please pick up your prep supplies at the pharmacy within the next 1-3 days. If you use inhalers (even only as needed), please bring them with you on the day of your procedure. Your physician has requested that you go to www.startemmi.com and enter the access code given to you at your visit today. This web site gives a general overview about your procedure. However, you should still follow specific instructions given to you by our office regarding your preparation for the procedure.  Thank you.

## 2016-08-03 ENCOUNTER — Telehealth: Payer: Self-pay | Admitting: Physician Assistant

## 2016-08-03 NOTE — Telephone Encounter (Signed)
I have advised patient\ that we have placed a suprep at the front desk for her to pick up at her convenience. She verbalizes understanding.

## 2016-08-04 DIAGNOSIS — R69 Illness, unspecified: Secondary | ICD-10-CM | POA: Diagnosis not present

## 2016-08-05 ENCOUNTER — Encounter: Payer: Self-pay | Admitting: Internal Medicine

## 2016-08-09 DIAGNOSIS — R69 Illness, unspecified: Secondary | ICD-10-CM | POA: Diagnosis not present

## 2016-08-11 DIAGNOSIS — R69 Illness, unspecified: Secondary | ICD-10-CM | POA: Diagnosis not present

## 2016-08-18 ENCOUNTER — Encounter: Payer: Self-pay | Admitting: Internal Medicine

## 2016-08-18 ENCOUNTER — Ambulatory Visit (AMBULATORY_SURGERY_CENTER): Payer: Medicare HMO | Admitting: Internal Medicine

## 2016-08-18 VITALS — BP 153/83 | HR 70 | Temp 99.8°F | Resp 13 | Ht 66.0 in | Wt 237.0 lb

## 2016-08-18 DIAGNOSIS — D124 Benign neoplasm of descending colon: Secondary | ICD-10-CM | POA: Diagnosis not present

## 2016-08-18 DIAGNOSIS — D120 Benign neoplasm of cecum: Secondary | ICD-10-CM

## 2016-08-18 DIAGNOSIS — R112 Nausea with vomiting, unspecified: Secondary | ICD-10-CM | POA: Diagnosis not present

## 2016-08-18 DIAGNOSIS — K2950 Unspecified chronic gastritis without bleeding: Secondary | ICD-10-CM | POA: Diagnosis not present

## 2016-08-18 DIAGNOSIS — K219 Gastro-esophageal reflux disease without esophagitis: Secondary | ICD-10-CM

## 2016-08-18 DIAGNOSIS — D122 Benign neoplasm of ascending colon: Secondary | ICD-10-CM

## 2016-08-18 DIAGNOSIS — Z86010 Personal history of colonic polyps: Secondary | ICD-10-CM | POA: Diagnosis not present

## 2016-08-18 DIAGNOSIS — D125 Benign neoplasm of sigmoid colon: Secondary | ICD-10-CM

## 2016-08-18 DIAGNOSIS — K635 Polyp of colon: Secondary | ICD-10-CM

## 2016-08-18 DIAGNOSIS — Z1211 Encounter for screening for malignant neoplasm of colon: Secondary | ICD-10-CM | POA: Diagnosis not present

## 2016-08-18 MED ORDER — PANTOPRAZOLE SODIUM 40 MG PO TBEC: 40.0000 mg | DELAYED_RELEASE_TABLET | Freq: Every day | ORAL | 3 refills | Status: DC

## 2016-08-18 MED ORDER — SODIUM CHLORIDE 0.9 % IV SOLN: 500.0000 mL | INTRAVENOUS | Status: DC

## 2016-08-18 NOTE — Progress Notes (Signed)
A and O x3. Report to RN. Tolerated MAC anesthesia well.Teeth unchanged after procedure.

## 2016-08-18 NOTE — Op Note (Signed)
[facility] Patient Name: Jasmine Malone Procedure Date: 08/18/2016 2:08 PM MRN: [id number] Endoscopist: Beverley Fiedler , MD Age: 71 Referring MD:  Date of Birth: 07-17-1945 Gender: Female Account #: [id number] Procedure:                Upper GI endoscopy Indications:              Gastro-esophageal reflux disease, Globus sensation,                            Nausea Medicines:                Monitored Anesthesia Care Procedure:                Pre-Anesthesia Assessment:                           - Prior to the procedure, a History and Physical                            was performed, and patient medications and                            allergies were reviewed. The patient's tolerance of                            previous anesthesia was also reviewed. The risks                            and benefits of the procedure and the sedation                            options and risks were discussed with the patient.                            All questions were answered, and informed consent                            was obtained. Prior Anticoagulants: The patient has                            taken no previous anticoagulant or antiplatelet                            agents. ASA Grade Assessment: III - A patient with                            severe systemic disease. After reviewing the risks                            and benefits, the patient was deemed in                            satisfactory condition to undergo the procedure.  After obtaining informed consent, the endoscope was                            passed under direct vision. Throughout the                            procedure, the patient's blood pressure, pulse, and                            oxygen saturations were monitored continuously. The                            Model GIF-HQ190 [id number]) scope was introduced                            through the mouth, and advanced to the second  part                            of duodenum. The upper GI endoscopy was                            accomplished without difficulty. The patient                            tolerated the procedure well. Scope In: Scope Out: Findings:                 LA Grade B (one or more mucosal breaks greater than                            5 mm, not extending between the tops of two mucosal                            folds) esophagitis with no bleeding was found at                            the gastroesophageal junction.                           A 3 cm hiatal hernia was present.                           Mild to moderate inflammation characterized by                            erosions and erythema was found at the incisura and                            in the gastric antrum. Biopsies were taken with a                            cold forceps for histology and Helicobacter pylori  testing.                           Localized mild inflammation characterized by                            erythema was found in the duodenal bulb.                           The second portion of the duodenum was normal. Complications:            No immediate complications. Estimated Blood Loss:     Estimated blood loss was minimal. Impression:               - LA Grade B reflux esophagitis.                           - 3 cm hiatal hernia.                           - Gastritis. Biopsied.                           - Bulbar duodenitis.                           - Normal second portion of the duodenum. Recommendation:           - Patient has a contact number available for                            emergencies. The signs and symptoms of potential                            delayed complications were discussed with the                            patient. Return to normal activities tomorrow.                            Written discharge instructions were provided to the                             patient.                           - Resume previous diet.                           - Continue present medications.                           - Await pathology results.                           - Begin pantoprazole 40 mg once daily for reflux  esophagitis + gastroduodenitis. Beverley Fiedler, MD 08/18/2016 2:59:43 PM This report has been signed electronically.

## 2016-08-18 NOTE — Progress Notes (Signed)
Called to room to assist during endoscopic procedure.  Patient ID and intended procedure confirmed with present staff. Received instructions for my participation in the procedure from the performing physician.  

## 2016-08-18 NOTE — Op Note (Signed)
[facility] Patient Name: Jasmine Malone Procedure Date: 08/18/2016 2:03 PM MRN: [id number] Endoscopist: Beverley Fiedler , MD Age: 71 Referring MD:  Date of Birth: [birth year] Gender: Female Account #: [id number] Procedure:                Colonoscopy Indications:              Surveillance: Personal history of sessile serrated                            polyps on last colonoscopy 3 years ago Medicines:                Monitored Anesthesia Care Procedure:                Pre-Anesthesia Assessment:                           - Prior to the procedure, a History and Physical                            was performed, and patient medications and                            allergies were reviewed. The patient's tolerance of                            previous anesthesia was also reviewed. The risks                            and benefits of the procedure and the sedation                            options and risks were discussed with the patient.                            All questions were answered, and informed consent                            was obtained. Prior Anticoagulants: The patient has                            taken no previous anticoagulant or antiplatelet                            agents. ASA Grade Assessment: III - A patient with                            severe systemic disease. After reviewing the risks                            and benefits, the patient was deemed in                            satisfactory condition to undergo the procedure.  After obtaining informed consent, the colonoscope                            was passed under direct vision. Throughout the                            procedure, the patient's blood pressure, pulse, and                            oxygen saturations were monitored continuously. The                            Model PCF-[id number]) scope was introduced                            through the anus and  advanced to the the cecum,                            identified by appendiceal orifice and ileocecal                            valve. The colonoscopy was performed without                            difficulty. The patient tolerated the procedure                            well. The quality of the bowel preparation was                            good. The ileocecal valve, appendiceal orifice, and                            rectum were photographed. Scope In: 2:27:27 PM Scope Out: 2:45:09 PM Scope Withdrawal Time: 0 hours 12 minutes 39 seconds  Total Procedure Duration: 0 hours 17 minutes 42 seconds  Findings:                 The digital rectal exam was normal.                           A 7 mm polyp was found in the cecum. The polyp was                            sessile. The polyp was removed with a cold snare.                            Resection and retrieval were complete.                           A 5 mm polyp was found in the ascending colon. The                            polyp was sessile. The  polyp was removed with a                            cold snare. Resection and retrieval were complete.                           A 5 mm polyp was found in the descending colon. The                            polyp was sessile. The polyp was removed with a                            cold snare. Resection and retrieval were complete.                           Multiple Bhattacharyya and large-mouthed diverticula were                            found in the sigmoid colon, descending colon and                            ascending colon.                           The retroflexed view of the distal rectum and anal                            verge was normal and showed no anal or rectal                            abnormalities. Complications:            No immediate complications. Estimated Blood Loss:     Estimated blood loss was minimal. Impression:               - One 7 mm polyp in the cecum, removed  with a cold                            snare. Resected and retrieved.                           - One 5 mm polyp in the ascending colon, removed                            with a cold snare. Resected and retrieved.                           - One 5 mm polyp in the descending colon, removed                            with a cold snare. Resected and retrieved.                           - Moderate diverticulosis in the sigmoid colon,  in                            the descending colon and in the ascending colon.                           - The distal rectum and anal verge are normal on                            retroflexion view. Recommendation:           - Patient has a contact number available for                            emergencies. The signs and symptoms of potential                            delayed complications were discussed with the                            patient. Return to normal activities tomorrow.                            Written discharge instructions were provided to the                            patient.                           - Resume previous diet.                           - Continue present medications.                           - Await pathology results.                           - Repeat colonoscopy is recommended. The                            colonoscopy date will be determined after pathology                            results from today's exam become available for                            review. Beverley Fiedler, MD 08/18/2016 3:06:19 PM This report has been signed electronically.

## 2016-08-18 NOTE — Patient Instructions (Signed)
Handout given on Polyps   YOU HAD AN ENDOSCOPIC PROCEDURE TODAY: Refer to the procedure report and other information in the discharge instructions given to you for any specific questions about what was found during the examination. If this information does not answer your questions, please call [place] office at [phone] to clarify.   YOU SHOULD EXPECT: Some feelings of bloating in the abdomen. Passage of more gas than usual. Walking can help get rid of the air that was put into your GI tract during the procedure and reduce the bloating. If you had a lower endoscopy (such as a colonoscopy or flexible sigmoidoscopy) you may notice spotting of blood in your stool or on the toilet paper. Some abdominal soreness may be present for a day or two, also.  DIET: Your first meal following the procedure should be a light meal and then it is ok to progress to your normal diet. A half-sandwich or bowl of soup is an example of a good first meal. Heavy or fried foods are harder to digest and may make you feel nauseous or bloated. Drink plenty of fluids but you should avoid alcoholic beverages for 24 hours. If you had a esophageal dilation, please see attached instructions for diet.    ACTIVITY: Your care partner should take you home directly after the procedure. You should plan to take it easy, moving slowly for the rest of the day. You can resume normal activity the day after the procedure however YOU SHOULD NOT DRIVE, use power tools, machinery or perform tasks that involve climbing or major physical exertion for 24 hours (because of the sedation medicines used during the test).   SYMPTOMS TO REPORT IMMEDIATELY: A gastroenterologist can be reached at any hour. Please call [phone]  for any of the following symptoms:  Following lower endoscopy (colonoscopy, flexible sigmoidoscopy) Excessive amounts of blood in the stool  Significant tenderness, worsening of abdominal pains  Swelling of the abdomen that is  new, acute  Fever of 100 or higher  Following upper endoscopy (EGD, EUS, ERCP, esophageal dilation) Vomiting of blood or coffee ground material  New, significant abdominal pain  New, significant chest pain or pain under the shoulder blades  Painful or persistently difficult swallowing  New shortness of breath  Black, tarry-looking or red, bloody stools  FOLLOW UP:  If any biopsies were taken you will be contacted by phone or by letter within the next 1-3 weeks. Call [phone]  if you have not heard about the biopsies in 3 weeks.  Please also call with any specific questions about appointments or follow up tests.  

## 2016-08-19 ENCOUNTER — Telehealth: Payer: Self-pay

## 2016-08-19 NOTE — Telephone Encounter (Signed)
  Follow up Call-  Call back number 08/18/2016  Post procedure Call Back phone  # [phone]  Permission to leave phone message Yes  Some recent data might be hidden     Patient questions:  Do you have a fever, pain , or abdominal swelling? No. Pain Score  0 *  Have you tolerated food without any problems? Yes.  Have you been able to return to your normal activities? Yes.    Do you have any questions about your discharge instructions: Diet   No. Medications  No. Follow up visit  No.  Do you have questions or concerns about your Care? No.  Actions: * If pain score is 4 or above: No action needed, pain <4.

## 2016-08-24 ENCOUNTER — Encounter: Payer: Self-pay | Admitting: Internal Medicine

## 2016-09-06 DIAGNOSIS — R69 Illness, unspecified: Secondary | ICD-10-CM | POA: Diagnosis not present

## 2016-09-22 DIAGNOSIS — H04123 Dry eye syndrome of bilateral lacrimal glands: Secondary | ICD-10-CM | POA: Diagnosis not present

## 2016-09-22 DIAGNOSIS — Z961 Presence of intraocular lens: Secondary | ICD-10-CM | POA: Diagnosis not present

## 2016-09-22 DIAGNOSIS — H547 Unspecified visual loss: Secondary | ICD-10-CM | POA: Diagnosis not present

## 2016-09-22 DIAGNOSIS — H3589 Other specified retinal disorders: Secondary | ICD-10-CM | POA: Diagnosis not present

## 2016-10-04 DIAGNOSIS — R69 Illness, unspecified: Secondary | ICD-10-CM | POA: Diagnosis not present

## 2016-10-06 DIAGNOSIS — R69 Illness, unspecified: Secondary | ICD-10-CM | POA: Diagnosis not present

## 2016-11-01 DIAGNOSIS — R69 Illness, unspecified: Secondary | ICD-10-CM | POA: Diagnosis not present

## 2016-11-25 ENCOUNTER — Ambulatory Visit: Payer: Medicare Other | Admitting: Physician Assistant

## 2016-11-26 ENCOUNTER — Ambulatory Visit (INDEPENDENT_AMBULATORY_CARE_PROVIDER_SITE_OTHER): Payer: Medicare HMO | Admitting: Family Medicine

## 2016-11-26 ENCOUNTER — Encounter: Payer: Self-pay | Admitting: Family Medicine

## 2016-11-26 VITALS — BP 124/79 | HR 69 | Temp 98.4°F | Resp 18 | Ht 65.35 in | Wt 229.0 lb

## 2016-11-26 DIAGNOSIS — Z6837 Body mass index (BMI) 37.0-37.9, adult: Secondary | ICD-10-CM

## 2016-11-26 DIAGNOSIS — Z131 Encounter for screening for diabetes mellitus: Secondary | ICD-10-CM | POA: Diagnosis not present

## 2016-11-26 DIAGNOSIS — N3941 Urge incontinence: Secondary | ICD-10-CM | POA: Diagnosis not present

## 2016-11-26 DIAGNOSIS — E6609 Other obesity due to excess calories: Secondary | ICD-10-CM | POA: Diagnosis not present

## 2016-11-26 DIAGNOSIS — K219 Gastro-esophageal reflux disease without esophagitis: Secondary | ICD-10-CM | POA: Diagnosis not present

## 2016-11-26 DIAGNOSIS — Z0000 Encounter for general adult medical examination without abnormal findings: Secondary | ICD-10-CM

## 2016-11-26 MED ORDER — OXYBUTYNIN CHLORIDE ER 5 MG PO TB24: 5.0000 mg | ORAL_TABLET | Freq: Every day | ORAL | 1 refills | Status: DC

## 2016-11-26 NOTE — Patient Instructions (Addendum)
   IF you received an x-ray today, you will receive an invoice from [facility]. Please contact [facility] at [phone] with questions or concerns regarding your invoice.   IF you received labwork today, you will receive an invoice from [facility]. Please contact [facility] at [phone] with questions or concerns regarding your invoice.   Our billing staff will not be able to assist you with questions regarding bills from these companies.  You will be contacted with the lab results as soon as they are available. The fastest way to get your results is to activate your My Chart account. Instructions are located on the last page of this paperwork. If you have not heard from us regarding the results in 2 weeks, please contact this office.      Preventive Care 65 Years and Older, Female Preventive care refers to lifestyle choices and visits with your health care provider that can promote health and wellness. What does preventive care include?  A yearly physical exam. This is also called an annual well check.  Dental exams once or twice a year.  Routine eye exams. Ask your health care provider how often you should have your eyes checked.  Personal lifestyle choices, including: ? Daily care of your teeth and gums. ? Regular physical activity. ? Eating a healthy diet. ? Avoiding tobacco and drug use. ? Limiting alcohol use. ? Practicing safe sex. ? Taking low-dose aspirin every day. ? Taking vitamin and mineral supplements as recommended by your health care provider. What happens during an annual well check? The services and screenings done by your health care provider during your annual well check will depend on your age, overall health, lifestyle risk factors, and family history of disease. Counseling Your health care provider may ask you questions about your:  Alcohol use.  Tobacco use.  Drug use.  Emotional well-being.  Home and relationship  well-being.  Sexual activity.  Eating habits.  History of falls.  Memory and ability to understand (cognition).  Work and work environment.  Reproductive health.  Screening You may have the following tests or measurements:  Height, weight, and BMI.  Blood pressure.  Lipid and cholesterol levels. These may be checked every 5 years, or more frequently if you are over 50 years old.  Skin check.  Lung cancer screening. You may have this screening every year starting at age 55 if you have a 30-pack-year history of smoking and currently smoke or have quit within the past 15 years.  Fecal occult blood test (FOBT) of the stool. You may have this test every year starting at age 50.  Flexible sigmoidoscopy or colonoscopy. You may have a sigmoidoscopy every 5 years or a colonoscopy every 10 years starting at age 50.  Hepatitis C blood test.  Hepatitis B blood test.  Sexually transmitted disease (STD) testing.  Diabetes screening. This is done by checking your blood sugar (glucose) after you have not eaten for a while (fasting). You may have this done every 1-3 years.  Bone density scan. This is done to screen for osteoporosis. You may have this done starting at age 65.  Mammogram. This may be done every 1-2 years. Talk to your health care provider about how often you should have regular mammograms.  Talk with your health care provider about your test results, treatment options, and if necessary, the need for more tests. Vaccines Your health care provider may recommend certain vaccines, such as:  Influenza vaccine. This is recommended every year.    Tetanus, diphtheria, and acellular pertussis (Tdap, Td) vaccine. You may need a Td booster every 10 years.  Varicella vaccine. You may need this if you have not been vaccinated.  Zoster vaccine. You may need this after age 60.  Measles, mumps, and rubella (MMR) vaccine. You may need at least one dose of MMR if you were born in  1955 or later. You may also need a second dose.  Pneumococcal 13-valent conjugate (PCV13) vaccine. One dose is recommended after age 65.  Pneumococcal polysaccharide (PPSV23) vaccine. One dose is recommended after age 65.  Meningococcal vaccine. You may need this if you have certain conditions.  Hepatitis A vaccine. You may need this if you have certain conditions or if you travel or work in places where you may be exposed to hepatitis A.  Hepatitis B vaccine. You may need this if you have certain conditions or if you travel or work in places where you may be exposed to hepatitis B.  Haemophilus influenzae type b (Hib) vaccine. You may need this if you have certain conditions.  Talk to your health care provider about which screenings and vaccines you need and how often you need them. This information is not intended to replace advice given to you by your health care provider. Make sure you discuss any questions you have with your health care provider. Document Released: 05/23/2015 Document Revised: 01/14/2016 Document Reviewed: 02/25/2015 Elsevier Interactive Patient Education  2017 Elsevier Inc.  

## 2016-11-26 NOTE — Progress Notes (Signed)
Subjective:    Patient ID: Jasmine Malone, female    DOB: [birth year], 71 y.o.   MRN: [id number]  HPI This 71 y.o. female presents for Annual Wellness Examination and chronic medical conditions.  Last physical:  11-26-2015 Pap smear:  09-25-2014 ASCUS HPV negative; repeat in 3 years. Mammogram:  09/07/2009 Colonoscopy:  08/18/2016 Bone density:  n/d Eye exam:   Dental exam:  [facility]; Dr.  Maple Hudson.   Visual Acuity Screening   Right eye Left eye Both eyes  Without correction:     With correction: 20/30 20/200 20/25     BP Readings from Last 3 Encounters:  12/01/16 136/81  11/26/16 124/79  08/18/16 (!) 153/83   Wt Readings from Last 3 Encounters:  12/01/16 225 lb (102.1 kg)  11/26/16 229 lb (103.9 kg)  08/18/16 237 lb (107.5 kg)   Immunization History  Administered Date(s) Administered  . Influenza,inj,Quad PF,36+ Mos 04/25/2013  . Pneumococcal Conjugate-13 09/25/2014  . Pneumococcal Polysaccharide-23 05/11/2011  . Pneumococcal-Unspecified 05/11/2011  . Tdap 05/11/2011   Urinary incontinence: s/p urogynecology years ago recommended surgery.  3 pads per day.  Urinary frequency.   Review of Systems  Constitutional: Negative.  Negative for activity change, appetite change, chills, diaphoresis, fatigue, fever and unexpected weight change.  HENT: Positive for postnasal drip. Negative for congestion, dental problem, drooling, ear discharge, ear pain, facial swelling, hearing loss, mouth sores, nosebleeds, rhinorrhea, sinus pressure, sneezing, sore throat, tinnitus, trouble swallowing and voice change.   Eyes: Positive for pain, redness and visual disturbance. Negative for photophobia, discharge and itching.  Respiratory: Positive for shortness of breath. Negative for apnea, cough, choking, chest tightness, wheezing and stridor.   Cardiovascular: Positive for palpitations. Negative for chest pain and leg swelling.  Gastrointestinal: Positive for diarrhea and  nausea. Negative for abdominal distention, abdominal pain, anal bleeding, blood in stool, constipation, rectal pain and vomiting.  Endocrine: Negative for cold intolerance, heat intolerance, polydipsia, polyphagia and polyuria.  Genitourinary: Positive for frequency. Negative for decreased urine volume, difficulty urinating, dyspareunia, dysuria, enuresis, flank pain, genital sores, hematuria, menstrual problem, pelvic pain, urgency, vaginal bleeding, vaginal discharge and vaginal pain.       Nocturia x 1.  Urinary leakage YES: changes three times per day thick.  Musculoskeletal: Positive for arthralgias and back pain. Negative for gait problem, joint swelling, myalgias, neck pain and neck stiffness.  Skin: Negative for color change, pallor, rash and wound.  Allergic/Immunologic: Positive for environmental allergies and food allergies. Negative for immunocompromised state.  Neurological: Positive for light-headedness. Negative for dizziness, tremors, seizures, syncope, facial asymmetry, speech difficulty, weakness, numbness and headaches.  Hematological: Negative for adenopathy. Does not bruise/bleed easily.  Psychiatric/Behavioral: Negative for agitation, behavioral problems, confusion, decreased concentration, dysphoric mood, hallucinations, self-injury, sleep disturbance and suicidal ideas. The patient is not nervous/anxious and is not hyperactive.        Bedtime 0900; wakes up 700am       Objective:   Physical Exam  Constitutional: She is oriented to person, place, and time. She appears well-developed and well-nourished. No distress.  HENT:  Head: Normocephalic and atraumatic.  Right Ear: External ear normal.  Left Ear: External ear normal.  Nose: Nose normal.  Mouth/Throat: Oropharynx is clear and moist.  Eyes: Pupils are equal, round, and reactive to light. Conjunctivae and EOM are normal.  Neck: Normal range of motion and full passive range of motion without pain. Neck supple. No JVD  present. Carotid bruit is not present. No thyromegaly present.  Cardiovascular: Normal rate, regular rhythm and normal heart sounds.  Exam reveals no gallop and no friction rub.   No murmur heard. Pulmonary/Chest: Effort normal and breath sounds normal. She has no wheezes. She has no rales. Right breast exhibits no inverted nipple, no mass, no nipple discharge, no skin change and no tenderness. Left breast exhibits no inverted nipple, no mass, no nipple discharge, no skin change and no tenderness. Breasts are symmetrical.  Abdominal: Soft. Bowel sounds are normal. She exhibits no distension and no mass. There is no tenderness. There is no rebound and no guarding.  Musculoskeletal:       Right shoulder: Normal.       Left shoulder: Normal.       Cervical back: Normal.  Lymphadenopathy:    She has no cervical adenopathy.  Neurological: She is alert and oriented to person, place, and time. She has normal reflexes. No cranial nerve deficit. She exhibits normal muscle tone. Coordination normal.  Skin: Skin is warm and dry. No rash noted. She is not diaphoretic. No erythema. No pallor.  Psychiatric: She has a normal mood and affect. Her behavior is normal. Judgment and thought content normal.  Nursing note and vitals reviewed.   Depression screen [facility] 2/9 12/01/2016 11/26/2016 04/07/2016 11/26/2015 04/08/2015  Decreased Interest 0 0 0 0 0  Down, Depressed, Hopeless 0 0 0 0 0  PHQ - 2 Score 0 0 0 0 0   Fall Risk  12/01/2016 11/26/2016 04/07/2016 11/26/2015 04/08/2015  Falls in the past year? No No No Yes No  Number falls in past yr: - - - 1 -  Injury with Fall? - - - No -   Functional Status Survey: Is the patient deaf or have difficulty hearing?: No Does the patient have difficulty seeing, even when wearing glasses/contacts?: No Does the patient have difficulty concentrating, remembering, or making decisions?: No Does the patient have difficulty walking or climbing stairs?: No Does the patient  have difficulty dressing or bathing?: No      Assessment & Plan:   1. Encounter for Medicare annual wellness exam   2. Urge incontinence of urine   3. Screening for diabetes mellitus   4. Gastroesophageal reflux disease without esophagitis   5. Class 2 obesity due to excess calories without serious comorbidity with body mass index (BMI) of 37.0 to 37.9 in adult     -anticipatory guidance provided --- exercise, weight loss, safe driving practices, aspirin 81mg  daily. -obtain age appropriate screening labs and labs for chronic disease management. -moderate fall risk; no evidence of depression; no evidence of hearing loss.  Discussed advanced directives and living will; also discussed end of life issues including code status.  -urge incontinence; rx for Ditropan XL provided; obtain lbs. -recommend weight loss, exercise for 30-60 minutes five days per week; recommend 1200 kcal restriction per day with a minimum of 60 grams of protein per day.   Orders Placed This Encounter  Procedures  . CBC with Differential/Platelet  . Comprehensive metabolic panel  . POCT urinalysis dipstick   Meds ordered this encounter  Medications  . oxybutynin (DITROPAN-XL) 5 MG 24 hr tablet    Sig: Take 1 tablet (5 mg total) by mouth at bedtime.    Dispense:  90 tablet    Refill:  1   Cecilie Lowers, M.D. Primary Care at [facility] [address] [place], [place]  [address] [phone] phone [phone] fax

## 2016-12-01 ENCOUNTER — Encounter: Payer: Self-pay | Admitting: Physician Assistant

## 2016-12-01 ENCOUNTER — Ambulatory Visit (INDEPENDENT_AMBULATORY_CARE_PROVIDER_SITE_OTHER): Payer: Medicare HMO | Admitting: Physician Assistant

## 2016-12-01 VITALS — BP 136/81 | HR 73 | Temp 97.9°F | Resp 18 | Ht 65.35 in | Wt 225.0 lb

## 2016-12-01 DIAGNOSIS — R109 Unspecified abdominal pain: Secondary | ICD-10-CM

## 2016-12-01 DIAGNOSIS — R197 Diarrhea, unspecified: Secondary | ICD-10-CM | POA: Diagnosis not present

## 2016-12-01 DIAGNOSIS — R300 Dysuria: Secondary | ICD-10-CM | POA: Diagnosis not present

## 2016-12-01 LAB — POCT CBC
Granulocyte percent: 50 %G (ref 37–80)
HEMATOCRIT: 45.1 % (ref 37.7–47.9)
HEMOGLOBIN: 15.2 g/dL (ref 12.2–16.2)
Lymph, poc: 3.5 — AB (ref 0.6–3.4)
MCH, POC: 31.9 pg — AB (ref 27–31.2)
MCHC: 33.7 g/dL (ref 31.8–35.4)
MCV: 94.7 fL (ref 80–97)
MID (cbc): 0.3 (ref 0–0.9)
MPV: 8.4 fL (ref 0–99.8)
POC Granulocyte: 3.8 (ref 2–6.9)
POC LYMPH PERCENT: 46.6 %L (ref 10–50)
POC MID %: 3.4 % (ref 0–12)
Platelet Count, POC: 208 10*3/uL (ref 142–424)
RBC: 4.76 M/uL (ref 4.04–5.48)
RDW, POC: 14.1 %
WBC: 7.5 10*3/uL (ref 4.6–10.2)

## 2016-12-01 LAB — POCT URINALYSIS DIP (MANUAL ENTRY)
BILIRUBIN UA: NEGATIVE
BILIRUBIN UA: NEGATIVE mg/dL
Glucose, UA: NEGATIVE mg/dL
Leukocytes, UA: NEGATIVE
NITRITE UA: NEGATIVE
PH UA: 6 (ref 5.0–8.0)
Protein Ur, POC: NEGATIVE mg/dL
Spec Grav, UA: 1.015 (ref 1.010–1.025)
Urobilinogen, UA: 4 E.U./dL — AB

## 2016-12-01 LAB — POC MICROSCOPIC URINALYSIS (UMFC): MUCUS RE: ABSENT

## 2016-12-01 NOTE — Progress Notes (Signed)
12/01/2016 3:56 PM   DOB: [birth year] / MRN: [id number]  SUBJECTIVE:  Jasmine Malone is a 71 y.o. female presenting for diarrhea that has been present since Monday.  Assoicates abdominal cramping that is generalized "bend over double kind of pain."  Tells me that she had some gold fish that were reportedly contaiminated with E. Coli.  Denies bloody diarrhea, fever, SOB, chest pain. Does tell me that she is having dysuria that also started on Monday. Associates frequency, urgency, and incomplete bladder emptying.   Had a tick bite five days ago and it was attached.  Used alcohol to take this off and the tic detached itself. Had some itching that has resolved. Denies rash at this time. Does have abominal pain with taking augmetin.   She is allergic to codeine; ibuprofen; and shellfish allergy.   She  has a past medical history of Anxiety; Arthritis; Asthma; Cancer (HCC); Cataracts, bilateral (05/10/2012); Depression; GERD (gastroesophageal reflux disease); H. pylori infection; Heart murmur; Hepatitis; Hiatal hernia; Hyperlipidemia; Parathyroid abnormality (HCC); and PONV (postoperative nausea and vomiting).    She  reports that she has never smoked. She has never used smokeless tobacco. She reports that she drinks alcohol. She reports that she does not use drugs. She  reports that she currently engages in sexual activity. She reports using the following method of birth control/protection: Post-menopausal. The patient  has a past surgical history that includes Laparoscopic cholecystectomy (1998); Tubal ligation (1993); Parathyroidectomy (1997); Retinal detachment surgery (Right, 2012); Dilatation & curettage/hysteroscopy with trueclear (N/A, 02/20/2014); Retinal detachment surgery (Left); and Cataract extraction, bilateral.  Her family history includes Alcohol abuse in her mother; Cancer in her father; Depression in her sister and sister; Diabetes in her brother, father, sister, and sister; Kidney failure  in her brother; Mental illness in her father and mother; Stroke (age of onset: 38) in her mother.  ROS  Per HPI.   The problem list and medications were reviewed and updated by myself where necessary and exist elsewhere in the encounter.   OBJECTIVE:  BP 136/81 (BP Location: Right Arm, Patient Position: Sitting, Cuff Size: Large)   Pulse 73   Temp 97.9 F (36.6 C) (Oral)   Resp 18   Ht 5' 5.35" (1.66 m)   Wt 225 lb (102.1 kg)   SpO2 98%   BMI 37.04 kg/m   Physical Exam  Constitutional: She is active.  Non-toxic appearance.  Cardiovascular: Normal rate, regular rhythm, S1 normal, S2 normal, normal heart sounds and intact distal pulses.  Exam reveals no gallop, no friction rub and no decreased pulses.   No murmur heard. Pulmonary/Chest: Effort normal. No stridor. No tachypnea. No respiratory distress. She has no wheezes. She has no rales.  Abdominal: Soft. Normal appearance and bowel sounds are normal. She exhibits no distension and no mass. There is no tenderness. There is no rigidity, no rebound, no guarding and no CVA tenderness.  Musculoskeletal: She exhibits no edema.  Neurological: She is alert.  Skin: Skin is warm and dry. She is not diaphoretic. No pallor.    Lab Results  Component Value Date   WBC 7.5 12/01/2016   HGB 15.2 12/01/2016   HCT 45.1 12/01/2016   MCV 94.7 12/01/2016   PLT 225 02/15/2014    Lab Results  Component Value Date   NA 143 11/26/2015   K 4.0 11/26/2015   CL 107 11/26/2015   CO2 21 11/26/2015    Lab Results  Component Value Date   CREATININE 0.76  11/26/2015    Lab Results  Component Value Date   ALT 16 11/26/2015   AST 18 11/26/2015   ALKPHOS 74 11/26/2015   BILITOT 0.8 11/26/2015    Lab Results  Component Value Date   TSH 2.986 04/25/2013    Lab Results  Component Value Date   HGBA1C 5.5 04/25/2013    Lab Results  Component Value Date   CHOL 183 09/25/2014   HDL 55 09/25/2014   LDLCALC 97 09/25/2014   TRIG 157 (H)  09/25/2014   CHOLHDL 3.3 09/25/2014      Results for orders placed or performed in visit on 12/01/16 (from the past 72 hour(s))  POCT CBC     Status: Abnormal   Collection Time: 12/01/16  2:33 PM  Result Value Ref Range   WBC 7.5 4.6 - 10.2 K/uL   Lymph, poc 3.5 (A) 0.6 - 3.4   POC LYMPH PERCENT 46.6 10 - 50 %L   MID (cbc) 0.3 0 - 0.9   POC MID % 3.4 0 - 12 %M   POC Granulocyte 3.8 2 - 6.9   Granulocyte percent 50.0 37 - 80 %G   RBC 4.76 4.04 - 5.48 M/uL   Hemoglobin 15.2 12.2 - 16.2 g/dL   HCT, POC 16.1 09.6 - 47.9 %   MCV 94.7 80 - 97 fL   MCH, POC 31.9 (A) 27 - 31.2 pg   MCHC 33.7 31.8 - 35.4 g/dL   RDW, POC 04.5 %   Platelet Count, POC 208 142 - 424 K/uL   MPV 8.4 0 - 99.8 fL  POCT urinalysis dipstick     Status: Abnormal   Collection Time: 12/01/16  2:41 PM  Result Value Ref Range   Color, UA yellow yellow   Clarity, UA clear clear   Glucose, UA negative negative mg/dL   Bilirubin, UA negative negative   Ketones, POC UA negative negative mg/dL   Spec Grav, UA 4.098 1.191 - 1.025   Blood, UA trace-intact (A) negative   pH, UA 6.0 5.0 - 8.0   Protein Ur, POC negative negative mg/dL   Urobilinogen, UA 4.0 (A) 0.2 or 1.0 E.U./dL   Nitrite, UA Negative Negative   Leukocytes, UA Negative Negative  POCT Microscopic Urinalysis ([facility])     Status: Abnormal   Collection Time: 12/01/16  3:15 PM  Result Value Ref Range   WBC,UR,HPF,POC None None WBC/hpf   RBC,UR,HPF,POC None None RBC/hpf   Bacteria None None, Too numerous to count   Mucus Absent Absent   Epithelial Cells, UR Per Microscopy Few (A) None, Too numerous to count cells/hpf    No results found.  ASSESSMENT AND PLAN:  Jasmine Malone was seen today for nausea.  Diagnoses and all orders for this visit:  Dysuria: Labs reassuring as is exam.  Advised that we hold on therapies for now until the urine and stool pcr result.  If this is e. Coli will keep HUS in mind before treating.  -     POCT urinalysis  dipstick  Abdominal cramping -     POCT CBC  Diarrhea of presumed infectious origin -     GI Profile, Stool, PCR    The patient is advised to call or return to clinic if she does not see an improvement in symptoms, or to seek the care of the closest emergency department if she worsens with the above plan.   Deliah Boston, MHS, PA-C Primary Care at [facility] 12/01/2016 3:56 PM

## 2016-12-01 NOTE — Patient Instructions (Addendum)
  Your labs and exam are reassuring.  Lets hold on medications until we know the results of you stool sample. I am culturing your urine.  If you symptoms change then please contact me or come back to clinic.     IF you received an x-ray today, you will receive an invoice from [facility]. Please contact [facility] at [phone] with questions or concerns regarding your invoice.   IF you received labwork today, you will receive an invoice from [facility]. Please contact [facility] at [phone] with questions or concerns regarding your invoice.   Our billing staff will not be able to assist you with questions regarding bills from these companies.  You will be contacted with the lab results as soon as they are available. The fastest way to get your results is to activate your My Chart account. Instructions are located on the last page of this paperwork. If you have not heard from [place] regarding the results in 2 weeks, please contact this office.

## 2016-12-03 ENCOUNTER — Other Ambulatory Visit: Payer: Self-pay | Admitting: Physician Assistant

## 2016-12-03 DIAGNOSIS — N3000 Acute cystitis without hematuria: Secondary | ICD-10-CM

## 2016-12-03 LAB — URINE CULTURE

## 2016-12-03 MED ORDER — CEPHALEXIN 500 MG PO CAPS: 500.0000 mg | ORAL_CAPSULE | Freq: Three times a day (TID) | ORAL | 0 refills | Status: AC

## 2016-12-03 NOTE — Progress Notes (Signed)
Results for orders placed or performed in visit on 12/01/16  Urine Culture  Result Value Ref Range   Urine Culture, Routine Preliminary report (A)    Organism ID, Bacteria Gram negative rods (A)   POCT CBC  Result Value Ref Range   WBC 7.5 4.6 - 10.2 K/uL   Lymph, poc 3.5 (A) 0.6 - 3.4   POC LYMPH PERCENT 46.6 10 - 50 %L   MID (cbc) 0.3 0 - 0.9   POC MID % 3.4 0 - 12 %M   POC Granulocyte 3.8 2 - 6.9   Granulocyte percent 50.0 37 - 80 %G   RBC 4.76 4.04 - 5.48 M/uL   Hemoglobin 15.2 12.2 - 16.2 g/dL   HCT, POC 45.1 37.7 - 47.9 %   MCV 94.7 80 - 97 fL   MCH, POC 31.9 (A) 27 - 31.2 pg   MCHC 33.7 31.8 - 35.4 g/dL   RDW, POC 14.1 %   Platelet Count, POC 208 142 - 424 K/uL   MPV 8.4 0 - 99.8 fL  POCT urinalysis dipstick  Result Value Ref Range   Color, UA yellow yellow   Clarity, UA clear clear   Glucose, UA negative negative mg/dL   Bilirubin, UA negative negative   Ketones, POC UA negative negative mg/dL   Spec Grav, UA 1.015 1.010 - 1.025   Blood, UA trace-intact (A) negative   pH, UA 6.0 5.0 - 8.0   Protein Ur, POC negative negative mg/dL   Urobilinogen, UA 4.0 (A) 0.2 or 1.0 E.U./dL   Nitrite, UA Negative Negative   Leukocytes, UA Negative Negative  POCT Microscopic Urinalysis ([facility])  Result Value Ref Range   WBC,UR,HPF,POC None None WBC/hpf   RBC,UR,HPF,POC None None RBC/hpf   Bacteria None None, Too numerous to count   Mucus Absent Absent   Epithelial Cells, UR Per Microscopy Few (A) None, Too numerous to count cells/hpf

## 2016-12-06 ENCOUNTER — Telehealth: Payer: Self-pay

## 2016-12-06 LAB — GI PROFILE, STOOL, PCR
ASTROVIRUS: NOT DETECTED
Adenovirus F 40/41: NOT DETECTED
C difficile toxin A/B: NOT DETECTED
CAMPYLOBACTER: NOT DETECTED
CYCLOSPORA CAYETANENSIS: NOT DETECTED
Cryptosporidium: NOT DETECTED
ENTEROTOXIGENIC E COLI: NOT DETECTED
Entamoeba histolytica: NOT DETECTED
Enteroaggregative E coli: NOT DETECTED
Enteropathogenic E coli: NOT DETECTED
Giardia lamblia: NOT DETECTED
Norovirus GI/GII: NOT DETECTED
Plesiomonas shigelloides: NOT DETECTED
ROTAVIRUS A: NOT DETECTED
SHIGA-TOXIN-PRODUCING E COLI: NOT DETECTED
Salmonella: NOT DETECTED
Sapovirus: NOT DETECTED
Shigella/Enteroinvasive E coli: NOT DETECTED
VIBRIO: NOT DETECTED
Vibrio cholerae: NOT DETECTED
Yersinia enterocolitica: NOT DETECTED

## 2016-12-06 NOTE — Telephone Encounter (Signed)
Call placed to patient as requested regarding lab results. Patient verbalized understanding, thanked Probation officer for the call./ S.Geni Skorupski,CMA

## 2016-12-06 NOTE — Telephone Encounter (Signed)
-----   Message from Tereasa Coop, PA-C sent at 12/04/2016  2:07 PM EDT ----- Please relay negative stool results to patient. No change in plan.

## 2016-12-07 DIAGNOSIS — N3941 Urge incontinence: Secondary | ICD-10-CM | POA: Insufficient documentation

## 2016-12-07 DIAGNOSIS — E6609 Other obesity due to excess calories: Secondary | ICD-10-CM | POA: Insufficient documentation

## 2016-12-07 DIAGNOSIS — K219 Gastro-esophageal reflux disease without esophagitis: Secondary | ICD-10-CM | POA: Insufficient documentation

## 2016-12-07 DIAGNOSIS — Z6837 Body mass index (BMI) 37.0-37.9, adult: Secondary | ICD-10-CM

## 2016-12-12 ENCOUNTER — Other Ambulatory Visit: Payer: Self-pay | Admitting: Internal Medicine

## 2016-12-12 DIAGNOSIS — K219 Gastro-esophageal reflux disease without esophagitis: Secondary | ICD-10-CM

## 2016-12-16 ENCOUNTER — Telehealth: Payer: Self-pay | Admitting: Physician Assistant

## 2016-12-16 NOTE — Telephone Encounter (Signed)
Pt states that since she has been taking the cephalexin (keflex) she has been having diarrhea and vomiting.  Please adv  Contact number [phone]

## 2016-12-16 NOTE — Telephone Encounter (Signed)
See note below. Please advise.  

## 2016-12-16 NOTE — Telephone Encounter (Signed)
Let's stop the keflex and have her come back in for a recheck.

## 2016-12-22 NOTE — Telephone Encounter (Signed)
Spoke with patient, states that sx have ended and she completed the medication. Patient requesting medication for nausea./ S.Jasmine Malone,CMA

## 2017-02-02 DIAGNOSIS — H59032 Cystoid macular edema following cataract surgery, left eye: Secondary | ICD-10-CM | POA: Diagnosis not present

## 2017-02-02 DIAGNOSIS — H26493 Other secondary cataract, bilateral: Secondary | ICD-10-CM | POA: Diagnosis not present

## 2017-02-02 DIAGNOSIS — H59811 Chorioretinal scars after surgery for detachment, right eye: Secondary | ICD-10-CM | POA: Diagnosis not present

## 2017-02-02 DIAGNOSIS — H52223 Regular astigmatism, bilateral: Secondary | ICD-10-CM | POA: Diagnosis not present

## 2017-02-02 DIAGNOSIS — H5203 Hypermetropia, bilateral: Secondary | ICD-10-CM | POA: Diagnosis not present

## 2017-02-02 DIAGNOSIS — H524 Presbyopia: Secondary | ICD-10-CM | POA: Diagnosis not present

## 2017-02-02 DIAGNOSIS — Z961 Presence of intraocular lens: Secondary | ICD-10-CM | POA: Diagnosis not present

## 2017-02-02 DIAGNOSIS — H59812 Chorioretinal scars after surgery for detachment, left eye: Secondary | ICD-10-CM | POA: Diagnosis not present

## 2017-02-13 DIAGNOSIS — R69 Illness, unspecified: Secondary | ICD-10-CM | POA: Diagnosis not present

## 2017-02-28 DIAGNOSIS — R69 Illness, unspecified: Secondary | ICD-10-CM | POA: Diagnosis not present

## 2017-03-28 DIAGNOSIS — R69 Illness, unspecified: Secondary | ICD-10-CM | POA: Diagnosis not present

## 2017-04-20 DIAGNOSIS — R05 Cough: Secondary | ICD-10-CM | POA: Diagnosis not present

## 2017-04-20 DIAGNOSIS — J22 Unspecified acute lower respiratory infection: Secondary | ICD-10-CM | POA: Diagnosis not present

## 2017-04-25 DIAGNOSIS — R69 Illness, unspecified: Secondary | ICD-10-CM | POA: Diagnosis not present

## 2017-05-13 DIAGNOSIS — H59813 Chorioretinal scars after surgery for detachment, bilateral: Secondary | ICD-10-CM | POA: Diagnosis not present

## 2017-05-13 DIAGNOSIS — H33192 Other retinoschisis and retinal cysts, left eye: Secondary | ICD-10-CM | POA: Diagnosis not present

## 2017-05-13 DIAGNOSIS — H43811 Vitreous degeneration, right eye: Secondary | ICD-10-CM | POA: Diagnosis not present

## 2017-05-13 DIAGNOSIS — T85398D Other mechanical complication of other ocular prosthetic devices, implants and grafts, subsequent encounter: Secondary | ICD-10-CM | POA: Diagnosis not present

## 2017-05-23 DIAGNOSIS — R69 Illness, unspecified: Secondary | ICD-10-CM | POA: Diagnosis not present

## 2017-06-20 DIAGNOSIS — R69 Illness, unspecified: Secondary | ICD-10-CM | POA: Diagnosis not present

## 2017-07-06 ENCOUNTER — Encounter: Payer: Self-pay | Admitting: Family Medicine

## 2017-07-06 ENCOUNTER — Ambulatory Visit (INDEPENDENT_AMBULATORY_CARE_PROVIDER_SITE_OTHER): Payer: Medicare HMO | Admitting: Family Medicine

## 2017-07-06 ENCOUNTER — Other Ambulatory Visit: Payer: Self-pay

## 2017-07-06 VITALS — BP 122/82 | HR 87 | Temp 98.0°F | Resp 16 | Ht 65.75 in | Wt 238.0 lb

## 2017-07-06 DIAGNOSIS — R300 Dysuria: Secondary | ICD-10-CM | POA: Diagnosis not present

## 2017-07-06 LAB — POCT URINALYSIS DIP (MANUAL ENTRY)
Bilirubin, UA: NEGATIVE
GLUCOSE UA: NEGATIVE mg/dL
Ketones, POC UA: NEGATIVE mg/dL
Nitrite, UA: NEGATIVE
PROTEIN UA: NEGATIVE mg/dL
SPEC GRAV UA: 1.01 (ref 1.010–1.025)
UROBILINOGEN UA: 0.2 U/dL
pH, UA: 5.5 (ref 5.0–8.0)

## 2017-07-06 MED ORDER — CEPHALEXIN 500 MG PO CAPS: 500.0000 mg | ORAL_CAPSULE | Freq: Three times a day (TID) | ORAL | 0 refills | Status: DC

## 2017-07-06 MED ORDER — ONDANSETRON 8 MG PO TBDP: 8.0000 mg | ORAL_TABLET | Freq: Three times a day (TID) | ORAL | 0 refills | Status: DC | PRN

## 2017-07-06 NOTE — Progress Notes (Signed)
Subjective:    Patient ID: Jasmine Malone, female    DOB: 10-Apr-1946, 72 y.o.   MRN: [id number]  07/06/2017  Dysuria (with pressure and frequency )    HPI This 72 y.o. female presents for evaluation of dysuria; onset last night.  Up all night long.  Increase water intake.  Six glasses of water today.  No fever but +chills/sweats.  Nausea; no vomited; no back pain. No hematuria. Hesistancy. Urgency; frequency.  Vaginal irritaiton none or discharge.   Sister died of brain cancer with mets to liver and lung one year ago.     BP Readings from Last 3 Encounters:  07/06/17 122/82  12/01/16 136/81  11/26/16 124/79   Wt Readings from Last 3 Encounters:  07/06/17 238 lb (108 kg)  12/01/16 225 lb (102.1 kg)  11/26/16 229 lb (103.9 kg)   Immunization History  Administered Date(s) Administered  . Influenza, High Dose Seasonal PF 02/13/2017  . Influenza,inj,Quad PF,6+ Mos 04/25/2013  . Pneumococcal Conjugate-13 09/25/2014  . Pneumococcal Polysaccharide-23 05/11/2011  . Pneumococcal-Unspecified 05/11/2011  . Tdap 05/11/2011    Review of Systems  Constitutional: Positive for chills and diaphoresis. Negative for fatigue and fever.  HENT: Negative for ear pain, postnasal drip, rhinorrhea, sinus pressure, sore throat and trouble swallowing.   Respiratory: Negative for cough and shortness of breath.   Cardiovascular: Negative for chest pain, palpitations and leg swelling.  Gastrointestinal: Positive for nausea. Negative for abdominal pain, constipation, diarrhea and vomiting.  Genitourinary: Positive for dysuria, frequency, pelvic pain and urgency. Negative for decreased urine volume, difficulty urinating, dyspareunia, enuresis, flank pain, hematuria, menstrual problem, vaginal bleeding, vaginal discharge and vaginal pain.    Past Medical History:  Diagnosis Date  . Anxiety    parental strain; previous medicaiton; ;followed by therapist once per month; Nan Hyball in [facility].  Marland Kitchen Arthritis     . Asthma    Albuterol use prior to exercise.  No hospitalizations or ED visits.  . Cancer (HCC)    squamous cell cancer  . Cataracts, bilateral 05/10/2012   s/p B cataract resection.  . Depression   . GERD (gastroesophageal reflux disease)   . H. pylori infection   . Heart murmur   . Hepatitis    Type A as a child  . Hiatal hernia   . Hyperlipidemia   . Parathyroid abnormality (HCC)   . PONV (postoperative nausea and vomiting)    Past Surgical History:  Procedure Laterality Date  . CATARACT EXTRACTION, BILATERAL    . DILATATION & CURETTAGE/HYSTEROSCOPY WITH TRUECLEAR N/A 02/20/2014   Procedure: DILATATION & CURETTAGE/HYSTEROSCOPY WITH TRUCLEAR;  Surgeon: Meriel Pica, MD;  Location: [facility];  Service: Gynecology;  Laterality: N/A;  . LAPAROSCOPIC CHOLECYSTECTOMY  1998  . PARATHYROIDECTOMY  1997   3 resected.    Marland Kitchen RETINAL DETACHMENT SURGERY Right 2012  . RETINAL DETACHMENT SURGERY Left    x 4  . TUBAL LIGATION  1993   Allergies  Allergen Reactions  . Codeine Nausea Only    Can take Percocet with Zofran  . Ibuprofen Other (See Comments)    Irritates stomach due to hiatal hernia   . Shellfish Allergy Nausea Only   Current Outpatient Medications on File Prior to Visit  Medication Sig Dispense Refill  . albuterol (PROVENTIL HFA;VENTOLIN HFA) 108 (90 Base) MCG/ACT inhaler Inhale 2 puffs into the lungs every 6 (six) hours as needed for wheezing or shortness of breath (cough, shortness of breath or wheezing.). 1 Inhaler 1  .  Carboxymethylcellul-Glycerin (OPTIVE) 0.5-0.9 % SOLN Apply to eye.    Marland Kitchen FENUGREEK PO Take 1 capsule by mouth daily as needed (for congestion).    Stephanie Acre (MSM GLUCOSAMINE COMPLEX PO) Take 1 capsule by mouth daily.     Marland Kitchen ipratropium (ATROVENT) 0.03 % nasal spray Place 2 sprays into both nostrils 2 (two) times daily. 30 mL 0  . Multiple Vitamin (MULTIVITAMIN WITH MINERALS) TABS tablet Take 1 tablet by mouth daily.    . Omega-3  Fatty Acids (FISH OIL PO) Take 2 capsules by mouth daily.     Marland Kitchen oxybutynin (DITROPAN-XL) 5 MG 24 hr tablet Take 1 tablet (5 mg total) by mouth at bedtime. 90 tablet 1  . pantoprazole (PROTONIX) 40 MG tablet TAKE 1 TABLET (40 MG TOTAL) BY MOUTH DAILY. 30 tablet 3   Current Facility-Administered Medications on File Prior to Visit  Medication Dose Route Frequency Provider Last Rate Last Dose  . 0.9 %  sodium chloride infusion  500 mL Intravenous Continuous Pyrtle, Carie Caddy, MD       Social History   Socioeconomic History  . Marital status: Married    Spouse name: Not on file  . Number of children: Not on file  . Years of education: Not on file  . Highest education level: Not on file  Social Needs  . Financial resource strain: Not on file  . Food insecurity - worry: Not on file  . Food insecurity - inability: Not on file  . Transportation needs - medical: Not on file  . Transportation needs - non-medical: Not on file  Occupational History  . Not on file  Tobacco Use  . Smoking status: Never Smoker  . Smokeless tobacco: Never Used  Substance and Sexual Activity  . Alcohol use: Yes    Comment: occasional glass of wine  . Drug use: No  . Sexual activity: Yes    Birth control/protection: Post-menopausal  Other Topics Concern  . Not on file  Social History Narrative   Marital status: married x 45 years; happily married; no abuse.       Children:  4 children (2 girls 2 boys 3, 38, 35, 30); 6 grandchildren.      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      Lives: with husband; daughter moving out.      Tobacco:  None      Alcohol: wine weekends; mixed drinks at restaurant.     Exercise: [employer] two days per week. Weights.       Seatbelt:  100%; no texting     ADLs: driving; performs all ADLs; no assistant devices.      Pets: 2 dogs and 1 cat.      Advanced Directives: none; bought a will kit to complete.  FULL CODE but no prolonged measures.          Family History  Problem Relation Age of Onset  . Stroke Mother 41       CVA; tobacco abuse.  . Mental illness Mother   . Alcohol abuse Mother   . Diabetes Father   . Cancer Father        brain cancer/malignancy  . Mental illness Father   . Diabetes Brother   . Kidney failure Brother   . Diabetes Sister   . Depression Sister   . Depression Sister   . Diabetes Sister   . Colon cancer Neg Hx  Objective:    BP 122/82   Pulse 87   Temp 98 F (36.7 C) (Oral)   Resp 16   Ht 5' 5.75" (1.67 m)   Wt 238 lb (108 kg)   SpO2 96%   BMI 38.71 kg/m  Physical Exam  Constitutional: She is oriented to person, place, and time. She appears well-developed and well-nourished. No distress.  HENT:  Head: Normocephalic and atraumatic.  Eyes: Conjunctivae are normal. Pupils are equal, round, and reactive to light.  Neck: Normal range of motion. Neck supple.  Cardiovascular: Normal rate, regular rhythm and normal heart sounds. Exam reveals no gallop and no friction rub.  No murmur heard. Pulmonary/Chest: Effort normal and breath sounds normal. She has no wheezes. She has no rales.  Abdominal: Soft. Bowel sounds are normal. She exhibits no distension and no mass. There is no tenderness. There is no rebound, no guarding and no CVA tenderness.  Neurological: She is alert and oriented to person, place, and time.  Skin: She is not diaphoretic.  Psychiatric: She has a normal mood and affect. Her behavior is normal.  Nursing note and vitals reviewed.  No results found. Depression screen [facility] 2/9 07/06/2017 12/01/2016 11/26/2016 04/07/2016 11/26/2015  Decreased Interest 0 0 0 0 0  Down, Depressed, Hopeless 0 0 0 0 0  PHQ - 2 Score 0 0 0 0 0   Fall Risk  07/06/2017 12/01/2016 11/26/2016 04/07/2016 11/26/2015  Falls in the past year? No No No No Yes  Number falls in past yr: - - - - 1  Injury with Fall? - - - - No   Results for orders placed or performed in visit on 07/06/17  POCT  urinalysis dipstick  Result Value Ref Range   Color, UA yellow yellow   Clarity, UA cloudy (A) clear   Glucose, UA negative negative mg/dL   Bilirubin, UA negative negative   Ketones, POC UA negative negative mg/dL   Spec Grav, UA 1.308 6.578 - 1.025   Blood, UA large (A) negative   pH, UA 5.5 5.0 - 8.0   Protein Ur, POC negative negative mg/dL   Urobilinogen, UA 0.2 0.2 or 1.0 E.U./dL   Nitrite, UA Negative Negative   Leukocytes, UA Doerner (1+) (A) Negative        Assessment & Plan:   1. Dysuria     New onset.  Send urine culture.  Treat empirically with cephalexin 500 mg 3 times daily.  Prescription for Zofran also provided for nausea.  Return to clinic for fever, vomiting, severe back pain.  Orders Placed This Encounter  Procedures  . Urine Culture    Order Specific Question:   Source    Answer:   clean catch  . Urine Microscopic  . POCT urinalysis dipstick   Meds ordered this encounter  Medications  . cephALEXin (KEFLEX) 500 MG capsule    Sig: Take 1 capsule (500 mg total) by mouth 3 (three) times daily.    Dispense:  21 capsule    Refill:  0  . ondansetron (ZOFRAN-ODT) 8 MG disintegrating tablet    Sig: Take 1 tablet (8 mg total) by mouth every 8 (eight) hours as needed for nausea.    Dispense:  20 tablet    Refill:  0    No Follow-up on file.   Damarien Nyman Paulita Fujita, M.D. Primary Care at [facility] previously [facility] [address] [place], [place]  [address] [phone] phone [phone] fax

## 2017-07-06 NOTE — Patient Instructions (Addendum)
     IF you received an x-ray today, you will receive an invoice from [facility]. Please contact [facility] at [phone] with questions or concerns regarding your invoice.   IF you received labwork today, you will receive an invoice from [facility]. Please contact [facility] at [phone] with questions or concerns regarding your invoice.   Our billing staff will not be able to assist you with questions regarding bills from these companies.  You will be contacted with the lab results as soon as they are available. The fastest way to get your results is to activate your My Chart account. Instructions are located on the last page of this paperwork. If you have not heard from us regarding the results in 2 weeks, please contact this office.     Urinary Tract Infection, Adult A urinary tract infection (UTI) is an infection of any part of the urinary tract. The urinary tract includes the:  Kidneys.  Ureters.  Bladder.  Urethra.  These organs make, store, and get rid of pee (urine) in the body. Follow these instructions at home:  Take over-the-counter and prescription medicines only as told by your doctor.  If you were prescribed an antibiotic medicine, take it as told by your doctor. Do not stop taking the antibiotic even if you start to feel better.  Avoid the following drinks: ? Alcohol. ? Caffeine. ? Tea. ? Carbonated drinks.  Drink enough fluid to keep your pee clear or pale yellow.  Keep all follow-up visits as told by your doctor. This is important.  Make sure to: ? Empty your bladder often and completely. Do not to hold pee for long periods of time. ? Empty your bladder before and after sex. ? Wipe from front to back after a bowel movement if you are female. Use each tissue one time when you wipe. Contact a doctor if:  You have back pain.  You have a fever.  You feel sick to your stomach (nauseous).  You throw up (vomit).  Your symptoms do  not get better after 3 days.  Your symptoms go away and then come back. Get help right away if:  You have very bad back pain.  You have very bad lower belly (abdominal) pain.  You are throwing up and cannot keep down any medicines or water. This information is not intended to replace advice given to you by your health care provider. Make sure you discuss any questions you have with your health care provider. Document Released: 10/13/2007 Document Revised: 10/02/2015 Document Reviewed: 03/17/2015 Elsevier Interactive Patient Education  2018 Elsevier Inc.  

## 2017-07-07 LAB — URINALYSIS, MICROSCOPIC ONLY
CASTS: NONE SEEN /LPF
WBC, UA: >30 /hpf — AB (ref 0–?)

## 2017-07-09 LAB — URINE CULTURE

## 2017-07-18 DIAGNOSIS — R69 Illness, unspecified: Secondary | ICD-10-CM | POA: Diagnosis not present

## 2017-08-22 DIAGNOSIS — R69 Illness, unspecified: Secondary | ICD-10-CM | POA: Diagnosis not present

## 2017-09-19 DIAGNOSIS — R69 Illness, unspecified: Secondary | ICD-10-CM | POA: Diagnosis not present

## 2017-10-04 ENCOUNTER — Encounter: Payer: Self-pay | Admitting: Family Medicine

## 2017-10-17 DIAGNOSIS — R69 Illness, unspecified: Secondary | ICD-10-CM | POA: Diagnosis not present

## 2017-11-21 DIAGNOSIS — R69 Illness, unspecified: Secondary | ICD-10-CM | POA: Diagnosis not present

## 2017-12-12 ENCOUNTER — Other Ambulatory Visit: Payer: Self-pay

## 2017-12-12 ENCOUNTER — Encounter: Payer: Self-pay | Admitting: Emergency Medicine

## 2017-12-12 ENCOUNTER — Ambulatory Visit
Admission: EM | Admit: 2017-12-12 | Discharge: 2017-12-12 | Disposition: A | Discharge status: Home / Self Care | Payer: Medicare HMO | Attending: Family Medicine | Admitting: Family Medicine

## 2017-12-12 DIAGNOSIS — R059 Cough, unspecified: Secondary | ICD-10-CM

## 2017-12-12 DIAGNOSIS — R05 Cough: Secondary | ICD-10-CM

## 2017-12-12 DIAGNOSIS — J0100 Acute maxillary sinusitis, unspecified: Secondary | ICD-10-CM

## 2017-12-12 MED ORDER — AMOXICILLIN 875 MG PO TABS: 875.0000 mg | ORAL_TABLET | Freq: Two times a day (BID) | ORAL | 0 refills | Status: DC

## 2017-12-12 NOTE — ED Triage Notes (Signed)
Patient c/o cough and congestion and nasal congestion x 2 weeks. Patient has taken OTC Mucinex with temporary relief.

## 2017-12-12 NOTE — ED Provider Notes (Signed)
[facility]    CSN: [id number] Arrival date & time: 12/12/17  1524     History   Chief Complaint Chief Complaint  Patient presents with  . Cough  . Nasal Congestion    HPI Jasmine Malone is a 72 y.o. female.   The history is provided by the patient.  Cough  URI  Presenting symptoms: congestion, cough and facial pain   Severity:  Moderate Onset quality:  Sudden Duration:  2 weeks Timing:  Constant Progression:  Worsening Chronicity:  New Relieved by:  Nothing Worsened by:  Nothing Associated symptoms: sinus pain   Risk factors: not elderly, no chronic cardiac disease, no chronic kidney disease, no chronic respiratory disease, no diabetes mellitus, no immunosuppression, no recent illness, no recent travel and no sick contacts     Past Medical History:  Diagnosis Date  . Anxiety    parental strain; previous medicaiton; ;followed by therapist once per month; Nan Hyball in [facility].  Marland Kitchen Arthritis   . Asthma    Albuterol use prior to exercise.  No hospitalizations or ED visits.  . Cancer ([place])    squamous cell cancer  . Cataracts, bilateral 05/10/2012   s/p B cataract resection.  . Depression   . GERD (gastroesophageal reflux disease)   . H. pylori infection   . Heart murmur   . Hepatitis    Type A as a child  . Hiatal hernia   . Hyperlipidemia   . Parathyroid abnormality ([place])   . PONV (postoperative nausea and vomiting)     Patient Active Problem List   Diagnosis Date Noted  . Class 2 obesity due to excess calories without serious comorbidity with body mass index (BMI) of 37.0 to 37.9 in adult 12/07/2016  . Gastroesophageal reflux disease without esophagitis 12/07/2016  . Urge incontinence of urine 12/07/2016  . Post-menopausal bleeding 02/13/2014    Past Surgical History:  Procedure Laterality Date  . CATARACT EXTRACTION, BILATERAL    . DILATATION & CURETTAGE/HYSTEROSCOPY WITH TRUECLEAR N/A 02/20/2014   Procedure: DILATATION &  CURETTAGE/HYSTEROSCOPY WITH TRUCLEAR;  Surgeon: Margarette Asal, MD;  Location: [facility];  Service: Gynecology;  Laterality: N/A;  . [place]  . PARATHYROIDECTOMY  1997   3 resected.    Marland Kitchen RETINAL DETACHMENT SURGERY Right 2012  . RETINAL DETACHMENT SURGERY Left    x 4  . TUBAL LIGATION  1993    OB History   None      Home Medications    Prior to Admission medications   Medication Sig Start Date End Date Taking? Authorizing Provider  albuterol (PROVENTIL HFA;VENTOLIN HFA) 108 (90 Base) MCG/ACT inhaler Inhale 2 puffs into the lungs every 6 (six) hours as needed for wheezing or shortness of breath (cough, shortness of breath or wheezing.). 04/07/16  Yes Scot Jun, FNP  Carboxymethylcellul-Glycerin (OPTIVE) 0.5-0.9 % SOLN Apply to eye.   Yes [provider]  FENUGREEK PO Take 1 capsule by mouth daily as needed (for congestion).   Yes [provider]  Glucos-MSM-C-Mn-Ginger-Willow (MSM GLUCOSAMINE COMPLEX PO) Take 1 capsule by mouth daily.    Yes [provider]  Multiple Vitamin (MULTIVITAMIN WITH MINERALS) TABS tablet Take 1 tablet by mouth daily.   Yes [provider]  Omega-3 Fatty Acids (FISH OIL PO) Take 2 capsules by mouth daily.    Yes [provider]  pantoprazole (PROTONIX) 40 MG tablet TAKE 1 TABLET (40 MG TOTAL) BY MOUTH DAILY. 12/13/16  Yes Pyrtle, Ulice Dash  M, MD  amoxicillin (AMOXIL) 875 MG tablet Take 1 tablet (875 mg total) by mouth 2 (two) times daily. 12/12/17   Norval Gable, MD  cephALEXin (KEFLEX) 500 MG capsule Take 1 capsule (500 mg total) by mouth 3 (three) times daily. 07/06/17   Wardell Honour, MD  ipratropium (ATROVENT) 0.03 % nasal spray Place 2 sprays into both nostrils 2 (two) times daily. 04/07/16   Scot Jun, FNP  ondansetron (ZOFRAN-ODT) 8 MG disintegrating tablet Take 1 tablet (8 mg total) by mouth every 8 (eight) hours as needed for nausea. 07/06/17   Wardell Honour, MD    oxybutynin (DITROPAN-XL) 5 MG 24 hr tablet Take 1 tablet (5 mg total) by mouth at bedtime. 11/26/16   Wardell Honour, MD    Family History Family History  Problem Relation Age of Onset  . Stroke Mother 80       CVA; tobacco abuse.  . Mental illness Mother   . Alcohol abuse Mother   . Diabetes Father   . Cancer Father        brain cancer/malignancy  . Mental illness Father   . Diabetes Brother   . Kidney failure Brother   . Diabetes Sister   . Depression Sister   . Depression Sister   . Diabetes Sister   . Colon cancer Neg Hx     Social History Social History   Tobacco Use  . Smoking status: Never Smoker  . Smokeless tobacco: Never Used  Substance Use Topics  . Alcohol use: Yes    Comment: occasional glass of wine  . Drug use: No     Allergies   Codeine; Ibuprofen; and Shellfish allergy   Review of Systems Review of Systems  HENT: Positive for congestion and sinus pain.   Respiratory: Positive for cough.      Physical Exam Triage Vital Signs ED Triage Vitals  Enc Vitals Group     BP 12/12/17 1541 (!) 132/57     Pulse Rate 12/12/17 1541 70     Resp 12/12/17 1541 18     Temp 12/12/17 1541 98.6 F (37 C)     Temp Source 12/12/17 1541 Oral     SpO2 12/12/17 1541 97 %     Weight 12/12/17 1538 230 lb (104.3 kg)     Height 12/12/17 1538 5\' 6"  (1.676 m)     Head Circumference --      Peak Flow --      Pain Score 12/12/17 1538 0     Pain Loc --      Pain Edu? --      Excl. in [place]? --    No data found.  Updated Vital Signs BP (!) 132/57 (BP Location: Right Arm)   Pulse 70   Temp 98.6 F (37 C) (Oral)   Resp 18   Ht 5\' 6"  (1.676 m)   Wt 230 lb (104.3 kg)   SpO2 97%   BMI 37.12 kg/m   Visual Acuity Right Eye Distance:   Left Eye Distance:   Bilateral Distance:    Right Eye Near:   Left Eye Near:    Bilateral Near:     Physical Exam  Constitutional: She appears well-developed and well-nourished. No distress.  HENT:  Head: Normocephalic  and atraumatic.  Right Ear: Tympanic membrane, external ear and ear canal normal.  Left Ear: Tympanic membrane, external ear and ear canal normal.  Nose: Mucosal edema and rhinorrhea present. No nose lacerations, sinus tenderness,  nasal deformity, septal deviation or nasal septal hematoma. No epistaxis.  No foreign bodies. Right sinus exhibits maxillary sinus tenderness and frontal sinus tenderness. Left sinus exhibits maxillary sinus tenderness and frontal sinus tenderness.  Mouth/Throat: Uvula is midline, oropharynx is clear and moist and mucous membranes are normal. No oropharyngeal exudate.  Eyes: Pupils are equal, round, and reactive to light. Conjunctivae and EOM are normal. Right eye exhibits no discharge. Left eye exhibits no discharge. No scleral icterus.  Neck: Normal range of motion. Neck supple. No thyromegaly present.  Cardiovascular: Normal rate, regular rhythm and normal heart sounds.  Pulmonary/Chest: Effort normal and breath sounds normal. No stridor. No respiratory distress. She has no wheezes. She has no rales.  Lymphadenopathy:    She has no cervical adenopathy.  Skin: She is not diaphoretic.  Nursing note and vitals reviewed.    UC Treatments / Results  Labs (all labs ordered are listed, but only abnormal results are displayed) Labs Reviewed - No data to display  EKG None  Radiology No results found.  Procedures Procedures (including critical care time)  Medications Ordered in UC Medications - No data to display  Initial Impression / Assessment and Plan / UC Course  I have reviewed the triage vital signs and the nursing notes.  Pertinent labs & imaging results that were available during my care of the patient were reviewed by me and considered in my medical decision making (see chart for details).      Final Clinical Impressions(s) / UC Diagnoses   Final diagnoses:  Acute maxillary sinusitis, recurrence not specified  Cough   Discharge Instructions    None    ED Prescriptions    Medication Sig Dispense Auth. Provider   amoxicillin (AMOXIL) 875 MG tablet Take 1 tablet (875 mg total) by mouth 2 (two) times daily. 20 tablet Norval Gable, MD     1. diagnosis reviewed with patient 2. rx as per orders above; reviewed possible side effects, interactions, risks and benefits  3. Recommend supportive treatment with otc flonase 4. Follow-up prn if symptoms worsen or don't improve   Controlled Substance Prescriptions [place] Controlled Substance Registry consulted? Not Applicable   Norval Gable, MD 12/12/17 [phone]

## 2017-12-19 DIAGNOSIS — R69 Illness, unspecified: Secondary | ICD-10-CM | POA: Diagnosis not present

## 2018-01-12 ENCOUNTER — Other Ambulatory Visit: Payer: Self-pay

## 2018-01-12 ENCOUNTER — Ambulatory Visit
Admission: EM | Admit: 2018-01-12 | Discharge: 2018-01-12 | Disposition: A | Discharge status: Home / Self Care | Payer: Medicare HMO | Attending: Family Medicine | Admitting: Family Medicine

## 2018-01-12 ENCOUNTER — Encounter: Payer: Self-pay | Admitting: Emergency Medicine

## 2018-01-12 DIAGNOSIS — N390 Urinary tract infection, site not specified: Secondary | ICD-10-CM | POA: Diagnosis not present

## 2018-01-12 LAB — URINALYSIS, COMPLETE (UACMP) WITH MICROSCOPIC
BILIRUBIN URINE: NEGATIVE
Glucose, UA: NEGATIVE mg/dL
KETONES UR: NEGATIVE mg/dL
NITRITE: NEGATIVE
PH: 5 (ref 5.0–8.0)
PROTEIN: NEGATIVE mg/dL
Specific Gravity, Urine: 1.025 (ref 1.005–1.030)

## 2018-01-12 MED ORDER — ONDANSETRON 8 MG PO TBDP: 8.0000 mg | ORAL_TABLET | Freq: Two times a day (BID) | ORAL | 0 refills | Status: DC

## 2018-01-12 MED ORDER — PHENAZOPYRIDINE HCL 200 MG PO TABS: 200.0000 mg | ORAL_TABLET | Freq: Three times a day (TID) | ORAL | 0 refills | Status: DC

## 2018-01-12 MED ORDER — CEPHALEXIN 500 MG PO CAPS: 500.0000 mg | ORAL_CAPSULE | Freq: Two times a day (BID) | ORAL | 0 refills | Status: DC

## 2018-01-12 NOTE — Discharge Instructions (Signed)
Drink plenty of fluids.  If you have nausea and vomiting fever worsening symptoms go immediately to the emergency room.

## 2018-01-12 NOTE — ED Triage Notes (Signed)
Patient c/o burning when urinating, nausea, and abdominal cramping that started today  Patient denies vomiting.

## 2018-01-12 NOTE — ED Provider Notes (Signed)
[facility]    CSN: [id number] Arrival date & time: 01/12/18  1438     History   Chief Complaint Chief Complaint  Patient presents with  . Abdominal Pain  . Dysuria  . Nausea    HPI Jasmine Malone is a 72 y.o. female.   HPI  -year-old female presents with dysuria described as burning ,nausea without vomiting,abdominal cramping in the lower abdomen  That started today.  She is had no vomiting.  Had previous UTIs and this feels very similar.  Also complains of frequency urgency and  insatiety.          Past Medical History:  Diagnosis Date  . Anxiety    parental strain; previous medicaiton; ;followed by therapist once per month; Nan Hyball in [facility].  Marland Kitchen Arthritis   . Asthma    Albuterol use prior to exercise.  No hospitalizations or ED visits.  . Cancer ([place])    squamous cell cancer  . Cataracts, bilateral 05/10/2012   s/p B cataract resection.  . Depression   . GERD (gastroesophageal reflux disease)   . H. pylori infection   . Heart murmur   . Hepatitis    Type A as a child  . Hiatal hernia   . Hyperlipidemia   . Parathyroid abnormality ([place])   . PONV (postoperative nausea and vomiting)     Patient Active Problem List   Diagnosis Date Noted  . Class 2 obesity due to excess calories without serious comorbidity with body mass index (BMI) of 37.0 to 37.9 in adult 12/07/2016  . Gastroesophageal reflux disease without esophagitis 12/07/2016  . Urge incontinence of urine 12/07/2016  . Post-menopausal bleeding 02/13/2014    Past Surgical History:  Procedure Laterality Date  . CATARACT EXTRACTION, BILATERAL    . DILATATION & CURETTAGE/HYSTEROSCOPY WITH TRUECLEAR N/A 02/20/2014   Procedure: DILATATION & CURETTAGE/HYSTEROSCOPY WITH TRUCLEAR;  Surgeon: Margarette Asal, MD;  Location: [facility];  Service: Gynecology;  Laterality: N/A;  . [place]  . PARATHYROIDECTOMY  1997   3 resected.    Marland Kitchen RETINAL DETACHMENT SURGERY Right 2012    . RETINAL DETACHMENT SURGERY Left    x 4  . TUBAL LIGATION  1993    OB History   None      Home Medications    Prior to Admission medications   Medication Sig Start Date End Date Taking? Authorizing Provider  Multiple Vitamin (MULTIVITAMIN WITH MINERALS) TABS tablet Take 1 tablet by mouth daily.   Yes [provider]  Omega-3 Fatty Acids (FISH OIL PO) Take 2 capsules by mouth daily.    Yes [provider]  pantoprazole (PROTONIX) 40 MG tablet TAKE 1 TABLET (40 MG TOTAL) BY MOUTH DAILY. 12/13/16  Yes Pyrtle, Lajuan Lines, MD  albuterol (PROVENTIL HFA;VENTOLIN HFA) 108 (90 Base) MCG/ACT inhaler Inhale 2 puffs into the lungs every 6 (six) hours as needed for wheezing or shortness of breath (cough, shortness of breath or wheezing.). 04/07/16   Scot Jun, FNP  Carboxymethylcellul-Glycerin (OPTIVE) 0.5-0.9 % SOLN Apply to eye.    [provider]  cephALEXin (KEFLEX) 500 MG capsule Take 1 capsule (500 mg total) by mouth 2 (two) times daily. 01/12/18   Lorin Picket, PA-C  FENUGREEK PO Take 1 capsule by mouth daily as needed (for congestion).    [provider]  Glucos-MSM-C-Mn-Ginger-Willow (MSM GLUCOSAMINE COMPLEX PO) Take 1 capsule by mouth daily.     [provider]  ipratropium (ATROVENT) 0.03 %  nasal spray Place 2 sprays into both nostrils 2 (two) times daily. 04/07/16   Scot Jun, FNP  ondansetron (ZOFRAN ODT) 8 MG disintegrating tablet Take 1 tablet (8 mg total) by mouth 2 (two) times daily. 01/12/18   Lorin Picket, PA-C  oxybutynin (DITROPAN-XL) 5 MG 24 hr tablet Take 1 tablet (5 mg total) by mouth at bedtime. 11/26/16   Wardell Honour, MD  phenazopyridine (PYRIDIUM) 200 MG tablet Take 1 tablet (200 mg total) by mouth 3 (three) times daily. 01/12/18   Lorin Picket, PA-C    Family History Family History  Problem Relation Age of Onset  . Stroke Mother 16       CVA; tobacco abuse.  . Mental illness Mother   . Alcohol  abuse Mother   . Diabetes Father   . Cancer Father        brain cancer/malignancy  . Mental illness Father   . Diabetes Brother   . Kidney failure Brother   . Diabetes Sister   . Depression Sister   . Depression Sister   . Diabetes Sister   . Colon cancer Neg Hx     Social History Social History   Tobacco Use  . Smoking status: Never Smoker  . Smokeless tobacco: Never Used  Substance Use Topics  . Alcohol use: Yes    Comment: occasional glass of wine  . Drug use: No     Allergies   Codeine; Ibuprofen; and Shellfish allergy   Review of Systems Review of Systems  Constitutional: Positive for activity change, appetite change and chills. Negative for fatigue and fever.  Genitourinary: Positive for decreased urine volume, dysuria, frequency and urgency.  All other systems reviewed and are negative.    Physical Exam Triage Vital Signs ED Triage Vitals  Enc Vitals Group     BP 01/12/18 1455 (!) 148/74     Pulse Rate 01/12/18 1455 73     Resp 01/12/18 1455 16     Temp 01/12/18 1455 98.4 F (36.9 C)     Temp Source 01/12/18 1455 Oral     SpO2 01/12/18 1455 97 %     Weight 01/12/18 1452 220 lb (99.8 kg)     Height 01/12/18 1452 5\' 6"  (1.676 m)     Head Circumference --      Peak Flow --      Pain Score 01/12/18 1452 7     Pain Loc --      Pain Edu? --      Excl. in [place]? --    No data found.  Updated Vital Signs BP (!) 148/74 (BP Location: Left Arm)   Pulse 73   Temp 98.4 F (36.9 C) (Oral)   Resp 16   Ht 5\' 6"  (1.676 m)   Wt 220 lb (99.8 kg)   SpO2 97%   BMI 35.51 kg/m   Visual Acuity Right Eye Distance:   Left Eye Distance:   Bilateral Distance:    Right Eye Near:   Left Eye Near:    Bilateral Near:     Physical Exam  Constitutional: She is oriented to person, place, and time. She appears well-developed and well-nourished.  Non-toxic appearance. She does not appear ill. No distress.  HENT:  Head: Normocephalic.  Eyes: Pupils are equal,  round, and reactive to light.  Pulmonary/Chest: Effort normal and breath sounds normal.  Abdominal: Bowel sounds are normal. There is tenderness in the suprapubic area. There is no rigidity, no rebound,  no guarding and no CVA tenderness.  Neurological: She is alert and oriented to person, place, and time.  Skin: Skin is warm and dry.  Psychiatric: She has a normal mood and affect. Her behavior is normal.  Nursing note and vitals reviewed.    UC Treatments / Results  Labs (all labs ordered are listed, but only abnormal results are displayed) Labs Reviewed  URINALYSIS, COMPLETE (UACMP) WITH MICROSCOPIC - Abnormal; Notable for the following components:      Result Value   Hgb urine dipstick MODERATE (*)    Leukocytes, UA [place]an (*)    Bacteria, UA RARE (*)    All other components within normal limits  URINE CULTURE    EKG None  Radiology No results found.  Procedures Procedures (including critical care time)  Medications Ordered in UC Medications - No data to display  Initial Impression / Assessment and Plan / UC Course  I have reviewed the triage vital signs and the nursing notes.  Pertinent labs & imaging results that were available during my care of the patient were reviewed by me and considered in my medical decision making (see chart for details).     Plan: 1. Test/x-ray results and diagnosis reviewed with patient 2. rx as per orders; risks, benefits, potential side effects reviewed with patient 3. Recommend supportive treatment with fluids.  Cultures will be available in 48 hours.  Use Zofran for nausea.  If you have any vomiting, fever, worsening of symptoms go immediately to the emergency room. 4. F/u prn if symptoms worsen or don't improve  Final Clinical Impressions(s) / UC Diagnoses   Final diagnoses:  Lower urinary tract infectious disease     Discharge Instructions     Drink plenty of fluids.  If you have nausea and vomiting fever worsening symptoms  go immediately to the emergency room.   ED Prescriptions    Medication Sig Dispense Auth. Provider   cephALEXin (KEFLEX) 500 MG capsule Take 1 capsule (500 mg total) by mouth 2 (two) times daily. 10 capsule Crecencio Mc P, PA-C   phenazopyridine (PYRIDIUM) 200 MG tablet Take 1 tablet (200 mg total) by mouth 3 (three) times daily. 6 tablet Crecencio Mc P, PA-C   ondansetron (ZOFRAN ODT) 8 MG disintegrating tablet Take 1 tablet (8 mg total) by mouth 2 (two) times daily. 6 tablet Lorin Picket, PA-C     Controlled Substance Prescriptions [place] Controlled Substance Registry consulted? Not Applicable   Lorin Picket, PA-C 01/12/18 5885

## 2018-01-15 LAB — URINE CULTURE

## 2018-01-23 DIAGNOSIS — R69 Illness, unspecified: Secondary | ICD-10-CM | POA: Diagnosis not present

## 2018-02-17 ENCOUNTER — Ambulatory Visit: Payer: Medicare HMO | Admitting: Internal Medicine

## 2018-02-20 DIAGNOSIS — R69 Illness, unspecified: Secondary | ICD-10-CM | POA: Diagnosis not present

## 2018-03-01 ENCOUNTER — Other Ambulatory Visit: Payer: Self-pay | Admitting: Family Medicine

## 2018-03-01 DIAGNOSIS — Z0000 Encounter for general adult medical examination without abnormal findings: Secondary | ICD-10-CM | POA: Diagnosis not present

## 2018-03-01 DIAGNOSIS — R002 Palpitations: Secondary | ICD-10-CM | POA: Diagnosis not present

## 2018-03-01 DIAGNOSIS — E2839 Other primary ovarian failure: Secondary | ICD-10-CM | POA: Diagnosis not present

## 2018-03-01 DIAGNOSIS — Z8669 Personal history of other diseases of the nervous system and sense organs: Secondary | ICD-10-CM | POA: Diagnosis not present

## 2018-03-01 DIAGNOSIS — Z1231 Encounter for screening mammogram for malignant neoplasm of breast: Secondary | ICD-10-CM | POA: Diagnosis not present

## 2018-03-01 DIAGNOSIS — Z23 Encounter for immunization: Secondary | ICD-10-CM | POA: Diagnosis not present

## 2018-03-01 DIAGNOSIS — E7800 Pure hypercholesterolemia, unspecified: Secondary | ICD-10-CM | POA: Diagnosis not present

## 2018-03-01 DIAGNOSIS — E669 Obesity, unspecified: Secondary | ICD-10-CM | POA: Diagnosis not present

## 2018-03-01 DIAGNOSIS — Z131 Encounter for screening for diabetes mellitus: Secondary | ICD-10-CM | POA: Diagnosis not present

## 2018-03-20 DIAGNOSIS — R69 Illness, unspecified: Secondary | ICD-10-CM | POA: Diagnosis not present

## 2018-04-05 ENCOUNTER — Ambulatory Visit
Admission: RE | Admit: 2018-04-05 | Discharge: 2018-04-05 | Disposition: A | Discharge status: Home / Self Care | Payer: Medicare HMO | Source: Ambulatory Visit | Attending: Family Medicine | Admitting: Family Medicine

## 2018-04-05 DIAGNOSIS — M85852 Other specified disorders of bone density and structure, left thigh: Secondary | ICD-10-CM | POA: Diagnosis not present

## 2018-04-05 DIAGNOSIS — E2839 Other primary ovarian failure: Secondary | ICD-10-CM

## 2018-04-05 DIAGNOSIS — Z1231 Encounter for screening mammogram for malignant neoplasm of breast: Secondary | ICD-10-CM

## 2018-04-05 DIAGNOSIS — Z780 Asymptomatic menopausal state: Secondary | ICD-10-CM | POA: Diagnosis not present

## 2019-03-12 ENCOUNTER — Other Ambulatory Visit: Payer: Self-pay | Admitting: Family Medicine

## 2019-03-12 DIAGNOSIS — Z1231 Encounter for screening mammogram for malignant neoplasm of breast: Secondary | ICD-10-CM

## 2019-03-28 NOTE — Progress Notes (Signed)
Jasmine Malone was seen today in the movement disorders clinic for neurologic consultation at the request of Wardell Honour, MD.  The consultation is for the evaluation of LUE tremor per records but pt states both hands..  The records that were made available to me were reviewed..  Tremor: Yes.     How long has it been going on? Few years  At rest or with activation?  activation  When is it noted the most?  When handing something  Fam hx of tremor?  Yes.  , sister with PD (husband with ET, s/p DBS)  Located where?  Both hands  Affected by caffeine:  Doesn't drink caffeine  Affected by alcohol:  Doesn't drink enough to know (drinks rarely - every few months)  Affected by stress:  Yes.    Affected by fatigue:  Yes.    Spills soup if on spoon:  No.  Spills glass of liquid if full:  No.  Affects ADL's (tying shoes, brushing teeth, etc):  No.  Tremor inducing meds:  No.  Other Specific Symptoms:  Voice: no change Sleep: sleeping well with exception of stress (misses seeing grandkids with covid)  Vivid Dreams:  Yes.    Acting out dreams:  Yes.  , occasionally Wet Pillows: Yes.   Postural symptoms:  Yes.    Falls?  Yes.  , grabbed ahold of a chair on wheels and it moved on her and she lost balance and fell.   She fell 3 years ago in daughters house at the stairs. Bradykinesia symptoms: no bradykinesia noted Loss of smell:  Yes.   Loss of taste:  No. Urinary Incontinence:  Yes.  , stress; wears pads Difficulty Swallowing:  No. Handwriting, micrographia: No. Depression:  Yes.   with pandemic and not being able to see family Memory changes:  Yes.  , names.  Able to do monthly finances but does note that she may get late notice N/V:  No. Lightheaded:  Yes.  , rare  Syncope: No. Diplopia:  Yes.  , since retina surgery Dyskinesia:  No.  Neuroimaging of the brain has previously been performed.  It is not available for my review today.  Was done over 10 years ago at [place]    ALLERGIES:   Allergies  Allergen Reactions  . Codeine Nausea Only    Can take Percocet with Zofran  . Ibuprofen Other (See Comments)    Irritates stomach due to hiatal hernia   . Shellfish Allergy Nausea Only    CURRENT MEDICATIONS:  Current Outpatient Medications  Medication Instructions  . albuterol (PROVENTIL HFA;VENTOLIN HFA) 108 (90 Base) MCG/ACT inhaler 2 puffs, Inhalation, Every 6 hours PRN  . FENUGREEK PO 1 capsule, Daily PRN  . Glucos-MSM-C-Mn-Ginger-Willow (MSM GLUCOSAMINE COMPLEX PO) 1 capsule, Daily  . Multiple Vitamin (MULTIVITAMIN WITH MINERALS) TABS tablet 1 tablet, Daily  . Omega-3 Fatty Acids (FISH OIL PO) 2 capsules, Daily  . ondansetron (ZOFRAN ODT) 8 mg, Oral, 2 times daily  . pantoprazole (PROTONIX) 40 mg, Oral, Daily    PAST MEDICAL HISTORY:   Past Medical History:  Diagnosis Date  . Anxiety    parental strain; previous medicaiton; ;followed by therapist once per month; Nan Hyball in [facility].  Marland Kitchen Arthritis   . Asthma    Albuterol use prior to exercise.  No hospitalizations or ED visits.  . Cancer ([place])    squamous cell cancer  . Cataracts, bilateral 05/10/2012   s/p B cataract resection.  . Depression   .  GERD (gastroesophageal reflux disease)   . H. pylori infection   . Heart murmur   . Hepatitis    Type A as a child  . Hiatal hernia   . Hyperlipidemia   . Parathyroid abnormality ([place])   . PONV (postoperative nausea and vomiting)     PAST SURGICAL HISTORY:   Past Surgical History:  Procedure Laterality Date  . CATARACT EXTRACTION, BILATERAL    . DILATATION & CURETTAGE/HYSTEROSCOPY WITH TRUECLEAR N/A 02/20/2014   Procedure: DILATATION & CURETTAGE/HYSTEROSCOPY WITH TRUCLEAR;  Surgeon: Margarette Asal, MD;  Location: [facility];  Service: Gynecology;  Laterality: N/A;  . [place]  . PARATHYROIDECTOMY  1997   3 resected.    Marland Kitchen RETINAL DETACHMENT SURGERY Right 2012  . RETINAL DETACHMENT SURGERY Left    x 4  . TUBAL LIGATION   1993    SOCIAL HISTORY:   Social History   Socioeconomic History  . Marital status: Married    Spouse name: Not on file  . Number of children: Not on file  . Years of education: Not on file  . Highest education level: Not on file  Occupational History  . Not on file  Social Needs  . Financial resource strain: Not on file  . Food insecurity    Worry: Not on file    Inability: Not on file  . Transportation needs    Medical: Not on file    Non-medical: Not on file  Tobacco Use  . Smoking status: Never Smoker  . Smokeless tobacco: Never Used  Substance and Sexual Activity  . Alcohol use: Yes    Comment: occasional glass of wine  . Drug use: No  . Sexual activity: Yes    Birth control/protection: Post-menopausal  Lifestyle  . Physical activity    Days per week: Not on file    Minutes per session: Not on file  . Stress: Not on file  Relationships  . Social Herbalist on phone: Not on file    Gets together: Not on file    Attends religious service: Not on file    Active member of club or organization: Not on file    Attends meetings of clubs or organizations: Not on file    Relationship status: Not on file  . Intimate partner violence    Fear of current or ex partner: Not on file    Emotionally abused: Not on file    Physically abused: Not on file    Forced sexual activity: Not on file  Other Topics Concern  . Not on file  Social History Narrative   Marital status: married x 36 years; happily married; no abuse.       Children:  4 children (2 girls 2 boys 108, 63, 35, 42); 6 grandchildren.      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      Lives: with husband; daughter moving out.      Tobacco:  None      Alcohol: wine weekends; mixed drinks at restaurant.     Exercise: [employer] two days per week. Weights.       Seatbelt:  100%; no texting     ADLs: driving; performs all ADLs; no assistant devices.      Pets: 2 dogs  and 1 cat.      Advanced Directives: none; bought a will kit to complete.  FULL CODE but no prolonged measures.  FAMILY HISTORY:   Family Status  Relation Name Status  . Mother  Deceased at age 40  . Father  Deceased at age 14       brain malignancy. In [place] war and [place] war.  . Brother  Alive  . Sister  Alive  . Brother  Alive  . Sister  Alive  . Brother  Alive  . Sister  Deceased  . Child 4 Alive  . Neg Hx  (Not Specified)    ROS:  Review of Systems  Constitutional: Negative.   HENT: Negative.   Eyes: Negative.   Respiratory: Negative.   Cardiovascular: Negative.   Gastrointestinal: Positive for constipation and heartburn.  Genitourinary: Positive for frequency and urgency.  Musculoskeletal: Negative.   Skin: Negative.   Endo/Heme/Allergies: Negative.   Psychiatric/Behavioral: Positive for memory loss.    PHYSICAL EXAMINATION:    VITALS:   Vitals:   03/30/19 0822  BP: 110/70  Pulse: 71  Resp: 16  SpO2: 97%  Weight: 238 lb 4.8 oz (108.1 kg)  Height: '5\' 5"'$  (1.651 m)    GEN:  The patient appears stated age and is in NAD. HEENT:  Normocephalic, atraumatic.  The mucous membranes are moist. The superficial temporal arteries are without ropiness or tenderness. CV:  RRR Lungs:  CTAB Neck/HEME:  There are no carotid bruits bilaterally.  Neurological examination:  Orientation: The patient is alert and oriented x3. Fund of knowledge is appropriate.  Recent and remote memory are intact.  Attention and concentration are normal.    Able to name objects and repeat phrases. Cranial nerves: There is good facial symmetry.  Extraocular muscles are intact with the exception of L strabismus (chronic after L retina sx). The visual fields are full to confrontational testing. The speech is fluent and clear. Soft palate rises symmetrically and there is no tongue deviation. Hearing is intact to conversational tone. Sensation: Sensation is intact to light and pinprick  throughout (facial, trunk, extremities). Vibration is intact at the bilateral big toe. There is no extinction with double simultaneous stimulation. There is no sensory dermatomal level identified. Motor: Strength is 5/5 in the bilateral upper and lower extremities.   Shoulder shrug is equal and symmetric.  There is no pronator drift. Deep tendon reflexes: Deep tendon reflexes are 2+/4 at the bilateral biceps, triceps, brachioradialis, patella and achilles. Plantar responses are downgoing bilaterally.  Movement examination: Tone: There is normal tone in the bilateral upper extremities.  The tone in the lower extremities is normal.  Arms held in a bradykinetic fashion but were not stiff on movement. Abnormal movements: there is no rest tremor.  No intention tremor.  she has no difficulty with archimedes spirals.  she has no difficulty when asked to pour a full glass of water from one glass to another. Coordination:  There is no decremation with RAM's, with any form of RAMS, including alternating supination and pronation of the forearm, hand opening and closing, finger taps, heel taps and toe taps. Gait and Station: The patient has no difficulty arising out of a deep-seated chair without the use of the hands. The patient's stride length is good.    Labs: Patient had lab work on March 09, 2019.  White blood cells were 8.6, hemoglobin 13.8, hematocrit 40.6 and platelets 190.  Sodium was 140, potassium 4.0, chloride 107, CO2 24, BUN 13, creatinine 0.9, glucose 87, AST 30, ALT 22, alkaline phosphatase 78.  Last TSH was done on March 01, 2018 and was 2.70.  ASSESSMENT/PLAN:  1.  Tremor, likely essential  -didn't see any tremor today but we will keep an eye on it.  She was a tad bradykinetic with fam hx of PD but otherwise she was normal and met no criteria for PD.  Reassurance provided.  2.  Memory change  -suspect that she has MCI and likely pseudodementia from depression.  Discussed neurocog  testing.  We will get that scheduled.  We will order b12 and folate  -counseling may be of value.  Pt very stressed by pandemic, inability to see grandchildren and politics.  Will leave to PCP discretion but it is something to think about.  3.  F/u 1 year, sooner should new issues arise.  Much greater than 50% of this visit was spent in counseling and coordinating care.  Total face to face time:  45 min  Cc:  Wardell Honour, MD

## 2019-03-30 ENCOUNTER — Other Ambulatory Visit (INDEPENDENT_AMBULATORY_CARE_PROVIDER_SITE_OTHER): Payer: Medicare HMO

## 2019-03-30 ENCOUNTER — Ambulatory Visit (INDEPENDENT_AMBULATORY_CARE_PROVIDER_SITE_OTHER): Payer: Medicare HMO | Admitting: Neurology

## 2019-03-30 ENCOUNTER — Encounter: Payer: Self-pay | Admitting: Neurology

## 2019-03-30 ENCOUNTER — Other Ambulatory Visit: Payer: Self-pay

## 2019-03-30 VITALS — BP 110/70 | HR 71 | Resp 16 | Ht 65.0 in | Wt 238.3 lb

## 2019-03-30 DIAGNOSIS — R413 Other amnesia: Secondary | ICD-10-CM | POA: Diagnosis not present

## 2019-03-30 DIAGNOSIS — R6889 Other general symptoms and signs: Secondary | ICD-10-CM | POA: Diagnosis not present

## 2019-03-30 DIAGNOSIS — Z5181 Encounter for therapeutic drug level monitoring: Secondary | ICD-10-CM | POA: Diagnosis not present

## 2019-03-30 DIAGNOSIS — R251 Tremor, unspecified: Secondary | ICD-10-CM

## 2019-03-30 DIAGNOSIS — R5383 Other fatigue: Secondary | ICD-10-CM

## 2019-03-30 NOTE — Patient Instructions (Addendum)
You have been referred for a neurocognitive evaluation in our office.   The evaluation has two parts.   . The first part of the evaluation is a clinical interview with the neuropsychologist (Dr. Melvyn Novas or Dr. Nicole Kindred). Please bring someone with you to this appointment if possible, as it is helpful for the doctor to hear from both you and another adult who knows you well.   . The second part of the evaluation is testing with the doctor's technician Hinton Dyer or Maudie Mercury). The testing includes a variety of tasks- mostly question-and-answer, some paper-and-pencil. There is nothing you need to do to prepare for this appointment, but having a good night's sleep prior to the testing, taking medications as you normally would, and bringing eyeglasses and hearing aids (if you wear them), is advised. Please make sure that you wear a mask to the appointment.  Please note: We have to reserve several hours of the neuropsychologist's time and the psychometrician's time for your evaluation appointment. As such, please note that there is a No-Show fee of $100. If you are unable to attend any of your appointments, please contact our office as soon as possible to reschedule.  Your provider has requested that you have labwork completed today. Please go to [facility] ([id number]) on the second floor of this building before leaving the office today. You do not need to check in. If you are not called within 15 minutes please check with the front desk.

## 2019-03-31 LAB — B12 AND FOLATE PANEL
Folate: >24 ng/mL
Vitamin B-12: 595 pg/mL (ref 200–1100)

## 2019-04-11 ENCOUNTER — Telehealth: Payer: Self-pay | Admitting: Neurology

## 2019-04-11 NOTE — Telephone Encounter (Signed)
Patient came in office with her husband today. She said that she saw Dr. Carles Collet last week and that her problem is worse. Please Call. Thank you

## 2019-04-11 NOTE — Telephone Encounter (Signed)
Patient scheduled a follow up virtual visit on 04/26/19 with Dr. Carles Collet. She said there is no need to call her since she has an appointment.

## 2019-04-25 ENCOUNTER — Encounter: Payer: Self-pay | Admitting: Neurology

## 2019-04-25 NOTE — Progress Notes (Signed)
Virtual Visit via Video Note The purpose of this virtual visit is to provide medical care while limiting exposure to the novel coronavirus.    Consent was obtained for video visit:  Yes.   Answered questions that patient had about telehealth interaction:  Yes.   I discussed the limitations, risks, security and privacy concerns of performing an evaluation and management service by telemedicine. I also discussed with the patient that there may be a patient responsible charge related to this service. The patient expressed understanding and agreed to proceed.  Pt location: Home Physician Location: office Name of referring provider:  Ethelda Chick, MD I connected with Lisabeth Pick Pangallo at patients initiation/request on 04/26/2019 at  8:15 AM EST by video enabled telemedicine application and verified that I am speaking with the correct person using two identifiers. Pt MRN:  [id number] Pt DOB:  [birth year] Video Participants:  Lisabeth Pick Horney;     History of Present Illness:  Patient seen today in follow-up for memory change and tremor.  She is seen earlier than expected.  I saw her husband fairly recently, and he mentioned that she needed to be worked in.  Last visit, we discussed both of these things.  I suspected that she had mild cognitive impairment and pseudodementia from underlying depression, associated with isolation of the pandemic and political issues.  She is scheduled for neurocognitive testing with Dr. Milbert Coulter on January 15.  Patient's husband also mentioned that her tremor was much worse and she was having trouble eating.  When I saw her on November 20, I did not note significant tremor on examination.  Today, she states that she has had a little more caffeine than normal and notes that sugar and chocolate will cause/add to tremor.  It is mostly with use of the hand and she notes it in both hands, but R more noticeable b/c she is R handed.  She is noting "weakness" in the wrist on the right - " I  am constantly dropping things."  She is noting some trouble opening jars but not terribly difficult.  No paresthesias in the hand.  No arm weakness or leg weakness.  States that her dad had "brain tumors and was exposed to agent orange."     Current Outpatient Medications on File Prior to Visit  Medication Sig Dispense Refill  . albuterol (PROVENTIL HFA;VENTOLIN HFA) 108 (90 Base) MCG/ACT inhaler Inhale 2 puffs into the lungs every 6 (six) hours as needed for wheezing or shortness of breath (cough, shortness of breath or wheezing.). 1 Inhaler 1  . FENUGREEK PO Take 1 capsule by mouth daily as needed (for congestion).    Stephanie Acre (MSM GLUCOSAMINE COMPLEX PO) Take 1 capsule by mouth daily.     . Multiple Vitamin (MULTIVITAMIN WITH MINERALS) TABS tablet Take 1 tablet by mouth daily.    . Omega-3 Fatty Acids (FISH OIL PO) Take 2 capsules by mouth daily.     . ondansetron (ZOFRAN ODT) 8 MG disintegrating tablet Take 1 tablet (8 mg total) by mouth 2 (two) times daily. 6 tablet 0  . pantoprazole (PROTONIX) 40 MG tablet TAKE 1 TABLET (40 MG TOTAL) BY MOUTH DAILY. 30 tablet 3   Current Facility-Administered Medications on File Prior to Visit  Medication Dose Route Frequency Provider Last Rate Last Admin  . 0.9 %  sodium chloride infusion  500 mL Intravenous Continuous Pyrtle, Carie Caddy, MD         Observations/Objective:   Vitals:  04/26/19 0810  Weight: 230 lb (104.3 kg)  Height: 5\' 5"  (1.651 m)   GEN:  The patient appears stated age and is in NAD.  Neurological examination:  Orientation: The patient is alert and oriented x3. Cranial nerves: There is good facial symmetry. There is no facial hypomimia.  The speech is fluent and clear. Soft palate rises symmetrically and there is no tongue deviation. Hearing is intact to conversational tone. Motor: Strength is at least antigravity x 4.   Shoulder shrug is equal and symmetric.  There is no pronator drift.  Movement  examination: Tone: unable Abnormal movements: No rest tremor is seen, but I did not really see her hands very well at rest.  She does have mild tremor of the fingers on the right of the outstretched hands. Coordination:  There is no decremation with RAM's Gait and Station: Not tested today    Chemistry      Component Value Date/Time   NA 143 11/26/2015 0822   K 4.0 11/26/2015 0822   CL 107 11/26/2015 0822   CO2 21 11/26/2015 0822   BUN 12 11/26/2015 0822   CREATININE 0.76 11/26/2015 0822      Component Value Date/Time   CALCIUM 9.0 11/26/2015 0822   ALKPHOS 74 11/26/2015 0822   AST 18 11/26/2015 0822   ALT 16 11/26/2015 0822   BILITOT 0.8 11/26/2015 0822         Assessment and Plan:   1.  Memory loss  -Continue to expect that this is likely pseudodementia from underlying anxiety/depression.  She is already scheduled for neurocognitive testing with Dr. Milbert Coulter on January 15.  Discussed with her that this will certainly help elucidate the etiology of the symptoms, allowing for more accurate treatment.  Nothing further to be done today until we get the results of that.  Follow-up will be guided by his results.  2.  Tremor  -Did not see a significant degree of tremor in my in person visit less than a month ago, but has become bothersome to the patient.  Could try use of low-dose beta-blocker.  Her asthma is only exercise-induced.  Looked through her flowsheet of vital signs and think that her blood pressure/pulse could support it.  I think that propranolol may be a good choice, because it can also be used as needed.  Decided to try very low dose, 10 mg 1-2 times per day.  Patient was agreeable.  R/B/SE were discussed.  The opportunity to ask questions was given and they were answered to the best of my ability.  The patient expressed understanding and willingness to follow the outlined treatment protocols.  3.  Wrist weakness per pt  -Hard to decipher on video visit exactly what that  meant.  I told her this could be arthritic in nature.  Could be a feature of carpal tunnel, although it did not really sound like carpal tunnel.  Offered EMG.  She declined for now.  She will let me know if she changes her mind, or if she has other new signs or symptoms.  Follow Up Instructions:  has appt with Dr. Milbert Coulter in Jan and appt with me next Nov.  Will move her appt with me to spring/summer.  -I discussed the assessment and treatment plan with the patient. The patient was provided an opportunity to ask questions and all were answered. The patient agreed with the plan and demonstrated an understanding of the instructions.   The patient was advised to call back or  seek an in-person evaluation if the symptoms worsen or if the condition fails to improve as anticipated.    Total Time spent in visit with the patient was:  15 min, of which more than 50% of the time was spent in counseling.   Pt understands and agrees with the plan of care outlined.     Kerin Salen, DO

## 2019-04-26 ENCOUNTER — Telehealth (INDEPENDENT_AMBULATORY_CARE_PROVIDER_SITE_OTHER): Payer: Medicare HMO | Admitting: Neurology

## 2019-04-26 ENCOUNTER — Other Ambulatory Visit: Payer: Self-pay

## 2019-04-26 ENCOUNTER — Encounter: Payer: Self-pay | Admitting: Neurology

## 2019-04-26 VITALS — Ht 65.0 in | Wt 230.0 lb

## 2019-04-26 DIAGNOSIS — R413 Other amnesia: Secondary | ICD-10-CM

## 2019-04-26 DIAGNOSIS — R251 Tremor, unspecified: Secondary | ICD-10-CM | POA: Diagnosis not present

## 2019-04-26 MED ORDER — PROPRANOLOL HCL 10 MG PO TABS: 10.0000 mg | ORAL_TABLET | Freq: Two times a day (BID) | ORAL | 2 refills | Status: DC

## 2019-05-25 ENCOUNTER — Encounter: Payer: Self-pay | Admitting: Psychology

## 2019-05-25 ENCOUNTER — Ambulatory Visit (INDEPENDENT_AMBULATORY_CARE_PROVIDER_SITE_OTHER): Payer: Medicare HMO | Admitting: Psychology

## 2019-05-25 ENCOUNTER — Other Ambulatory Visit: Payer: Self-pay

## 2019-05-25 ENCOUNTER — Ambulatory Visit: Payer: Medicare HMO

## 2019-05-25 DIAGNOSIS — R251 Tremor, unspecified: Secondary | ICD-10-CM | POA: Diagnosis not present

## 2019-05-25 DIAGNOSIS — F330 Major depressive disorder, recurrent, mild: Secondary | ICD-10-CM

## 2019-05-25 DIAGNOSIS — F4310 Post-traumatic stress disorder, unspecified: Secondary | ICD-10-CM | POA: Diagnosis not present

## 2019-05-25 DIAGNOSIS — F331 Major depressive disorder, recurrent, moderate: Secondary | ICD-10-CM

## 2019-05-25 DIAGNOSIS — F411 Generalized anxiety disorder: Secondary | ICD-10-CM | POA: Diagnosis not present

## 2019-05-25 DIAGNOSIS — F329 Major depressive disorder, single episode, unspecified: Secondary | ICD-10-CM | POA: Insufficient documentation

## 2019-05-25 DIAGNOSIS — F32A Depression, unspecified: Secondary | ICD-10-CM | POA: Insufficient documentation

## 2019-05-25 NOTE — Progress Notes (Signed)
   Psychometrician Note   Vedanshi L Marconi completed 140 minutes of neuropsychological testing with technician, Cruzita Lederer, B.S., under the supervision of Dr. Christia Reading, Ph.D., licensed psychologist. The patient did not appear overtly distressed by the testing session, per behavioral observation or via self-report to the technician. Rest breaks were offered.    In considering the patient's current level of functioning, level of presumed impairment, nature of symptoms, emotional and behavioral responses during the interview, level of literacy, and observed level of motivation/effort, a battery of tests was selected and communicated to the psychometrician.   Communication between the psychologist and technician was ongoing throughout the testing session and changes were made as deemed necessary based on patient performance on testing, technician observations and additional pertinent factors such as those listed above.   Ronnisha L Naramore will return within approximately two weeks for an interactive feedback session with Dr. Melvyn Novas at which time his test performances, clinical impressions, and treatment recommendations will be reviewed in detail. The patient understands she can contact our office should she require our assistance before this time.  140 minutes were spent face-to-face with patient administering standardized tests, 30 minutes were spent scoring by the technician, and another 15 minutes were spent reviewing scored testing protocols by Dr. Melvyn Novas. [CPT [id number], [id number]  This note reflects time spent with the psychometrician and does not include test scores or any clinical interpretations made by Dr. Melvyn Novas. The full report will follow in a separate note.

## 2019-05-25 NOTE — Progress Notes (Signed)
NEUROPSYCHOLOGICAL EVALUATION [name]  Reason for Referral:   Jasmine Malone is a 74 y.o. Caucasian female referred by Alonza Bogus, D.O., to characterize her current cognitive functioning and assist with diagnostic clarity and treatment planning in the context of subjective cognitive decline, a history of bilateral upper extremity tremors, and psychiatric comorbidities.  Assessment and Plan:   Clinical Impression(s): Jasmine Malone pattern of performance is suggestive of neuropsychological functioning largely within normal limits. Subtle to mild frontal-subcortical dysfunction was exhibited, evidenced by relative weaknesses and performance variability across domains of processing speed, cognitive flexibility, and encoding/retrieval aspects of memory. Variability was also exhibited across visuospatial functioning; however, this may be influenced by residual vision loss given bilateral retinal surgeries. Performance was within normal limits across attention/concentration, other aspects of executive functioning, receptive and expressive language, and consolidation aspects of memory. It is possible that scores in the below average range represent a decline from a previously higher level of functioning. However, there is no testing available for comparison purposes. These scores could also reflect normal intraindividual variability across a battery of tests, the influence of psychiatric factors (see below), and/or longstanding strengths and weaknesses.  Frontal-subcortical dysfunction can be seen in Parkinson's disease, well as other medical conditions. Notably, Dr. Carles Collet did not note a strong likelihood of Parkinson's disease being present when Jasmine Malone was last evaluated and testing scores alone certainly cannot diagnose this condition. Given the minimal concern surrounding Parkinson's disease from a neurological standpoint, the most likely etiology  for weaknesses appears to be the combination of mild symptoms of anxiety and depression, sub-threshold but still present symptoms associated with PTSD, and mild sleep dysfunction. These conditions commonly influence cognitive functioning in the domains which Jasmine Malone exhibited her most prominent weaknesses and/or variability. Continued medical monitoring moving forward will be important. Test scores are not concerning for Alzheimer's disease presently.   Recommendations: A repeat neuropsychological evaluation in 24 months (or sooner if functional decline is noted) is recommended to assess the trajectory of future cognitive decline should it occur. This will also aid in future efforts towards improved diagnostic clarity.  A combination of medication and psychotherapy has been shown to be most effective at treating symptoms of anxiety and depression. As such, Jasmine Malone is encouraged to speak with her prescribing physician regarding medication adjustments to optimally manage these symptoms.   Regarding psychotherapy, Jasmine Malone noted meeting with her therapist once per month. She could consider increasing the frequency of their sessions to every other week on a short-term basis. She would benefit from an active and collaborative therapeutic environment, rather than one purely supportive in nature. Recommended treatment modalities include Cognitive Behavioral Therapy (CBT) or Acceptance and Commitment Therapy (ACT).  Jasmine Malone is encouraged to attend to lifestyle factors for brain health (e.g., regular physical exercise, good nutrition habits, regular participation in cognitively-stimulating activities, and general stress management techniques), which are likely to have benefits for both emotional adjustment and cognition. In fact, in addition to promoting good general health, regular exercise incorporating aerobic activities (e.g., brisk walking, jogging, cycling, etc.) has been demonstrated to be a very  effective treatment for depression and stress, with similar efficacy rates to both antidepressant medication and psychotherapy.  If interested, there are some activities which have therapeutic value and can be useful in keeping her cognitively stimulated. For suggestions, Jasmine Malone is encouraged to go to the following website: https://www.[facility].org/get-to-know-barrow/centers-programs/neurorehabilitation-center/neuro-rehab-apps-and-games/ which has options, categorized by level of difficulty. It should  be noted that these activities should not be viewed as a substitute for therapy.  When learning new information, she would benefit from information being broken up into [place], manageable pieces. She may also find it helpful to articulate the material in her own words and in a context to promote encoding at the onset of a new task. This material may need to be repeated multiple times to promote encoding.  To address problems with processing speed, she may wish to consider:   -Scheduling more difficult activities for a time of day when she is usually most alert   -Ensuring that she is alerted when essential material or instructions are being presented   -Adjusting the speed at which new information is presented   -Allowing additional processing time or a chance to rehearse novel information   -Allowing for more time in comprehending, processing, and responding in conversation  Review of Records:   Jasmine Malone was seen by [facility] Wells Guiles Tat, D.O.) on 04/26/2019 for memory change and tremor. At that time, Jasmine Malone's husband mentioned that her tremor was much worse and she was having trouble eating. Significant tremors were not noted by Dr. Carles Collet during a previous appointment in November. Jasmine Malone reported that she had a little more caffeine than normal and noted that sugar and chocolate will cause/add to tremor. Tremors were said to occur in her upper extremities bilaterally (right is more  noticeable, likely due to her being right-handed). She also reported wrist "weakness" in her right hand causing her to frequently drop things. Dr. Carles Collet theorized that subjective cognitive difficulties were related to ongoing psychiatric distress. As such, Jasmine Malone was referred for a comprehensive neuropsychological evaluation to characterize her cognitive abilities and to assist with diagnostic clarity and treatment planning.  Neuroimaging was unavailable for review.  Past Medical History:  Diagnosis Date  . Arthritis   . Asthma    Albuterol use prior to exercise.  No hospitalizations or ED visits.  . Cancer    Squamous cell cancer  . Cataracts, bilateral 05/10/2012   s/p B cataract resection.  . Generalized anxiety disorder   . GERD (gastroesophageal reflux disease)   . H. pylori infection   . Heart murmur   . Hepatitis    Type A as a child  . Hiatal hernia   . Hyperlipidemia   . Major depressive disorder    Followed by therapist once per month; Nan Hyball in [facility].  . Parathyroid abnormality   . PONV (postoperative nausea and vomiting)   . Post traumatic stress disorder (PTSD)   . Tremor     Past Surgical History:  Procedure Laterality Date  . CATARACT EXTRACTION, BILATERAL    . DILATATION & CURETTAGE/HYSTEROSCOPY WITH TRUECLEAR N/A 02/20/2014   Procedure: DILATATION & CURETTAGE/HYSTEROSCOPY WITH TRUCLEAR;  Surgeon: Margarette Asal, MD;  Location: [facility];  Service: Gynecology;  Laterality: N/A;  . [place]  . PARATHYROIDECTOMY  1997   3 resected.    Marland Kitchen RETINAL DETACHMENT SURGERY Right 2012  . RETINAL DETACHMENT SURGERY Left    x 4  . TUBAL LIGATION  1993    Family History  Problem Relation Age of Onset  . Stroke Mother 50       CVA; tobacco abuse.  . Mental illness Mother   . Alcohol abuse Mother   . Diabetes Father   . Cancer Father        brain cancer/malignancy  . Mental illness Father   . Diabetes  Brother   . Kidney failure Brother   .  Diabetes Sister   . Depression Sister   . Cancer Sister   . Colon cancer Neg Hx      Current Outpatient Medications:  .  albuterol (PROVENTIL HFA;VENTOLIN HFA) 108 (90 Base) MCG/ACT inhaler, Inhale 2 puffs into the lungs every 6 (six) hours as needed for wheezing or shortness of breath (cough, shortness of breath or wheezing.)., Disp: 1 Inhaler, Rfl: 1 .  FENUGREEK PO, Take 1 capsule by mouth daily as needed (for congestion)., Disp: , Rfl:  .  Glucos-MSM-C-Mn-Ginger-Willow (MSM GLUCOSAMINE COMPLEX PO), Take 1 capsule by mouth daily. , Disp: , Rfl:  .  Multiple Vitamin (MULTIVITAMIN WITH MINERALS) TABS tablet, Take 1 tablet by mouth daily., Disp: , Rfl:  .  Omega-3 Fatty Acids (FISH OIL PO), Take 2 capsules by mouth daily. , Disp: , Rfl:  .  ondansetron (ZOFRAN ODT) 8 MG disintegrating tablet, Take 1 tablet (8 mg total) by mouth 2 (two) times daily., Disp: 6 tablet, Rfl: 0 .  pantoprazole (PROTONIX) 40 MG tablet, TAKE 1 TABLET (40 MG TOTAL) BY MOUTH DAILY., Disp: 30 tablet, Rfl: 3 .  propranolol (INDERAL) 10 MG tablet, Take 1 tablet (10 mg total) by mouth 2 (two) times daily., Disp: 180 tablet, Rfl: 2  Current Facility-Administered Medications:  .  0.9 %  sodium chloride infusion, 500 mL, Intravenous, Continuous, Pyrtle, Lajuan Lines, MD  Clinical Interview:   Cognitive Symptoms: Decreased short-term memory: Endorsed. Ms. Murty described her primary difficulty as remembering the names of familiar individuals (e.g., actors or actresses when conversing with others). She also noted instances where she will lose her train of thought or forget why she entered a room. These were said to be ongoing for the past couple of years and have exhibited a gradual decline. Decreased long-term memory: Denied. Decreased attention/concentration: Denied. Reduced processing speed: Endorsed. She additionally noted brief periods of "confusion" where she will feel mentally foggy. Difficulties with executive functions:  Endorsed. She described longstanding difficulties with indecisiveness, as well as some instances of impulsive shopping. Personality changes, difficulties with organization or complex planning, and using poor judgment were denied. Difficulties with emotion regulation: Denied. Difficulties with receptive language: Denied. Difficulties with word finding: Endorsed. She also noted trouble with pronunciation and spelling at times. Decreased visuoperceptual ability: Endorsed. However, these symptoms were largely attributed to her history of retina surgery in both eyes and residual vision loss in her left eye.   Difficulties completing ADLs: Denied.  Additional Medical History: History of traumatic brain injury/concussion: Endorsed. She reported a remote history of a potential concussive injury while she was a child. She noted riding in an ambulance, suggesting perhaps a brief hospitalization. No other head injuries were reported.  History of stroke: Denied. History of seizure activity: Denied. History of known exposure to toxins: Denied. Symptoms of chronic pain: Denied. Experience of frequent headaches/migraines: Denied. Frequent instances of dizziness/vertigo: Denied.  Sensory changes: Ms. Riddley has undergone retinal surgery on both eyes, with residual vision loss still present in her left eye. She also noted mild hearing loss. Changes/difficulties with her senses of taste and smell were denied.  Balance/coordination difficulties: Endorsed. She described her balance as "not great" and reported commonly feeling unsteady on her feet. She denied a history of falls. She reported previously engaging in weekly stretching classes which were beneficial; however, these were stopped due to the ongoing COVID-19 pandemic.  Other motor difficulties: Endorsed. Bilateral upper extremity tremors were reported, generally  when Jasmine Malone is performing various fine motor tasks (e.g., holding and operating her cell phone).  These were said to be worsened by a variety of factors, including stress, caffeine, and chocolate/sugar consumption.  Sleep History: Estimated hours obtained each night: 8 hours. She also reported napping once throughout the day, lately due to boredom. Difficulties falling asleep: Endorsed. Symptoms of anxiety and worry were said to occasionally prevent Jasmine Malone from falling asleep quickly.  Difficulties staying asleep: Endorsed. Ms. Lazarin noted that she will commonly wake up to use the restroom, as well as occasionally be awoken by her cat throughout the night.  Feels rested and refreshed upon awakening: Denied. She reported feeling "a little tired."  History of snoring: Endorsed. History of waking up gasping for air: Endorsed. Witnessed breath cessation while asleep: Denied.  History of vivid dreaming: Denied.  Excessive movement while asleep: Denied. Instances of acting out her dreams: Largely denied. However, she did report "screaming out" in her sleep at times, causing her husband to wake her up.  Psychiatric/Behavioral Health History: Depression: Endorsed. Ms. Ferns reported a longstanding history of depression, largely attributed to experiencing familial dysfunction and personal trauma during her childhood. She is not currently taking medications to address these symptoms as prior trials led to negative side effects (e.g., feeling like a zombie). She does see a therapist once per month and has done so for the past several years. She acknowledged occasional passive suicidal ideation. However, she denied ever developing a plan or intent to act on these thoughts. Her grandchildren serve as strong protective factors against acting on these thoughts. Anxiety: Endorsed. Like depression, symptoms of anxiety were longstanding in nature. She also reported being previously diagnosed with PTSD stemming from her history of personal trauma. Acutely, she described notable symptoms of anxiety stemming  from the ongoing COVID-19 pandemic and political strife throughout the country.  Mania: Denied. Visual/auditory hallucinations: Denied. Delusional thoughts: Denied.  Tobacco: Denied. Alcohol: She reported very rare alcohol consumption (generally a glass of wine). She denied a history of problematic alcohol abuse or dependence.  Recreational drugs: Denied. Caffeine: Denied.  Academic/Vocational History: Highest level of educational attainment: 14 years. She graduated from high school and earned an Associate's degree. She described herself as a strong (A) student in academic settings.  History of developmental delay: Denied. History of grade repetition: Endorsed. She reported having to repeat one grade in middle school (likely 6th). However, this was due to her mother keeping her home from school to perform other tasks rather due to poor academic performance.  Enrollment in special education courses: Denied. History of diagnosed specific learning disability: Denied. History of ADHD: Denied.  Employment: Retired. She previously worked as a Surveyor, mining.   Evaluation Results:   Behavioral Observations: Jasmine Malone was unaccompanied, arrived to her appointment on time, and was appropriately dressed and groomed. Observed gait and station were somewhat slowed, but overall appropriate. Frank balance instability was not observed. Gross motor functioning appeared intact upon informal observation and no abnormal movements (e.g., tremors) were noted during the interview or testing procedures. Her affect was generally relaxed and positive, but did range appropriately given the subject being discussed during the clinical interview or the task at hand during testing procedures. Spontaneous speech was fluent and word finding difficulties were not observed during the clinical interview or testing procedures. Sustained attention was appropriate throughout. Thought processes were coherent, organized,  and normal in content. Task engagement was adequate and she persisted when challenged. Overall, Jasmine Malone  was cooperative with the clinical interview and subsequent testing procedures.   Adequacy of Effort: The validity of neuropsychological testing is limited by the extent to which the individual being tested may be assumed to have exerted adequate effort during testing. Jasmine Malone expressed her intention to perform to the best of her abilities and exhibited adequate task engagement and persistence. Scores across stand-alone and embedded performance validity measures were within expectation. As such, the results of the current evaluation are believed to be a valid representation of Jasmine Malone's current cognitive functioning.  Test Results: Ms. Kindig was largely oriented at the time of the current evaluation. Points were lost for being one day off when stating the current date.  Intellectual abilities based upon educational and vocational attainment were estimated to be in the average range. Premorbid abilities were estimated to be within the above average range based upon a single-word reading test.   Processing speed was variable, ranging from the well below average to average normative ranges. Basic attention was average. More complex attention (e.g., working memory) was also average. Executive functioning was largely within normal limits. A weakness (well below average range) was exhibited across on visuomotor test assessing cognitive flexibility.  Assessed receptive language abilities were within normal limits. Likewise, Ms. Maron did not exhibit any difficulties comprehending task instructions and answered all questions asked of her appropriately. Assessed expressive language (e.g., verbal fluency and confrontation naming) was within normal limits.     Assessed visuospatial/visuoconstructional abilities were variable, ranging from the exceptionally low to average ranges.    Learning (i.e.,  encoding) of novel verbal and visual information was below average to average. Spontaneous delayed recall (i.e., retrieval) of previously learned information was variable, ranging from the exceptionally low to average ranges. Retention rates were 90% across a story learning test, 0% (amnestic) across a list learning test, and 86% across a shape learning test. Performance across recognition tasks was strong, suggesting evidence for information consolidation.   Results of emotional screening instruments suggested that recent symptoms of generalized anxiety were in the mild range, while symptoms of depression were also within the mild range. Results of a PTSD screening instrument suggested that current symptoms were below threshold for an active diagnosis. A screening instrument assessing recent sleep quality suggested the presence of mild sleep dysfunction. Responses across a questionnaire assessing tremor symptoms suggested a primary area of difficulty surrounding bodily discomfort.  Tables of Scores:   Note: This summary of test scores accompanies the interpretive report and should not be considered in isolation without reference to the appropriate sections in the text. Descriptors are based on appropriate normative data and may be adjusted based on clinical judgment. The terms "impaired" and "within normal limits (WNL)" are used when a more specific level of functioning cannot be determined.       Effort Testing:   DESCRIPTOR       ACS Word Choice: --- --- Within Expectation    *Based on 74 y/o norms     Dot Counting Test: --- --- Within Expectation  HVLT-R Recognition Discrimination Index: --- --- Within Expectation  BVMT-R Retention Percentage: --- --- Within Expectation       Orientation:      Raw Score Percentile   NAB Orientation, Form 1 28/29 --- ---       Intellectual Functioning:           Standard Score Percentile   Test of Premorbid Functioning: 110 75 Above Average       Memory:  Wechsler Memory Scale (WMS-IV):                       Raw Score (Scaled Score) Percentile     Logical Memory I 27/53 (8) 25 Average    Logical Memory II 19/39 (11) 63 Average    Logical Memory Recognition 19/23 51-75 Average       Hopkins Verbal Learning Test (HVLT-R), Form 1: Raw Score (T Score) Percentile     Total Trials 1-3 19/36 (41) 18 Below Average    Delayed Recall 0/12 (18) <1 Exceptionally Low    Recognition Discrimination Index 10 (47) 38 Average      True Positives 11 --- ---      False Positives 1 --- ---       Brief Visuospatial Memory Test (BVMT-R), Form 1: Raw Score (T Score) Percentile     Total Trials 1-3 16/36 (42) 21 Below Average    Delayed Recall 6/12 (42) 21 Below Average    Recognition Discrimination Index 5 >16 Within Normal Limits      Recognition Hits 5/6 >16 Within Normal Limits      False Positive Errors 0 >16 Within Normal Limits        Attention/Executive Function:          Trail Making Test (TMT): Raw Score (T Score) Percentile     Part A 58 secs.,  0 errors (35) 7 Well Below Average    Part B 193 secs.,  4 errors (33) 5 Well Below Average       Symbol Digit Modalities Test (SDMT): Raw Score (Z-Score) Percentile     Oral 27 (-1.91) 3 Well Below Average       NAB Attention Module, Form 1: T Score Percentile     Digits Forward 48 42 Average    Digits Backwards 54 66 Average       D-KEFS Color-Word Interference Test: Raw Score (Scaled Score) Percentile     Color Naming 35 secs. (9) 37 Average    Word Reading 24 secs. (11) 63 Average    Inhibition 65 secs. (11) 63 Average      Total Errors 1 error (12) 75 Above Average    Inhibition/Switching 91 secs. (8) 25 Average      Total Errors 5 errors (8) 25 Average       D-KEFS 20 Questions Test: Scaled Score Percentile     Total Weighted Achievement Score 11 63 Average    Initial Abstraction Score 10 50 Average       [place] Card Sorting Test [facility]): Raw Score Percentile      Categories (trials) 4 (64) >16 Within Normal Limits    Total Errors 14 86 Above Average    Perseverative Errors 5 >99 Exceptionally High    Non-Perseverative Errors 9 38 Average    Failure to Maintain Set 0 --- ---       Language:          Verbal Fluency Test: Raw Score (T Score) Percentile     Phonemic Fluency (FAS) 36 (45) 31 Average    Animal Fluency 18 (48) 42 Average       NAB Language Module, Form 2: T Score Percentile     Auditory Comprehension 58 79 Above Average    Naming 29/31 (48) 42 Average       Visuospatial/Visuoconstruction:      Raw Score Percentile   Clock Drawing: 9/10 --- Within Normal Limits  NAB Spatial Module, Form 2: T Score Percentile     Visual Discrimination 54 66 Average    Design Construction 25 1 Exceptionally Low        Scaled Score Percentile   WAIS-IV Matrix Reasoning: 7 16 Below Average  WAIS-IV Visual Puzzles: 7 16 Below Average       Mood and Personality:      Raw Score Percentile   Geriatric Depression Scale: 13 --- Mild  Geriatric Anxiety Scale: 16 --- Mild    Somatic 8 --- Mild    Cognitive 4 --- Mild    Affective 4 --- Mild  PTSD Checklist for DSM-5: 29 --- Below Threshold       Additional Questionnaires:      Raw Score Percentile   PROMIS Sleep Disturbance Questionnaire: 25 --- Mild       Parkinson's Disease Questionnaire-39: Raw Score Percentile     Mobility 6 15 Mildly Affected    Activities of Daily Living 2 8 Minimally Affected    Emotional Well-Being 5 21 Mildly Affected    Stigma 1 6 Minimally Affected    Social Support 0 0 Minimally Affected    Cognitions 4 25 Mildly Affected    Communication 2 17 Mildly Affected    Bodily Discomfort 6 50 Moderately Affected   Informed Consent and Coding/Compliance:   Ms. Rohs was provided with a verbal description of the nature and purpose of the present neuropsychological evaluation. Also reviewed were the foreseeable risks and/or discomforts and benefits of the procedure,  limits of confidentiality, and mandatory reporting requirements of this provider. The patient was given the opportunity to ask questions and receive answers about the evaluation. Oral consent to participate was provided by the patient.   This evaluation was conducted by Christia Reading, Ph.D., licensed clinical neuropsychologist. Ms. Brous completed a 30-minute clinical interview, billed as one unit [phone], and 185 minutes of cognitive testing and scoring, billed as one unit [phone] and five additional units [phone]. Psychometrist Cruzita Lederer, B.S., assisted Dr. Melvyn Novas with test administration and scoring procedures. As a separate and discrete service, Dr. Melvyn Novas spent a total of 180 minutes in interpretation and report writing billed as one unit [phone] and three units [id number].

## 2019-06-13 ENCOUNTER — Other Ambulatory Visit: Payer: Self-pay

## 2019-06-13 ENCOUNTER — Telehealth (INDEPENDENT_AMBULATORY_CARE_PROVIDER_SITE_OTHER): Payer: Medicare HMO | Admitting: Psychology

## 2019-06-13 ENCOUNTER — Encounter: Payer: Self-pay | Admitting: Psychology

## 2019-06-13 DIAGNOSIS — F411 Generalized anxiety disorder: Secondary | ICD-10-CM

## 2019-06-13 DIAGNOSIS — F330 Major depressive disorder, recurrent, mild: Secondary | ICD-10-CM

## 2019-06-13 NOTE — Progress Notes (Signed)
   Neuropsychology Feedback Session Tillie Rung. [place] Department of Neurology  Reason for Referral:   SANJA OSSMAN a 75 y.o. Caucasian female referred by Alonza Bogus, D.O.,to characterize hercurrent cognitive functioning and assist with diagnostic clarity and treatment planning in the context of subjective cognitive decline, a history of bilateral upper extremity tremors, and psychiatric comorbidities.  Feedback:   Ms. Preston completed a comprehensive neuropsychological evaluation on 05/25/2019. Please refer to that encounter for the full report and recommendations. Briefly, results suggested Ms. Postlewaite's pattern of performance is suggestive of neuropsychological functioning largely within normal limits. Subtle to mild frontal-subcortical dysfunction was exhibited, evidenced by relative weaknesses and performance variability across domains of processing speed, cognitive flexibility, and encoding/retrieval aspects of memory. Variability was also exhibited across visuospatial functioning; however, this may be influenced by residual vision loss given bilateral retinal surgeries. Frontal-subcortical dysfunction can be seen in Parkinson's disease, well as other medical conditions. Notably, Dr. Carles Collet did not note a strong likelihood of Parkinson's disease being present when Ms. Troup was last evaluated and testing scores alone certainly cannot diagnose this condition. Given the minimal concern surrounding Parkinson's disease from a neurological standpoint, the most likely etiology for weaknesses appears to be the combination of mild symptoms of anxiety and depression, sub-threshold but still present symptoms associated with PTSD, and mild sleep dysfunction.  Ms. Mikkelsen was unaccompanied during the current virtual appointment. Content of the current session focused on the results of her neuropsychological evaluation. Ms. Timothy was given the opportunity to ask questions and her questions  were answered. she was encouraged to reach out should additional questions arise.     20 minutes were spent conducting the current feedback session with Ms. Defeo, billed as one unit [phone]

## 2019-10-09 NOTE — Progress Notes (Signed)
Assessment/Plan:    1.  Tremor  -doing better.  Has propranolol for prn use but hasn't needed that.  Wonder if GAD contributed.    2.  Perceived memory change  -Neurocognitive testing was essentially normal.  He did feel that there may be some mild underlying anxiety and depression and sleep dysfunction that could be contributing to her perceived memory issues.  Patient can follow-up with primary care in regards to those issues.   3.  F/u prn  Subjective:   Jasmine Malone was seen today in follow up for tremor and memory change.  My previous records were reviewed prior to todays visit.  We started propranolol last visit and she reports that the shaking subsided so she has not needed any of it.  She had one fall - fell at her daughters apartment complex outside.  Not sure if due to poor eyesight or balance but she is working out at [employer] doing chair exercises at the senior center and thinks that this is already helping.  Pt denies lightheadedness, near syncope.  She had neurocognitive testing for her complaints of memory change.  This was normal.  Dr. Melvyn Novas felt that her weaknesses appear to be combination of mild symptoms of anxiety and depression and also from sleep dysfunction.  Pt states that the understanding of this really helped her.  Current prescribed movement disorder medications: Propranolol, 10 mg, 1-2 times per day as needed (started last visit)   ALLERGIES:   Allergies  Allergen Reactions  . Codeine Nausea Only    Can take Percocet with Zofran  . Ibuprofen Other (See Comments)    Irritates stomach due to hiatal hernia   . Shellfish Allergy Nausea Only    CURRENT MEDICATIONS:  Outpatient Encounter Medications as of 10/10/2019  Medication Sig  . albuterol (PROVENTIL HFA;VENTOLIN HFA) 108 (90 Base) MCG/ACT inhaler Inhale 2 puffs into the lungs every 6 (six) hours as needed for wheezing or shortness of breath (cough, shortness of breath or wheezing.).  Marland Kitchen FENUGREEK PO  Take 1 capsule by mouth daily as needed (for congestion).  Donnie Aho (MSM GLUCOSAMINE COMPLEX PO) Take 1 capsule by mouth daily.   . Multiple Vitamin (MULTIVITAMIN WITH MINERALS) TABS tablet Take 1 tablet by mouth daily.  . Omega-3 Fatty Acids (FISH OIL PO) Take 2 capsules by mouth daily.   . ondansetron (ZOFRAN ODT) 8 MG disintegrating tablet Take 1 tablet (8 mg total) by mouth 2 (two) times daily.  . pantoprazole (PROTONIX) 40 MG tablet TAKE 1 TABLET (40 MG TOTAL) BY MOUTH DAILY.  Marland Kitchen propranolol (INDERAL) 10 MG tablet Take 1 tablet (10 mg total) by mouth 2 (two) times daily. (Patient not taking: Reported on 10/10/2019)   Facility-Administered Encounter Medications as of 10/10/2019  Medication  . 0.9 %  sodium chloride infusion     Objective:    PHYSICAL EXAMINATION:    VITALS:   Vitals:   10/10/19 0818  BP: (!) 149/81  Pulse: 66  SpO2: 100%  Weight: 250 lb (113.4 kg)  Height: 5\' 6"  (1.676 m)    GEN:  The patient appears stated age and is in NAD. HEENT:  Normocephalic, atraumatic.  The mucous membranes are moist. The superficial temporal arteries are without ropiness or tenderness. CV:  RRR Lungs:  CTAB Neck/HEME:  There are no carotid bruits bilaterally.  Neurological examination:  Orientation: The patient is alert and oriented x3. Cranial nerves: There is good facial symmetry. The speech is fluent and clear.  Soft palate rises symmetrically and there is no tongue deviation. Hearing is intact to conversational tone. Sensation: Sensation is intact to light touch throughout Motor: Strength is at least antigravity x4.  Movement examination: Tone: There is normal tone in the UE/LE Abnormal movements: no postural tremor.  No rest tremor.  No intention tremor. Coordination:  There is no decremation with RAM's Gait and Station: The patient has no difficulty arising out of a deep-seated chair without the use of the hands. The patient's stride length is good I  have reviewed and interpreted the following labs independently   Chemistry      Component Value Date/Time   NA 143 11/26/2015 0822   K 4.0 11/26/2015 0822   CL 107 11/26/2015 0822   CO2 21 11/26/2015 0822   BUN 12 11/26/2015 0822   CREATININE 0.76 11/26/2015 0822      Component Value Date/Time   CALCIUM 9.0 11/26/2015 0822   ALKPHOS 74 11/26/2015 0822   AST 18 11/26/2015 0822   ALT 16 11/26/2015 0822   BILITOT 0.8 11/26/2015 0822      Lab Results  Component Value Date   WBC 7.5 12/01/2016   HGB 15.2 12/01/2016   HCT 45.1 12/01/2016   MCV 94.7 12/01/2016   PLT 225 02/15/2014   Lab Results  Component Value Date   TSH 2.986 04/25/2013     Chemistry      Component Value Date/Time   NA 143 11/26/2015 0822   K 4.0 11/26/2015 0822   CL 107 11/26/2015 0822   CO2 21 11/26/2015 0822   BUN 12 11/26/2015 0822   CREATININE 0.76 11/26/2015 0822      Component Value Date/Time   CALCIUM 9.0 11/26/2015 0822   ALKPHOS 74 11/26/2015 0822   AST 18 11/26/2015 0822   ALT 16 11/26/2015 0822   BILITOT 0.8 11/26/2015 0822         Total time spent on today's visit was 20 minutes, including both face-to-face time and nonface-to-face time.  Time included that spent on review of records (prior notes available to me/labs/imaging if pertinent), discussing treatment and goals, answering patient's questions and coordinating care.  Cc:  Wardell Honour, MD

## 2019-10-10 ENCOUNTER — Other Ambulatory Visit: Payer: Self-pay

## 2019-10-10 ENCOUNTER — Encounter: Payer: Self-pay | Admitting: Neurology

## 2019-10-10 ENCOUNTER — Ambulatory Visit: Payer: Medicare HMO | Admitting: Neurology

## 2019-10-10 VITALS — BP 149/81 | HR 66 | Ht 66.0 in | Wt 250.0 lb

## 2019-10-10 DIAGNOSIS — G3184 Mild cognitive impairment, so stated: Secondary | ICD-10-CM

## 2019-10-10 NOTE — Patient Instructions (Signed)
You look great!  No changes from my standpoint!  The physicians and staff at [facility] are committed to providing excellent care. You may receive a survey requesting feedback about your experience at our office. We strive to receive "very good" responses to the survey questions. If you feel that your experience would prevent you from giving the office a "very good " response, please contact our office to try to remedy the situation. We may be reached at [phone]. Thank you for taking the time out of your busy day to complete the survey.

## 2020-03-31 ENCOUNTER — Ambulatory Visit: Payer: Medicare HMO | Admitting: Neurology

## 2020-04-24 ENCOUNTER — Other Ambulatory Visit: Payer: Self-pay | Admitting: Family Medicine

## 2020-04-24 DIAGNOSIS — Z1231 Encounter for screening mammogram for malignant neoplasm of breast: Secondary | ICD-10-CM

## 2020-09-16 ENCOUNTER — Ambulatory Visit: Payer: Medicare HMO | Admitting: Dermatology

## 2020-09-16 ENCOUNTER — Other Ambulatory Visit: Payer: Self-pay

## 2020-09-16 DIAGNOSIS — D229 Melanocytic nevi, unspecified: Secondary | ICD-10-CM

## 2020-09-16 DIAGNOSIS — D2272 Melanocytic nevi of left lower limb, including hip: Secondary | ICD-10-CM

## 2020-09-16 DIAGNOSIS — L820 Inflamed seborrheic keratosis: Secondary | ICD-10-CM

## 2020-09-16 DIAGNOSIS — Z85828 Personal history of other malignant neoplasm of skin: Secondary | ICD-10-CM | POA: Diagnosis not present

## 2020-09-16 DIAGNOSIS — Z1283 Encounter for screening for malignant neoplasm of skin: Secondary | ICD-10-CM

## 2020-09-16 DIAGNOSIS — D1800 Hemangioma unspecified site: Secondary | ICD-10-CM

## 2020-09-16 DIAGNOSIS — L814 Other melanin hyperpigmentation: Secondary | ICD-10-CM

## 2020-09-16 DIAGNOSIS — D225 Melanocytic nevi of trunk: Secondary | ICD-10-CM

## 2020-09-16 DIAGNOSIS — L918 Other hypertrophic disorders of the skin: Secondary | ICD-10-CM

## 2020-09-16 DIAGNOSIS — L821 Other seborrheic keratosis: Secondary | ICD-10-CM

## 2020-09-16 DIAGNOSIS — L578 Other skin changes due to chronic exposure to nonionizing radiation: Secondary | ICD-10-CM

## 2020-09-16 NOTE — Progress Notes (Signed)
New Patient Visit  Subjective  Jasmine Malone is a 75 y.o. female who presents for the following: New Patient (Initial Visit) (Patient here today as a new patient concerning a spot on right arm and for tbse.   Patient reports a history of scc skin cancer on right arm about 10 years ago. She also would also like a spot on her back checked today.).  Spot on arm and back is itchy   Patient here for full body skin exam and skin cancer screening.   Objective  Well appearing patient in no apparent distress; mood and affect are within normal limits.  A full examination was performed including scalp, head, eyes, ears, nose, lips, neck, chest, axillae, abdomen, back, buttocks, bilateral upper extremities, bilateral lower extremities, hands, feet, fingers, toes, fingernails, and toenails. All findings within normal limits unless otherwise noted below.  Objective  right upper arm x 1,spinal upper back x 1, spinal mid back x 1, right upper eyebrow x 1 (4): Erythematous keratotic or waxy stuck-on papule   Objective  Right Abdomen: 4 mm speckled brown macule   Right Lower Back: 6 x 2 mm dark brown macule   Left Lower Back: 7 x 4 mm medium brown flat papule with darker center   spinal upper back: 3 mm dark brown macule slightly waxy   left lateral thigh: 2 mm dark brown macule   Images              Assessment & Plan  Inflamed seborrheic keratosis (4) right upper arm x 1,spinal upper back x 1, spinal mid back x 1, right upper eyebrow x 1  Prior to procedure, discussed risks of blister formation, [place] wound, skin dyspigmentation, or rare scar following cryotherapy.    Destruction of lesion - right upper arm x 1,spinal upper back x 1, spinal mid back x 1, right upper eyebrow x 1  Destruction method: cryotherapy   Informed consent: discussed and consent obtained   Timeout:  patient name, date of birth, surgical site, and procedure verified Lesion destroyed using liquid  nitrogen: Yes   Region frozen until ice ball extended beyond lesion: Yes   Outcome: patient tolerated procedure well with no complications   Post-procedure details: wound care instructions given    Nevus (5) left lateral thigh; Right Abdomen; Left Lower Back; Right Lower Back; spinal upper back  Benign-appearing.  Observation.  Call clinic for new or changing moles.  Recommend daily use of broad spectrum spf 30+ sunscreen to sun-exposed areas.     Lentigines - Scattered tan macules - Due to sun exposure - Benign-appering, observe - Recommend daily broad spectrum sunscreen SPF 30+ to sun-exposed areas, reapply every 2 hours as needed. - Call for any changes  Acrochordons (Skin Tags) - Fleshy, skin-colored pedunculated papules - Benign appearing.  - Observe. - If desired, they can be removed with an in office procedure that is not covered by insurance. - Please call the clinic if you notice any new or changing lesions.   Seborrheic Keratoses - Stuck-on, waxy, tan-brown papules and/or plaques on spinal mid back  - Benign-appearing - Discussed benign etiology and prognosis. - Observe - Call for any changes  Melanocytic Nevi - Tan-brown and/or pink-flesh-colored symmetric macules and papules - Benign appearing on exam today - Observation - Call clinic for new or changing moles - Recommend daily use of broad spectrum spf 30+ sunscreen to sun-exposed areas.   Hemangiomas - Red papules - Discussed benign nature - Observe -  Call for any changes  Actinic Damage - Chronic condition, secondary to cumulative UV/sun exposure - diffuse scaly erythematous macules with underlying dyspigmentation - Recommend daily broad spectrum sunscreen SPF 30+ to sun-exposed areas, reapply every 2 hours as needed.  - Staying in the shade or wearing long sleeves, sun glasses (UVA+UVB protection) and wide brim hats (4-inch brim around the entire circumference of the hat) are also recommended for sun  protection.  - Call for new or changing lesions.  History of Squamous Cell Carcinoma of the Skin - No evidence of recurrence today right forearm  - Recommend regular full body skin exams - Recommend daily broad spectrum sunscreen SPF 30+ to sun-exposed areas, reapply every 2 hours as needed.  - Call if any new or changing lesions are noted between office visits   Skin cancer screening performed today.  Return in about 3 months (around 12/17/2020) for recheck moles/ISKs , 1 year tbse.   I, Ruthell Rummage, CMA, am acting as scribe for Brendolyn Patty, MD.  Documentation: I have reviewed the above documentation for accuracy and completeness, and I agree with the above.  Brendolyn Patty MD

## 2020-09-16 NOTE — Patient Instructions (Addendum)

## 2020-12-29 ENCOUNTER — Ambulatory Visit: Payer: Medicare HMO | Admitting: Dermatology

## 2020-12-29 ENCOUNTER — Other Ambulatory Visit: Payer: Self-pay

## 2020-12-29 DIAGNOSIS — D225 Melanocytic nevi of trunk: Secondary | ICD-10-CM

## 2020-12-29 DIAGNOSIS — D2272 Melanocytic nevi of left lower limb, including hip: Secondary | ICD-10-CM | POA: Diagnosis not present

## 2020-12-29 DIAGNOSIS — L821 Other seborrheic keratosis: Secondary | ICD-10-CM | POA: Diagnosis not present

## 2020-12-29 DIAGNOSIS — D229 Melanocytic nevi, unspecified: Secondary | ICD-10-CM

## 2020-12-29 NOTE — Progress Notes (Signed)
   Follow-Up Visit   Subjective  Jasmine Malone is a 75 y.o. female who presents for the following: ISKs (R upper arm, spinal upper back, spinal mid back, R upper eyebrow, 15mf/u), check spot (R lower leg, pt just noticed, no symptoms), and recheck moles (Back, abdomen, L leg, 361m/u). ISKs have cleared up with the freezing.   The following portions of the chart were reviewed this encounter and updated as appropriate:       Review of Systems:  No other skin or systemic complaints except as noted in HPI or Assessment and Plan.  Objective  Well appearing patient in no apparent distress; mood and affect are within normal limits.  A focused examination was performed including face, trunk, legs. Relevant physical exam findings are noted in the Assessment and Plan.  L lat thigh; R lower abdomen; Left Lower Back; Right Lower Back; spinal upper back  R lower abdomen 5.66m33mpeckled brown macule  Right Lower Back 6.0 x 2.66mm83mrk brown macule  spinal upper back 3.66mm 25mk brown macule slightly waxy  Left Lower Back 7.0 x 4.66mm m88mbrown flat pap with darker center  L lat thigh 2.66mm da46mbrown macule  R lat pretibial Stuck-on, waxy, tan-brown papules -- Discussed benign etiology and prognosis.   Previously treated ISKs are clear   Assessment & Plan  Nevus (5) L lat thigh; R lower abdomen; Left Lower Back; Right Lower Back; spinal upper back  Stable  Benign-appearing.  Observation.  Call clinic for new or changing moles.  Recommend daily use of broad spectrum spf 30+ sunscreen to sun-exposed areas.    Seborrheic keratosis R lat pretibial  Reassured benign age-related growth.  Recommend observation.  Discussed cryotherapy if spot(s) become irritated or inflamed.    Return in about 6 months (around 07/01/2021) for TBSE, recheck moles.  I, Sonya HOthelia Pullingam acting as scribe for Braidyn Scorsone StBrendolyn Patty Documentation: I have reviewed the above documentation for accuracy and  completeness, and I agree with the above.  Aizah Gehlhausen StBrendolyn Patty

## 2020-12-29 NOTE — Patient Instructions (Signed)

## 2021-07-07 ENCOUNTER — Ambulatory Visit: Payer: Medicare HMO | Admitting: Dermatology

## 2021-07-17 DIAGNOSIS — J3801 Paralysis of vocal cords and larynx, unilateral: Secondary | ICD-10-CM | POA: Insufficient documentation

## 2021-07-22 ENCOUNTER — Ambulatory Visit: Payer: Medicare HMO | Admitting: Dermatology

## 2021-07-22 ENCOUNTER — Other Ambulatory Visit: Payer: Self-pay

## 2021-07-22 ENCOUNTER — Other Ambulatory Visit: Payer: Self-pay | Admitting: Dermatology

## 2021-07-22 DIAGNOSIS — D1800 Hemangioma unspecified site: Secondary | ICD-10-CM

## 2021-07-22 DIAGNOSIS — Z1283 Encounter for screening for malignant neoplasm of skin: Secondary | ICD-10-CM | POA: Diagnosis not present

## 2021-07-22 DIAGNOSIS — L821 Other seborrheic keratosis: Secondary | ICD-10-CM

## 2021-07-22 DIAGNOSIS — D229 Melanocytic nevi, unspecified: Secondary | ICD-10-CM

## 2021-07-22 DIAGNOSIS — L578 Other skin changes due to chronic exposure to nonionizing radiation: Secondary | ICD-10-CM

## 2021-07-22 DIAGNOSIS — L814 Other melanin hyperpigmentation: Secondary | ICD-10-CM

## 2021-07-22 DIAGNOSIS — Z85828 Personal history of other malignant neoplasm of skin: Secondary | ICD-10-CM

## 2021-07-22 DIAGNOSIS — D2272 Melanocytic nevi of left lower limb, including hip: Secondary | ICD-10-CM | POA: Diagnosis not present

## 2021-07-22 DIAGNOSIS — D239 Other benign neoplasm of skin, unspecified: Secondary | ICD-10-CM

## 2021-07-22 DIAGNOSIS — D225 Melanocytic nevi of trunk: Secondary | ICD-10-CM

## 2021-07-22 DIAGNOSIS — D489 Neoplasm of uncertain behavior, unspecified: Secondary | ICD-10-CM

## 2021-07-22 HISTORY — DX: Other benign neoplasm of skin, unspecified: D23.9

## 2021-07-22 NOTE — Patient Instructions (Addendum)
Biopsy Wound Care Instructions ? ?Leave the original bandage on for 24 hours if possible.  If the bandage becomes soaked or soiled before that time, it is OK to remove it and examine the wound.  A Thies amount of post-operative bleeding is normal.  If excessive bleeding occurs, remove the bandage, place gauze over the site and apply continuous pressure (no peeking) over the area for 30 minutes. If this does not work, please call our clinic as soon as possible or page your doctor if it is after hours.  ? ?Once a day, cleanse the wound with soap and water. It is fine to shower. If a thick crust develops you may use a Q-tip dipped into dilute hydrogen peroxide (mix 1:1 with water) to dissolve it.  Hydrogen peroxide can slow the healing process, so use it only as needed.   ? ?After washing, apply petroleum jelly (Vaseline) or an antibiotic ointment if your doctor prescribed one for you, followed by a bandage.   ? ?For best healing, the wound should be covered with a layer of ointment at all times. If you are not able to keep the area covered with a bandage to hold the ointment in place, this may mean re-applying the ointment several times a day.  Continue this wound care until the wound has healed and is no longer open.  ? ?Itching and mild discomfort is normal during the healing process. However, if you develop pain or severe itching, please call our office.  ? ?If you have any discomfort, you can take Tylenol (acetaminophen) or ibuprofen as directed on the bottle. (Please do not take these if you have an allergy to them or cannot take them for another reason). ? ?Some redness, tenderness and white or yellow material in the wound is normal healing.  If the area becomes very sore and red, or develops a thick yellow-green material (pus), it may be infected; please notify us.   ? ?If you have stitches, return to clinic as directed to have the stitches removed. You will continue wound care for 2-3 days after the stitches  are removed.  ? ?Wound healing continues for up to one year following surgery. It is not unusual to experience pain in the scar from time to time during the interval.  If the pain becomes severe or the scar thickens, you should notify the office.   ? ?A slight amount of redness in a scar is expected for the first six months.  After six months, the redness will fade and the scar will soften and fade.  The color difference becomes less noticeable with time.  If there are any problems, return for a post-op surgery check at your earliest convenience. ? ?To improve the appearance of the scar, you can use silicone scar gel, cream, or sheets (such as Mederma or Serica) every night for up to one year. These are available over the counter (without a prescription). ? ?Please call our office at [phone] for any questions or concerns. ? ? ? ? ? ? ? ?Melanoma ABCDEs ? ?Melanoma is the most dangerous type of skin cancer, and is the leading cause of death from skin disease.  You are more likely to develop melanoma if you: ?Have light-colored skin, light-colored eyes, or red or blond hair ?Spend a lot of time in the sun ?Tan regularly, either outdoors or in a tanning bed ?Have had blistering sunburns, especially during childhood ?Have a close family member who has had a melanoma ?Have atypical  moles or large birthmarks ? ?Early detection of melanoma is key since treatment is typically straightforward and cure rates are extremely high if we catch it early.  ? ?The first sign of melanoma is often a change in a mole or a new dark spot.  The ABCDE system is a way of remembering the signs of melanoma. ? ?A for asymmetry:  The two halves do not match. ?B for border:  The edges of the growth are irregular. ?C for color:  A mixture of colors are present instead of an even brown color. ?D for diameter:  Melanomas are usually (but not always) greater than 40m - the size of a pencil eraser. ?E for evolution:  The spot keeps changing in  size, shape, and color. ? ?Please check your skin once per month between visits. You can use a Lievanos mirror in front and a large mirror behind you to keep an eye on the back side or your body.  ? ?If you see any new or changing lesions before your next follow-up, please call to schedule a visit. ? ?Please continue daily skin protection including broad spectrum sunscreen SPF 30+ to sun-exposed areas, reapplying every 2 hours as needed when you're outdoors.  ? ?Staying in the shade or wearing long sleeves, sun glasses (UVA+UVB protection) and wide brim hats (4-inch brim around the entire circumference of the hat) are also recommended for sun protection.   ? ?If You Need Anything After Your Visit ? ?If you have any questions or concerns for your doctor, please call our main line at [phone] press option 4 to reach your doctor's medical assistant. If no one answers, please leave a voicemail as directed and we will return your call as soon as possible. Messages left after 4 pm will be answered the following business day.  ? ?You may also send [place] message via MyChart. We typically respond to MyChart messages within 1-2 business days. ? ?For prescription refills, please ask your pharmacy to contact our office. Our fax number is [phone] ? ?If you have an urgent issue when the clinic is closed that cannot wait until the next business day, you can page your doctor at the number below.   ? ?Please note that while we do our best to be available for urgent issues outside of office hours, we are not available 24/7.  ? ?If you have an urgent issue and are unable to reach [place] you may choose to seek medical care at your doctor's office, retail clinic, urgent care center, or emergency room. ? ?If you have a medical emergency, please immediately call 911 or go to the emergency department. ? ?Pager Numbers ? ?- Dr. KNehemiah Massed [phone]? ?- Dr. MLaurence Ferrari [phone]? ?- Dr. SNicole Kindred [phone]? ?In the event of  inclement weather, please call our main line at [phone] an update on the status of any delays or closures. ? ?Dermatology Medication Tips: ?Please keep the boxes that topical medications come in in order to help keep track of the instructions about where and how to use these. Pharmacies typically print the medication instructions only on the boxes and not directly on the medication tubes.  ? ?If your medication is too expensive, please contact our office at [phone] 4 or send [place] message through [facility]  ? ?We are unable to tell what your co-pay for medications will be in advance as this is different depending on your insurance coverage. However, we may be able to  find a substitute medication at lower cost or fill out paperwork to get insurance to cover a needed medication.  ? ?If a prior authorization is required to get your medication covered by your insurance company, please allow [place] 1-2 business days to complete this process. ? ?Drug prices often vary depending on where the prescription is filled and some pharmacies may offer cheaper prices. ? ?The website www.goodrx.com contains coupons for medications through different pharmacies. The prices here do not account for what the cost may be with help from insurance (it may be cheaper with your insurance), but the website can give you the price if you did not use any insurance.  ?- You can print the associated coupon and take it with your prescription to the pharmacy.  ?- You may also stop by our office during regular business hours and pick up a GoodRx coupon card.  ?- If you need your prescription sent electronically to a different pharmacy, notify our office through [facility] or by phone at [phone] option 4. ? ? ? ? ?Si Usted Necesita Algo Despu?s de Su Visita ? ?Tambi?n puede enviarnos un mensaje a trav?s de MyChart. Por lo general respondemos a los mensajes de MyChart en el transcurso de 1 a 2 d?as h?biles. ? ?Para renovar  recetas, por favor pida a su farmacia que se ponga en contacto con nuestra oficina. Nuestro n?mero de fax es el [phone]. ? ?Si tiene un asunto urgente cuando la cl?nica est? cerrada y que no puede esperar

## 2021-07-22 NOTE — Progress Notes (Signed)
? ?Follow-Up Visit ?  ?Subjective  ?Jasmine Malone is a 76 y.o. female who presents for the following: Follow-up (Patient here today for 6 month tbse. Patient has history of scc and isks. ). ? ?The patient presents for Total-Body Skin Exam (TBSE) for skin cancer screening and mole check.  The patient has spots, moles and lesions to be evaluated, some may be new or changing and the patient has concerns that these could be cancer. ? ? ? ?The following portions of the chart were reviewed this encounter and updated as appropriate:   ?  ? ?Review of Systems: No other skin or systemic complaints except as noted in HPI or Assessment and Plan. ? ? ?Objective  ?Well appearing patient in no apparent distress; mood and affect are within normal limits. ? ?A full examination was performed including scalp, head, eyes, ears, nose, lips, neck, chest, axillae, abdomen, back, buttocks, bilateral upper extremities, bilateral lower extremities, hands, feet, fingers, toes, fingernails, and toenails. All findings within normal limits unless otherwise noted below. ? ?right lower abdomen ?5 mm speckled brown macule  ? ?Right Lower Back ?5 x 2 dark brown macule  ? ?Left Lower Back ?7 x 4 mm med brown flat pap with darker center  ? ?left lateral thigh ?2 mm dark brown macule  ? ?spinal upper back ?3 mm medium brown macule with slightly irregular border  ? ? ? ? ? ? ? ?Assessment & Plan  ?Nevus (4) ?left lateral thigh; right lower abdomen; Left Lower Back; Right Lower Back ? ?Benign-appearing.  Stable. Observation.  Call clinic for new or changing moles.  Recommend daily use of broad spectrum spf 30+ sunscreen to sun-exposed areas.   ? ?Neoplasm of uncertain behavior ?spinal upper back ? ?Epidermal / dermal shaving ? ?Lesion diameter (cm):  0.5 ?Informed consent: discussed and consent obtained   ?Patient was prepped and draped in usual sterile fashion: Area prepped with alcohol. ?Anesthesia: the lesion was anesthetized in a standard fashion    ?Anesthetic:  1% lidocaine w/ epinephrine 1-100,000 buffered w/ 8.4% NaHCO3 ?Instrument used: flexible razor blade   ?Hemostasis achieved with: pressure, aluminum chloride and electrodesiccation   ?Outcome: patient tolerated procedure well   ?Post-procedure details: wound care instructions given   ?Post-procedure details comment:  Ointment and [place] bandage applied.  ? ?Specimen 1 - Surgical pathology ?Differential Diagnosis: nevus vs dysplasia  ? ?Check Margins: No ? ?Nevus r/o dysplasia  ? ? ?Lentigines ?- Scattered tan macules ?- Due to sun exposure ?- Benign-appearing, observe ?- Recommend daily broad spectrum sunscreen SPF 30+ to sun-exposed areas, reapply every 2 hours as needed. ?- Call for any changes ? ?Seborrheic Keratoses ?- Stuck-on, waxy, tan-brown papules and/or plaques  ?- Benign-appearing ?- Discussed benign etiology and prognosis. ?- Observe ?- Call for any changes ? ?Melanocytic Nevi ?- Tan-brown and/or pink-flesh-colored symmetric macules and papules ?- Benign appearing on exam today ?- Observation ?- Call clinic for new or changing moles ?- Recommend daily use of broad spectrum spf 30+ sunscreen to sun-exposed areas.  ? ?Hemangiomas ?- Red papules ?- Discussed benign nature ?- Observe ?- Call for any changes ? ?Actinic Damage ?- Chronic condition, secondary to cumulative UV/sun exposure ?- diffuse scaly erythematous macules with underlying dyspigmentation ?- Recommend daily broad spectrum sunscreen SPF 30+ to sun-exposed areas, reapply every 2 hours as needed.  ?- Staying in the shade or wearing long sleeves, sun glasses (UVA+UVB protection) and wide brim hats (4-inch brim around the entire circumference of the  hat) are also recommended for sun protection.  ?- Call for new or changing lesions. ? ?History of Squamous Cell Carcinoma of the Skin ?- No evidence of recurrence today right forearm  ?- Recommend regular full body skin exams ?- Recommend daily broad spectrum sunscreen SPF 30+ to  sun-exposed areas, reapply every 2 hours as needed.  ?- Call if any new or changing lesions are noted between office visits ? ? ? ?Skin cancer screening performed today. ?Return for 6 month mole check. ?I, Ruthell Rummage, CMA, am acting as scribe for Brendolyn Patty, MD. ? ?Documentation: I have reviewed the above documentation for accuracy and completeness, and I agree with the above. ? ?Brendolyn Patty MD  ? ?

## 2021-07-28 ENCOUNTER — Telehealth: Payer: Self-pay

## 2021-07-28 NOTE — Telephone Encounter (Signed)
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 ?Mildly atypical mole and benign age-related growth (SK) - observe ? ?-please call patient ?

## 2021-07-28 NOTE — Telephone Encounter (Signed)
Patient advised biopsy of the spinal upper back was dysplastic nevus with mild atypia. No further treatment needed. Will recheck on follow-up appt.  ?

## 2021-10-10 ENCOUNTER — Emergency Department
Admission: EM | Admit: 2021-10-10 | Discharge: 2021-10-10 | Disposition: A | Discharge status: Home / Self Care | Payer: Medicare HMO | Attending: Emergency Medicine | Admitting: Emergency Medicine

## 2021-10-10 ENCOUNTER — Encounter: Payer: Self-pay | Admitting: Emergency Medicine

## 2021-10-10 ENCOUNTER — Other Ambulatory Visit: Payer: Self-pay

## 2021-10-10 DIAGNOSIS — N390 Urinary tract infection, site not specified: Secondary | ICD-10-CM | POA: Insufficient documentation

## 2021-10-10 DIAGNOSIS — R410 Disorientation, unspecified: Secondary | ICD-10-CM | POA: Diagnosis not present

## 2021-10-10 DIAGNOSIS — R300 Dysuria: Secondary | ICD-10-CM | POA: Diagnosis present

## 2021-10-10 LAB — BASIC METABOLIC PANEL
Anion gap: 6 (ref 5–15)
BUN: 14 mg/dL (ref 8–23)
CO2: 26 mmol/L (ref 22–32)
Calcium: 9.1 mg/dL (ref 8.9–10.3)
Chloride: 111 mmol/L (ref 98–111)
Creatinine, Ser: 0.65 mg/dL (ref 0.44–1.00)
GFR, Estimated: >60 mL/min (ref >60)
Glucose, Bld: 95 mg/dL (ref 70–99)
Potassium: 3.6 mmol/L (ref 3.5–5.1)
Sodium: 143 mmol/L (ref 135–145)

## 2021-10-10 LAB — CBC
HCT: 41.6 % (ref 36.0–46.0)
Hemoglobin: 13.7 g/dL (ref 12.0–15.0)
MCH: 31.9 pg (ref 26.0–34.0)
MCHC: 32.9 g/dL (ref 30.0–36.0)
MCV: 96.7 fL (ref 80.0–100.0)
Platelets: 185 10*3/uL (ref 150–400)
RBC: 4.3 MIL/uL (ref 3.87–5.11)
RDW: 13.3 % (ref 11.5–15.5)
WBC: 7.4 10*3/uL (ref 4.0–10.5)
nRBC: 0 % (ref 0.0–0.2)

## 2021-10-10 LAB — URINALYSIS, ROUTINE W REFLEX MICROSCOPIC
Bilirubin Urine: NEGATIVE
Glucose, UA: NEGATIVE mg/dL
Ketones, ur: NEGATIVE mg/dL
Nitrite: NEGATIVE
Protein, ur: NEGATIVE mg/dL
Specific Gravity, Urine: 1.009 (ref 1.005–1.030)
WBC, UA: >50 WBC/hpf — ABNORMAL HIGH (ref 0–5)
pH: 6 (ref 5.0–8.0)

## 2021-10-10 MED ORDER — CEFDINIR 300 MG PO CAPS: 300.0000 mg | ORAL_CAPSULE | Freq: Two times a day (BID) | ORAL | 0 refills | Status: AC

## 2021-10-10 NOTE — ED Notes (Signed)
Pt presents to ED with c/o of having dysuria and increased frequency. Pt states HX of UTI's. Pt states recently TX'ed for one as well. Pt denies fevers or chills. Pt is A&Ox4 but states she feels slightly confused but also states this happens every time she gets a UTI. Husband at bedside.

## 2021-10-10 NOTE — ED Triage Notes (Signed)
Pt via POV from home. Pt c/o dysuria and disorientation since this AM. States that this happened approx a month ago and stated she was dx with a  UTI. Denies NVD. Denies pain. Denies fever. Pt is A&Ox4 and NAD

## 2021-10-10 NOTE — ED Provider Notes (Signed)
[facility] Provider Note    Event Date/Time   First MD Initiated Contact with Patient 10/10/21 1509     (approximate)   History   Dysuria   HPI  Jasmine Malone is a 76 y.o. female  who, per outside hospital note dated 08/25/21 had been seen for dysuria and forgetfulness and diagnosed with UTI, who presents to the emergency department today because of concern for urinary tract infection. The patient says that she started to notice confusion today.  Additionally she has had some burning with urination.  This is how she has presented with previous urinary tract infections.       Physical Exam   Triage Vital Signs: ED Triage Vitals  Enc Vitals Group     BP 10/10/21 1311 (!) 166/80     Pulse Rate 10/10/21 1311 65     Resp 10/10/21 1311 18     Temp 10/10/21 1311 98 F (36.7 C)     Temp Source 10/10/21 1311 Oral     SpO2 10/10/21 1311 100 %     Weight 10/10/21 1309 210 lb (95.3 kg)     Height 10/10/21 1309 '5\' 6"'$  (1.676 m)     Head Circumference --      Peak Flow --      Pain Score 10/10/21 1309 0     Pain Loc --      Pain Edu? --      Excl. in [place]? --     Most recent vital signs: Vitals:   10/10/21 1311  BP: (!) 166/80  Pulse: 65  Resp: 18  Temp: 98 F (36.7 C)  SpO2: 100%    General: Awake, alert, oriented. CV:  Good peripheral perfusion. Regular rate and rhythm. Resp:  Normal effort. Lungs clear.  Abd:  No distention. Non tender.   ED Results / Procedures / Treatments   Labs (all labs ordered are listed, but only abnormal results are displayed) Labs Reviewed  URINALYSIS, ROUTINE W REFLEX MICROSCOPIC - Abnormal; Notable for the following components:      Result Value   Color, Urine YELLOW (*)    APPearance HAZY (*)    Hgb urine dipstick [place] (*)    Leukocytes,Ua LARGE (*)    WBC, UA >50 (*)    Bacteria, UA RARE (*)    Non Squamous Epithelial PRESENT (*)    All other components within normal limits  CBC  BASIC METABOLIC  PANEL    EKG  None   RADIOLOGY None   PROCEDURES:  Critical Care performed: No  Procedures   MEDICATIONS ORDERED IN ED: Medications - No data to display   IMPRESSION / MDM / [place] / ED COURSE  I reviewed the triage vital signs and the nursing notes.                              Differential diagnosis includes, but is not limited to, urinary tract infection, anemia, electrolyte abnormality, sepsis.  Patient's presentation is most consistent with acute presentation with potential threat to life or bodily function.  Patient presented to the emergency department today because of concerns for confusion and dysuria.  On exam patient is awake alert and oriented.  Blood work without concerning leukocytosis or electrolyte abnormality. No findings to suggest sepsis at this time. Urine is consistent with infection.  Patient states that when she had similar symptoms in the past she had  extensive work-up done at [facility] with final diagnosis of UTI.  This time patient felt comfortable deferring any further work-up for confusion and I think this is completely reasonable.  I did offer IV antibiotic dose however patient felt comfortable treating with oral antibiotics I think this is reasonable.  Will give patient prescription for antibiotics.  FINAL CLINICAL IMPRESSION(S) / ED DIAGNOSES   Final diagnoses:  Lower urinary tract infectious disease      Note:  This document was prepared using Dragon voice recognition software and may include unintentional dictation errors.    Nance Pear, MD 10/10/21 1600

## 2021-10-10 NOTE — Discharge Instructions (Addendum)
Please seek medical attention for any high fevers, chest pain, shortness of breath, change in behavior, persistent vomiting, bloody stool or any other new or concerning symptoms.  

## 2021-10-15 DIAGNOSIS — C73 Malignant neoplasm of thyroid gland: Secondary | ICD-10-CM | POA: Diagnosis present

## 2021-10-15 DIAGNOSIS — D3501 Benign neoplasm of right adrenal gland: Secondary | ICD-10-CM | POA: Insufficient documentation

## 2021-10-16 DIAGNOSIS — E042 Nontoxic multinodular goiter: Secondary | ICD-10-CM | POA: Insufficient documentation

## 2021-10-28 ENCOUNTER — Encounter: Payer: Self-pay | Admitting: Internal Medicine

## 2021-11-16 ENCOUNTER — Other Ambulatory Visit: Payer: Self-pay | Admitting: Family Medicine

## 2021-11-16 DIAGNOSIS — Z1231 Encounter for screening mammogram for malignant neoplasm of breast: Secondary | ICD-10-CM

## 2021-12-09 ENCOUNTER — Ambulatory Visit
Admission: RE | Admit: 2021-12-09 | Discharge: 2021-12-09 | Disposition: A | Discharge status: Home / Self Care | Payer: Medicare HMO | Source: Ambulatory Visit | Attending: Family Medicine | Admitting: Family Medicine

## 2021-12-09 DIAGNOSIS — Z1231 Encounter for screening mammogram for malignant neoplasm of breast: Secondary | ICD-10-CM | POA: Diagnosis not present

## 2022-02-02 ENCOUNTER — Ambulatory Visit: Payer: Medicare HMO | Admitting: Dermatology

## 2022-02-02 DIAGNOSIS — D225 Melanocytic nevi of trunk: Secondary | ICD-10-CM

## 2022-02-02 DIAGNOSIS — L821 Other seborrheic keratosis: Secondary | ICD-10-CM

## 2022-02-02 DIAGNOSIS — Z86018 Personal history of other benign neoplasm: Secondary | ICD-10-CM

## 2022-02-02 DIAGNOSIS — D229 Melanocytic nevi, unspecified: Secondary | ICD-10-CM

## 2022-02-02 NOTE — Patient Instructions (Signed)
     Melanoma ABCDEs  Melanoma is the most dangerous type of skin cancer, and is the leading cause of death from skin disease.  You are more likely to develop melanoma if you: Have light-colored skin, light-colored eyes, or red or blond hair Spend a lot of time in the sun Tan regularly, either outdoors or in a tanning bed Have had blistering sunburns, especially during childhood Have a close family member who has had a melanoma Have atypical moles or large birthmarks  Early detection of melanoma is key since treatment is typically straightforward and cure rates are extremely high if we catch it early.   The first sign of melanoma is often a change in a mole or a new dark spot.  The ABCDE system is a way of remembering the signs of melanoma.  A for asymmetry:  The two halves do not match. B for border:  The edges of the growth are irregular. C for color:  A mixture of colors are present instead of an even brown color. D for diameter:  Melanomas are usually (but not always) greater than 6mm - the size of a pencil eraser. E for evolution:  The spot keeps changing in size, shape, and color.  Please check your skin once per month between visits. You can use a [place] mirror in front and a large mirror behind you to keep an eye on the back side or your body.   If you see any new or changing lesions before your next follow-up, please call to schedule a visit.  Please continue daily skin protection including broad spectrum sunscreen SPF 30+ to sun-exposed areas, reapplying every 2 hours as needed when you're outdoors.   Staying in the shade or wearing long sleeves, sun glasses (UVA+UVB protection) and wide brim hats (4-inch brim around the entire circumference of the hat) are also recommended for sun protection.    Due to recent changes in healthcare laws, you may see results of your pathology and/or laboratory studies on MyChart before the doctors have had a chance to review them. We  understand that in some cases there may be results that are confusing or concerning to you. Please understand that not all results are received at the same time and often the doctors may need to interpret multiple results in order to provide you with the best plan of care or course of treatment. Therefore, we ask that you please give us 2 business days to thoroughly review all your results before contacting the office for clarification. Should we see a critical lab result, you will be contacted sooner.   If You Need Anything After Your Visit  If you have any questions or concerns for your doctor, please call our main line at [phone] and press option 4 to reach your doctor's medical assistant. If no one answers, please leave a voicemail as directed and we will return your call as soon as possible. Messages left after 4 pm will be answered the following business day.   You may also send us a message via MyChart. We typically respond to MyChart messages within 1-2 business days.  For prescription refills, please ask your pharmacy to contact our office. Our fax number is [phone].  If you have an urgent issue when the clinic is closed that cannot wait until the next business day, you can page your doctor at the number below.    Please note that while we do our best to be available for urgent issues   outside of office hours, we are not available 24/7.   If you have an urgent issue and are unable to reach us, you may choose to seek medical care at your doctor's office, retail clinic, urgent care center, or emergency room.  If you have a medical emergency, please immediately call 911 or go to the emergency department.  Pager Numbers  - Dr. Kowalski: [phone]  - Dr. Moye: [phone]  - Dr. Stewart: [phone]  In the event of inclement weather, please call our main line at [phone] for an update on the status of any delays or closures.  Dermatology Medication Tips: Please  keep the boxes that topical medications come in in order to help keep track of the instructions about where and how to use these. Pharmacies typically print the medication instructions only on the boxes and not directly on the medication tubes.   If your medication is too expensive, please contact our office at [phone] option 4 or send us a message through MyChart.   We are unable to tell what your co-pay for medications will be in advance as this is different depending on your insurance coverage. However, we may be able to find a substitute medication at lower cost or fill out paperwork to get insurance to cover a needed medication.   If a prior authorization is required to get your medication covered by your insurance company, please allow us 1-2 business days to complete this process.  Drug prices often vary depending on where the prescription is filled and some pharmacies may offer cheaper prices.  The website www.goodrx.com contains coupons for medications through different pharmacies. The prices here do not account for what the cost may be with help from insurance (it may be cheaper with your insurance), but the website can give you the price if you did not use any insurance.  - You can print the associated coupon and take it with your prescription to the pharmacy.  - You may also stop by our office during regular business hours and pick up a GoodRx coupon card.  - If you need your prescription sent electronically to a different pharmacy, notify our office through [facility] MyChart or by phone at [phone] option 4.     Si Usted Necesita Algo Despus de Su Visita  Tambin puede enviarnos un mensaje a travs de MyChart. Por lo general respondemos a los mensajes de MyChart en el transcurso de 1 a 2 das hbiles.  Para renovar recetas, por favor pida a su farmacia que se ponga en contacto con nuestra oficina. Nuestro nmero de fax es el [phone].  Si tiene un asunto urgente  cuando la clnica est cerrada y que no puede esperar hasta el siguiente da hbil, puede llamar/localizar a su doctor(a) al nmero que aparece a continuacin.   Por favor, tenga en cuenta que aunque hacemos todo lo posible para estar disponibles para asuntos urgentes fuera del horario de oficina, no estamos disponibles las 24 horas del da, los 7 das de la semana.   Si tiene un problema urgente y no puede comunicarse con nosotros, puede optar por buscar atencin mdica  en el consultorio de su doctor(a), en una clnica privada, en un centro de atencin urgente o en una sala de emergencias.  Si tiene una emergencia mdica, por favor llame inmediatamente al 911 o vaya a la sala de emergencias.  Nmeros de bper  - Dr. Kowalski: [phone]  - Dra. Moye: [phone]  - Dra. Stewart: [phone]  En caso   de inclemencias del tiempo, por favor llame a nuestra lnea principal al [phone] para una actualizacin sobre el estado de cualquier retraso o cierre.  Consejos para la medicacin en dermatologa: Por favor, guarde las cajas en las que vienen los medicamentos de uso tpico para ayudarle a seguir las instrucciones sobre dnde y cmo usarlos. Las farmacias generalmente imprimen las instrucciones del medicamento slo en las cajas y no directamente en los tubos del medicamento.   Si su medicamento es muy caro, por favor, pngase en contacto con nuestra oficina llamando al [phone] y presione la opcin 4 o envenos un mensaje a travs de MyChart.   No podemos decirle cul ser su copago por los medicamentos por adelantado ya que esto es diferente dependiendo de la cobertura de su seguro. Sin embargo, es posible que podamos encontrar un medicamento sustituto a menor costo o llenar un formulario para que el seguro cubra el medicamento que se considera necesario.   Si se requiere una autorizacin previa para que su compaa de seguros cubra su medicamento, por favor permtanos de 1 a 2  das hbiles para completar este proceso.  Los precios de los medicamentos varan con frecuencia dependiendo del lugar de dnde se surte la receta y alguna farmacias pueden ofrecer precios ms baratos.  El sitio web www.goodrx.com tiene cupones para medicamentos de diferentes farmacias. Los precios aqu no tienen en cuenta lo que podra costar con la ayuda del seguro (puede ser ms barato con su seguro), pero el sitio web puede darle el precio si no utiliz ningn seguro.  - Puede imprimir el cupn correspondiente y llevarlo con su receta a la farmacia.  - Tambin puede pasar por nuestra oficina durante el horario de atencin regular y recoger una tarjeta de cupones de GoodRx.  - Si necesita que su receta se enve electrnicamente a una farmacia diferente, informe a nuestra oficina a travs de MyChart de [facility] o por telfono llamando al [phone] y presione la opcin 4.  

## 2022-02-02 NOTE — Progress Notes (Signed)
   Follow-Up Visit   Subjective  Jasmine Malone is a 77 y.o. female who presents for the following: Follow-up.  Patient presents for 6 month follow-up to recheck moles. She has a history of dysplastic nevus of the spinal upper back, mild.  Patient accompanied by husband.  The following portions of the chart were reviewed this encounter and updated as appropriate:       Review of Systems:  No other skin or systemic complaints except as noted in HPI or Assessment and Plan.  Objective  Well appearing patient in no apparent distress; mood and affect are within normal limits.  A focused examination was performed including trunk, legs. Relevant physical exam findings are noted in the Assessment and Plan.  Right Lower Back right lower abdomen 5 x 4 mm speckled medium dark brown macule    Right Lower Back 5 x 2 mm dark brown macule    Left Lower Back 7 x 4 mm med brown flat pap with darker center    Left Upper Back (vs SK) 5.0 mm speckled brown papule  Left Spinal Mid Back 5.0 x 4.0 mm light brown macule, darker inferior  left lateral thigh 2 mm dark brown macule                    Spinal upper back Scar with no evidence of recurrence.     Assessment & Plan  Nevus Right Lower Back  Benign-appearing. Stable. Observation.  Call clinic for new or changing moles.  Recommend daily use of broad spectrum spf 30+ sunscreen to sun-exposed areas.   History of dysplastic nevus Spinal upper back  Clear. Observe for recurrence. Call clinic for new or changing lesions.  Recommend regular skin exams, daily broad-spectrum spf 30+ sunscreen use, and photoprotection.    Seborrheic Keratoses - Stuck-on, waxy, tan-brown papules and/or plaques  - Benign-appearing - Discussed benign etiology and prognosis. - Observe - Call for any changes  Return in about 6 months (around 08/03/2022) for TBSE, Hx DN.  IJamesetta Orleans, CMA, am acting as scribe for Brendolyn Patty, MD  .  Documentation: I have reviewed the above documentation for accuracy and completeness, and I agree with the above.  Brendolyn Patty MD

## 2022-03-10 DIAGNOSIS — N390 Urinary tract infection, site not specified: Secondary | ICD-10-CM | POA: Insufficient documentation

## 2022-08-04 ENCOUNTER — Ambulatory Visit: Payer: Medicare HMO | Admitting: Dermatology

## 2022-08-17 ENCOUNTER — Ambulatory Visit: Payer: Medicare HMO | Admitting: Dermatology

## 2023-01-28 ENCOUNTER — Other Ambulatory Visit: Payer: Self-pay | Admitting: Family Medicine

## 2023-01-28 DIAGNOSIS — Z1231 Encounter for screening mammogram for malignant neoplasm of breast: Secondary | ICD-10-CM

## 2023-02-01 NOTE — Progress Notes (Deleted)
Assessment/Plan:   ***  Subjective:   Jasmine Malone was seen today in the movement disorders clinic for neurologic consultation at the request of Ethelda Chick, MD.  The consultation is for the evaluation of tremor and memory change.  I saw the patient back in 2020/2021 for the same.  When I saw her then, very little physical exam evidence of tremor was noted.  She did have propranolol for as needed use.  We wondered if anxiety contributed some to tremor.  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  She ended up having neurocognitive testing with Dr. Milbert Coulter May 25, 2019.  Her neurocognitive testing was essentially normal and he felt that the concerns that they had were likely a result of anxiety and depression, mild sleep dysfunction.  Tremor: {yes no:314532}   How long has it been going on? ***  At rest or with activation?  ***Activation  When is it noted the most?  ***  Fam hx of tremor?  Sister with Parkinson's disease  Located where?  ***Bilateral upper extremities  Affected by caffeine:  No.  Affected by alcohol: Unknown  Affected by stress:  Yes.    Affected by fatigue:  Yes.    Spills soup if on spoon:  No.  Spills glass of liquid if full:  No.  Affects ADL's (tying shoes, brushing teeth, etc):  No.  Tremor inducing meds: Albuterol  Other Specific Symptoms:  Voice: *** Sleep: ***  Vivid Dreams:  {yes no:314532}  Acting out dreams:  {yes no:314532} Wet Pillows: {yes no:314532} Postural symptoms:  {yes no:314532}  Falls?  {yes no:314532} Bradykinesia symptoms: {parkinson brady:18041} Loss of smell:  {yes no:314532} Loss of taste:  {yes no:314532} Urinary Incontinence:  {yes no:314532} Difficulty Swallowing:  {yes no:314532} Handwriting, micrographia: {yes no:314532} Trouble with ADL's:  {yes no:314532}  Trouble buttoning clothing: {yes no:314532} Depression:  {yes no:314532} Memory changes:  {yes  no:314532} Hallucinations:  {yes no:314532}  visual distortions: {yes no:314532} N/V:  {yes no:314532} Lightheaded:  {yes no:314532}  Syncope: {yes no:314532} Diplopia:  {yes no:314532} Dyskinesia:  {yes no:314532}  Last neuroimaging of the brain was in December, 2022 and was a CT brain done at [facility].  This was unremarkable.  This was done after a fall.  It is not available for my review.  PREVIOUS MEDICATIONS: {Parkinson's RX:18200}  ALLERGIES:   Allergies  Allergen Reactions   Codeine Nausea Only    Can take Percocet with Zofran   Ibuprofen Other (See Comments)    Irritates stomach due to hiatal hernia    Shellfish Allergy Nausea Only    CURRENT MEDICATIONS:  Current Outpatient Medications  Medication Instructions   albuterol (PROVENTIL HFA;VENTOLIN HFA) 108 (90 Base) MCG/ACT inhaler 2 puffs, Inhalation, Every 6 hours PRN   FENUGREEK PO 1 capsule, Daily PRN   Glucos-MSM-C-Mn-Ginger-Willow (MSM GLUCOSAMINE COMPLEX PO) 1 capsule, Daily   levothyroxine (SYNTHROID) 25 MCG tablet Oral   Multiple Vitamin (MULTIVITAMIN WITH MINERALS) TABS tablet 1 tablet, Daily   Omega-3 Fatty Acids (FISH OIL PO) 2 capsules, Daily   ondansetron (ZOFRAN ODT) 8 mg, Oral, 2 times daily   pantoprazole (PROTONIX) 40 mg, Oral, Daily    Objective:   PHYSICAL EXAMINATION:    VITALS:  There were no vitals filed for this visit.  GEN:  The patient appears stated age and is in NAD. HEENT:  Normocephalic, atraumatic.  The mucous membranes are moist. The superficial temporal arteries  are without ropiness or tenderness. CV:  RRR Lungs:  CTAB Neck/HEME:  There are no carotid bruits bilaterally.  Neurological examination:  Orientation: The patient is alert and oriented x3.  Cranial nerves: There is good facial symmetry.  Extraocular muscles are intact. The visual fields are full to confrontational testing. The speech is fluent and clear. Soft palate rises symmetrically and there is no tongue deviation.  Hearing is intact to conversational tone. Sensation: Sensation is intact to light touch throughout (facial, trunk, extremities). Vibration is intact at the bilateral big toe. There is no extinction with double simultaneous stimulation.  Motor: Strength is 5/5 in the bilateral upper and lower extremities.   Shoulder shrug is equal and symmetric.  There is no pronator drift. Deep tendon reflexes: Deep tendon reflexes are 2/4 at the bilateral biceps, triceps, brachioradialis, patella and achilles. Plantar responses are downgoing bilaterally.  Movement examination: Tone: There is ***tone in the bilateral upper extremities.  The tone in the lower extremities is ***.  Abnormal movements: *** Coordination:  There is *** decremation with RAM's, *** Gait and Station: The patient has *** difficulty arising out of a deep-seated chair without the use of the hands. The patient's stride length is ***.  The patient has a *** pull test.     I have reviewed and interpreted the following labs independently   Chemistry      Component Value Date/Time   NA 143 10/10/2021 1405   K 3.6 10/10/2021 1405   CL 111 10/10/2021 1405   CO2 26 10/10/2021 1405   BUN 14 10/10/2021 1405   CREATININE 0.65 10/10/2021 1405   CREATININE 0.76 11/26/2015 0822      Component Value Date/Time   CALCIUM 9.1 10/10/2021 1405   ALKPHOS 74 11/26/2015 0822   AST 18 11/26/2015 0822   ALT 16 11/26/2015 0822   BILITOT 0.8 11/26/2015 0822      Lab Results  Component Value Date   TSH 2.986 04/25/2013   Lab Results  Component Value Date   WBC 7.4 10/10/2021   HGB 13.7 10/10/2021   HCT 41.6 10/10/2021   MCV 96.7 10/10/2021   PLT 185 10/10/2021      Total time spent on today's visit was ***greater than 60 minutes, including both face-to-face time and nonface-to-face time.  Time included that spent on review of records (prior notes available to me/labs/imaging if pertinent), discussing treatment and goals, answering patient's  questions and coordinating care.  Cc:  Ethelda Chick, MD

## 2023-02-09 DIAGNOSIS — I10 Essential (primary) hypertension: Secondary | ICD-10-CM | POA: Diagnosis present

## 2023-02-09 DIAGNOSIS — I214 Non-ST elevation (NSTEMI) myocardial infarction: Secondary | ICD-10-CM | POA: Insufficient documentation

## 2023-02-09 DIAGNOSIS — E785 Hyperlipidemia, unspecified: Secondary | ICD-10-CM | POA: Diagnosis present

## 2023-02-10 ENCOUNTER — Ambulatory Visit: Payer: Medicare HMO | Admitting: Neurology

## 2023-02-15 NOTE — Progress Notes (Deleted)
Assessment/Plan:   ***  Subjective:   Jasmine Malone was seen today in the movement disorders clinic for neurologic consultation at the request of Ethelda Chick, MD.  The consultation is for the evaluation of tremor and memory change.  I saw the patient back in 2020/2021 for the same.  When I saw her then, very little physical exam evidence of tremor was noted.  She did have propranolol for as needed use.  We wondered if anxiety contributed some to tremor.  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  She ended up having neurocognitive testing with Dr. Milbert Coulter May 25, 2019.  Her neurocognitive testing was essentially normal and he felt that the concerns that they had were likely a result of anxiety and depression, mild sleep dysfunction.  Separately from all of this, the patient was just admitted at [facility] on October 2 and found to have an NSTEMI.  She had a heart catheterization on October 4 showing nonobstructive coronary artery disease.  She was started on atorvastatin.  Tremor: {yes no:314532}   How long has it been going on? ***  At rest or with activation?  ***Activation  When is it noted the most?  ***  Fam hx of tremor?  Sister with Parkinson's disease  Located where?  ***Bilateral upper extremities  Affected by caffeine:  No.  Affected by alcohol: Unknown  Affected by stress:  Yes.    Affected by fatigue:  Yes.    Spills soup if on spoon:  No.  Spills glass of liquid if full:  No.  Affects ADL's (tying shoes, brushing teeth, etc):  No.  Tremor inducing meds: Albuterol  Other Specific Symptoms:  Voice: *** Sleep: ***  Vivid Dreams:  {yes no:314532}  Acting out dreams:  {yes no:314532} Wet Pillows: {yes no:314532} Postural symptoms:  {yes no:314532}  Falls?  {yes no:314532} Bradykinesia symptoms: {parkinson brady:18041} Loss of smell:  {yes no:314532} Loss of taste:  {yes no:314532} Urinary Incontinence:  {yes  no:314532} Difficulty Swallowing:  {yes no:314532} Handwriting, micrographia: {yes no:314532} Trouble with ADL's:  {yes no:314532}  Trouble buttoning clothing: {yes no:314532} Depression:  {yes no:314532} Memory changes:  {yes no:314532} Hallucinations:  {yes no:314532}  visual distortions: {yes no:314532} N/V:  {yes no:314532} Lightheaded:  {yes no:314532}  Syncope: {yes no:314532} Diplopia:  {yes no:314532} Dyskinesia:  {yes no:314532}  Last neuroimaging of the brain was in December, 2022 and was a CT brain done at [facility].  This was unremarkable.  This was done after a fall.  It is not available for my review.  PREVIOUS MEDICATIONS: {Parkinson's RX:18200}  ALLERGIES:   Allergies  Allergen Reactions   Codeine Nausea Only    Can take Percocet with Zofran   Ibuprofen Other (See Comments)    Irritates stomach due to hiatal hernia    Shellfish Allergy Nausea Only    CURRENT MEDICATIONS:  Current Outpatient Medications  Medication Instructions   albuterol (PROVENTIL HFA;VENTOLIN HFA) 108 (90 Base) MCG/ACT inhaler 2 puffs, Inhalation, Every 6 hours PRN   FENUGREEK PO 1 capsule, Daily PRN   Glucos-MSM-C-Mn-Ginger-Willow (MSM GLUCOSAMINE COMPLEX PO) 1 capsule, Daily   levothyroxine (SYNTHROID) 25 MCG tablet Oral   Multiple Vitamin (MULTIVITAMIN WITH MINERALS) TABS tablet 1 tablet, Daily   Omega-3 Fatty Acids (FISH OIL PO) 2 capsules, Daily   ondansetron (ZOFRAN ODT) 8 mg, Oral, 2 times daily   pantoprazole (PROTONIX) 40 mg, Oral, Daily    Objective:  PHYSICAL EXAMINATION:    VITALS:  There were no vitals filed for this visit.  GEN:  The patient appears stated age and is in NAD. HEENT:  Normocephalic, atraumatic.  The mucous membranes are moist. The superficial temporal arteries are without ropiness or tenderness. CV:  RRR Lungs:  CTAB Neck/HEME:  There are no carotid bruits bilaterally.  Neurological examination:  Orientation: The patient is alert and oriented x3.   Cranial nerves: There is good facial symmetry.  Extraocular muscles are intact. The visual fields are full to confrontational testing. The speech is fluent and clear. Soft palate rises symmetrically and there is no tongue deviation. Hearing is intact to conversational tone. Sensation: Sensation is intact to light touch throughout (facial, trunk, extremities). Vibration is intact at the bilateral big toe. There is no extinction with double simultaneous stimulation.  Motor: Strength is 5/5 in the bilateral upper and lower extremities.   Shoulder shrug is equal and symmetric.  There is no pronator drift. Deep tendon reflexes: Deep tendon reflexes are 2/4 at the bilateral biceps, triceps, brachioradialis, patella and achilles. Plantar responses are downgoing bilaterally.  Movement examination: Tone: There is ***tone in the bilateral upper extremities.  The tone in the lower extremities is ***.  Abnormal movements: *** Coordination:  There is *** decremation with RAM's, *** Gait and Station: The patient has *** difficulty arising out of a deep-seated chair without the use of the hands. The patient's stride length is ***.  The patient has a *** pull test.     I have reviewed and interpreted the following labs independently   Chemistry      Component Value Date/Time   NA 143 10/10/2021 1405   K 3.6 10/10/2021 1405   CL 111 10/10/2021 1405   CO2 26 10/10/2021 1405   BUN 14 10/10/2021 1405   CREATININE 0.65 10/10/2021 1405   CREATININE 0.76 11/26/2015 0822      Component Value Date/Time   CALCIUM 9.1 10/10/2021 1405   ALKPHOS 74 11/26/2015 0822   AST 18 11/26/2015 0822   ALT 16 11/26/2015 0822   BILITOT 0.8 11/26/2015 0822      Lab Results  Component Value Date   TSH 2.986 04/25/2013   Lab Results  Component Value Date   WBC 7.4 10/10/2021   HGB 13.7 10/10/2021   HCT 41.6 10/10/2021   MCV 96.7 10/10/2021   PLT 185 10/10/2021      Total time spent on today's visit was ***greater  than 60 minutes, including both face-to-face time and nonface-to-face time.  Time included that spent on review of records (prior notes available to me/labs/imaging if pertinent), discussing treatment and goals, answering patient's questions and coordinating care.  Cc:  Ethelda Chick, MD

## 2023-02-17 ENCOUNTER — Emergency Department: Payer: Medicare HMO

## 2023-02-17 ENCOUNTER — Other Ambulatory Visit: Payer: Self-pay

## 2023-02-17 ENCOUNTER — Inpatient Hospital Stay: Payer: Medicare HMO

## 2023-02-17 ENCOUNTER — Inpatient Hospital Stay
Admission: EM | Admit: 2023-02-17 | Discharge: 2023-02-20 | DRG: 070 | Disposition: A | Discharge status: Home Health | Payer: Medicare HMO | Attending: Internal Medicine | Admitting: Internal Medicine

## 2023-02-17 ENCOUNTER — Ambulatory Visit: Payer: Medicare HMO | Admitting: Neurology

## 2023-02-17 DIAGNOSIS — C73 Malignant neoplasm of thyroid gland: Secondary | ICD-10-CM | POA: Diagnosis present

## 2023-02-17 DIAGNOSIS — E890 Postprocedural hypothyroidism: Secondary | ICD-10-CM | POA: Diagnosis present

## 2023-02-17 DIAGNOSIS — Z9851 Tubal ligation status: Secondary | ICD-10-CM

## 2023-02-17 DIAGNOSIS — E669 Obesity, unspecified: Secondary | ICD-10-CM | POA: Insufficient documentation

## 2023-02-17 DIAGNOSIS — F411 Generalized anxiety disorder: Secondary | ICD-10-CM | POA: Diagnosis present

## 2023-02-17 DIAGNOSIS — I252 Old myocardial infarction: Secondary | ICD-10-CM

## 2023-02-17 DIAGNOSIS — Z888 Allergy status to other drugs, medicaments and biological substances status: Secondary | ICD-10-CM

## 2023-02-17 DIAGNOSIS — G9341 Metabolic encephalopathy: Secondary | ICD-10-CM | POA: Diagnosis present

## 2023-02-17 DIAGNOSIS — Z1152 Encounter for screening for COVID-19: Secondary | ICD-10-CM

## 2023-02-17 DIAGNOSIS — I214 Non-ST elevation (NSTEMI) myocardial infarction: Secondary | ICD-10-CM | POA: Diagnosis present

## 2023-02-17 DIAGNOSIS — R413 Other amnesia: Secondary | ICD-10-CM | POA: Diagnosis present

## 2023-02-17 DIAGNOSIS — I10 Essential (primary) hypertension: Secondary | ICD-10-CM | POA: Diagnosis not present

## 2023-02-17 DIAGNOSIS — R4182 Altered mental status, unspecified: Secondary | ICD-10-CM | POA: Diagnosis present

## 2023-02-17 DIAGNOSIS — E876 Hypokalemia: Secondary | ICD-10-CM | POA: Diagnosis present

## 2023-02-17 DIAGNOSIS — K219 Gastro-esophageal reflux disease without esophagitis: Secondary | ICD-10-CM | POA: Diagnosis present

## 2023-02-17 DIAGNOSIS — I5031 Acute diastolic (congestive) heart failure: Secondary | ICD-10-CM

## 2023-02-17 DIAGNOSIS — Z79899 Other long term (current) drug therapy: Secondary | ICD-10-CM

## 2023-02-17 DIAGNOSIS — Z6837 Body mass index (BMI) 37.0-37.9, adult: Secondary | ICD-10-CM

## 2023-02-17 DIAGNOSIS — R531 Weakness: Secondary | ICD-10-CM | POA: Diagnosis present

## 2023-02-17 DIAGNOSIS — Z818 Family history of other mental and behavioral disorders: Secondary | ICD-10-CM

## 2023-02-17 DIAGNOSIS — I2510 Atherosclerotic heart disease of native coronary artery without angina pectoris: Secondary | ICD-10-CM | POA: Diagnosis present

## 2023-02-17 DIAGNOSIS — F3289 Other specified depressive episodes: Secondary | ICD-10-CM | POA: Diagnosis not present

## 2023-02-17 DIAGNOSIS — R262 Difficulty in walking, not elsewhere classified: Secondary | ICD-10-CM | POA: Diagnosis present

## 2023-02-17 DIAGNOSIS — Z79890 Hormone replacement therapy (postmenopausal): Secondary | ICD-10-CM

## 2023-02-17 DIAGNOSIS — Z9842 Cataract extraction status, left eye: Secondary | ICD-10-CM

## 2023-02-17 DIAGNOSIS — Z85828 Personal history of other malignant neoplasm of skin: Secondary | ICD-10-CM

## 2023-02-17 DIAGNOSIS — Z85850 Personal history of malignant neoplasm of thyroid: Secondary | ICD-10-CM

## 2023-02-17 DIAGNOSIS — F32A Depression, unspecified: Secondary | ICD-10-CM | POA: Diagnosis present

## 2023-02-17 DIAGNOSIS — R569 Unspecified convulsions: Secondary | ICD-10-CM | POA: Diagnosis not present

## 2023-02-17 DIAGNOSIS — J45909 Unspecified asthma, uncomplicated: Secondary | ICD-10-CM | POA: Diagnosis present

## 2023-02-17 DIAGNOSIS — Z885 Allergy status to narcotic agent status: Secondary | ICD-10-CM

## 2023-02-17 DIAGNOSIS — E785 Hyperlipidemia, unspecified: Secondary | ICD-10-CM | POA: Diagnosis present

## 2023-02-17 DIAGNOSIS — F4310 Post-traumatic stress disorder, unspecified: Secondary | ICD-10-CM | POA: Diagnosis present

## 2023-02-17 DIAGNOSIS — Z91013 Allergy to seafood: Secondary | ICD-10-CM

## 2023-02-17 DIAGNOSIS — Z9049 Acquired absence of other specified parts of digestive tract: Secondary | ICD-10-CM

## 2023-02-17 DIAGNOSIS — I110 Hypertensive heart disease with heart failure: Secondary | ICD-10-CM | POA: Diagnosis present

## 2023-02-17 DIAGNOSIS — Z9841 Cataract extraction status, right eye: Secondary | ICD-10-CM

## 2023-02-17 LAB — CBC
HCT: 41.8 % (ref 36.0–46.0)
Hemoglobin: 13.8 g/dL (ref 12.0–15.0)
MCH: 32.3 pg (ref 26.0–34.0)
MCHC: 33 g/dL (ref 30.0–36.0)
MCV: 97.9 fL (ref 80.0–100.0)
Platelets: 115 10*3/uL — ABNORMAL LOW (ref 150–400)
RBC: 4.27 MIL/uL (ref 3.87–5.11)
RDW: 14.3 % (ref 11.5–15.5)
WBC: 7.5 10*3/uL (ref 4.0–10.5)
nRBC: 0 % (ref 0.0–0.2)

## 2023-02-17 LAB — COMPREHENSIVE METABOLIC PANEL
ALT: 21 U/L (ref 0–44)
AST: 34 U/L (ref 15–41)
Albumin: 3.2 g/dL — ABNORMAL LOW (ref 3.5–5.0)
Alkaline Phosphatase: 123 U/L (ref 38–126)
Anion gap: 10 (ref 5–15)
BUN: 14 mg/dL (ref 8–23)
CO2: 22 mmol/L (ref 22–32)
Calcium: 8.7 mg/dL — ABNORMAL LOW (ref 8.9–10.3)
Chloride: 105 mmol/L (ref 98–111)
Creatinine, Ser: 0.6 mg/dL (ref 0.44–1.00)
GFR, Estimated: >60 mL/min (ref >60)
Glucose, Bld: 108 mg/dL — ABNORMAL HIGH (ref 70–99)
Potassium: 3.5 mmol/L (ref 3.5–5.1)
Sodium: 137 mmol/L (ref 135–145)
Total Bilirubin: 1.2 mg/dL (ref 0.3–1.2)
Total Protein: 6.7 g/dL (ref 6.5–8.1)

## 2023-02-17 LAB — URINALYSIS, ROUTINE W REFLEX MICROSCOPIC
Bilirubin Urine: NEGATIVE
Glucose, UA: NEGATIVE mg/dL
Ketones, ur: NEGATIVE mg/dL
Leukocytes,Ua: NEGATIVE
Nitrite: NEGATIVE
Protein, ur: NEGATIVE mg/dL
Specific Gravity, Urine: 1.005 (ref 1.005–1.030)
pH: 8 (ref 5.0–8.0)

## 2023-02-17 LAB — DIFFERENTIAL
Abs Immature Granulocytes: 0.02 10*3/uL (ref 0.00–0.07)
Basophils Absolute: 0.1 10*3/uL (ref 0.0–0.1)
Basophils Relative: 1 %
Eosinophils Absolute: 0.1 10*3/uL (ref 0.0–0.5)
Eosinophils Relative: 1 %
Immature Granulocytes: 0 %
Lymphocytes Relative: 31 %
Lymphs Abs: 2.3 10*3/uL (ref 0.7–4.0)
Monocytes Absolute: 0.7 10*3/uL (ref 0.1–1.0)
Monocytes Relative: 10 %
Neutro Abs: 4.3 10*3/uL (ref 1.7–7.7)
Neutrophils Relative %: 57 %

## 2023-02-17 LAB — APTT: aPTT: 23 s — ABNORMAL LOW (ref 24–36)

## 2023-02-17 LAB — BLOOD GAS, VENOUS
Acid-Base Excess: 7 mmol/L — ABNORMAL HIGH (ref 0.0–2.0)
Bicarbonate: 30.2 mmol/L — ABNORMAL HIGH (ref 20.0–28.0)
O2 Saturation: 92.7 %
Patient temperature: 37
pCO2, Ven: 37 mm[Hg] — ABNORMAL LOW (ref 44–60)
pH, Ven: 7.52 — ABNORMAL HIGH (ref 7.25–7.43)
pO2, Ven: 64 mm[Hg] — ABNORMAL HIGH (ref 32–45)

## 2023-02-17 LAB — TSH: TSH: 2.099 u[IU]/mL (ref 0.350–4.500)

## 2023-02-17 LAB — RESP PANEL BY RT-PCR (RSV, FLU A&B, COVID)  RVPGX2
Influenza A by PCR: NEGATIVE
Influenza B by PCR: NEGATIVE
Resp Syncytial Virus by PCR: NEGATIVE
SARS Coronavirus 2 by RT PCR: NEGATIVE

## 2023-02-17 LAB — PROTIME-INR
INR: 1.2 (ref 0.8–1.2)
Prothrombin Time: 15.3 s — ABNORMAL HIGH (ref 11.4–15.2)

## 2023-02-17 LAB — BRAIN NATRIURETIC PEPTIDE: B Natriuretic Peptide: 163.7 pg/mL — ABNORMAL HIGH (ref 0.0–100.0)

## 2023-02-17 LAB — ETHANOL: Alcohol, Ethyl (B): <10 mg/dL (ref <10)

## 2023-02-17 LAB — CBG MONITORING, ED: Glucose-Capillary: 127 mg/dL — ABNORMAL HIGH (ref 70–99)

## 2023-02-17 MED ORDER — ENOXAPARIN SODIUM 40 MG/0.4ML IJ SOSY
40.0000 mg | PREFILLED_SYRINGE | INTRAMUSCULAR | Status: DC
  Administered 2023-02-17 – 2023-02-19 (×3): 40 mg via SUBCUTANEOUS
  Filled 2023-02-17 (×3): qty 0.4

## 2023-02-17 MED ORDER — FLUOXETINE HCL 20 MG PO TABS
20.0000 mg | ORAL_TABLET | Freq: Every day | ORAL | Status: DC
  Filled 2023-02-17: qty 1

## 2023-02-17 MED ORDER — ONDANSETRON HCL 4 MG/2ML IJ SOLN: 4.0000 mg | Freq: Four times a day (QID) | INTRAMUSCULAR | Status: DC | PRN

## 2023-02-17 MED ORDER — LEVOTHYROXINE SODIUM 88 MCG PO TABS
88.0000 ug | ORAL_TABLET | Freq: Every day | ORAL | Status: DC
  Administered 2023-02-19 – 2023-02-20 (×2): 88 ug via ORAL
  Filled 2023-02-17 (×3): qty 1

## 2023-02-17 MED ORDER — ONDANSETRON HCL 4 MG PO TABS: 4.0000 mg | ORAL_TABLET | Freq: Four times a day (QID) | ORAL | Status: DC | PRN

## 2023-02-17 MED ORDER — ACETAMINOPHEN 325 MG PO TABS
650.0000 mg | ORAL_TABLET | Freq: Four times a day (QID) | ORAL | Status: DC | PRN
  Administered 2023-02-19: 650 mg via ORAL
  Filled 2023-02-17: qty 2

## 2023-02-17 MED ORDER — ACETAMINOPHEN 650 MG RE SUPP: 650.0000 mg | Freq: Four times a day (QID) | RECTAL | Status: DC | PRN

## 2023-02-17 MED ORDER — ATORVASTATIN CALCIUM 20 MG PO TABS
20.0000 mg | ORAL_TABLET | Freq: Every day | ORAL | Status: DC
  Administered 2023-02-18 – 2023-02-19 (×2): 20 mg via ORAL
  Filled 2023-02-17 (×3): qty 1

## 2023-02-17 MED ORDER — ALBUTEROL SULFATE (2.5 MG/3ML) 0.083% IN NEBU: 3.0000 mL | INHALATION_SOLUTION | Freq: Four times a day (QID) | RESPIRATORY_TRACT | Status: DC | PRN

## 2023-02-17 MED ORDER — SODIUM CHLORIDE 0.9% FLUSH
3.0000 mL | Freq: Two times a day (BID) | INTRAVENOUS | Status: DC
  Administered 2023-02-17 – 2023-02-19 (×4): 3 mL via INTRAVENOUS

## 2023-02-17 MED ORDER — FUROSEMIDE 10 MG/ML IJ SOLN
20.0000 mg | Freq: Once | INTRAMUSCULAR | Status: AC
  Administered 2023-02-17: 20 mg via INTRAVENOUS
  Filled 2023-02-17: qty 2

## 2023-02-17 MED ORDER — LORAZEPAM 2 MG/ML IJ SOLN
0.5000 mg | Freq: Once | INTRAMUSCULAR | Status: AC | PRN
  Administered 2023-02-17: 0.5 mg via INTRAVENOUS
  Filled 2023-02-17: qty 1

## 2023-02-17 NOTE — Assessment & Plan Note (Deleted)
Patient is presenting with 4-5 day history of altered mental status including difficulty responding to questions, confusion, shuffling gait and dysmetria.  Some of the symptoms were chronic and present previously but dramatic change noted after recent hospitalization where patient had a left heart cath.  Differential is broad at this time but includes CVA vs underlying infection (apparently previously on suppressive antibiotics for UTI but patient ran out).  - Telemetry monitoring - MRI of the brain without contrast - Urinalysis pending - Hold off on empiric antibiotics - VBG - TSH - Swallow study - PT/OT/SLP

## 2023-02-17 NOTE — ED Triage Notes (Addendum)
First nurse note: from [facility] for AMS started this am. Husband reports LKW Friday morning when d/c from hospital for heart attack

## 2023-02-17 NOTE — H&P (Signed)
History and Physical    Patient: Jasmine Malone DOB: 02/02/1946 DOA: 02/17/2023 DOS: the patient was seen and examined on 02/17/2023 PCP: Jasmine Chick, MD  Patient coming from: Home  Chief Complaint:  Chief Complaint  Patient presents with   Altered Mental Status   HPI: Jasmine Malone is a 77 y.o. 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   History obtained from patient's husband and daughter at bedside given patient's altered mental status.  Family states that over the last year, Jasmine Malone has had a gradual decline with shuffling gait and occasional difficulty finding her words but this has all seem to improve significantly after she started physical therapy.  Then over the last 4 days approximately, she has began to experience worsening shuffling gait, changes in her hand eye coordination and significant worsening inability to answer questions appropriately.  She seemed confused at times.  Then this morning, she was unable to follow any commands or answer any questions appropriately.  Due to this, they brought her to the ED.  Jasmine Malone states that last week, patient was experiencing a cough but he has not noticed any significant cough for the last few days.  He denies noticing any fever, chills, nausea, vomiting, diarrhea, abdominal pain.  He notes that patient has experienced confusion in the past when experiencing a UTI and she was on chronic suppressive antibiotics but believes she ran out of these.  He is uncertain if she has been experiencing any urinary symptoms.  ED Course:  On arrival to the ED, patient was hypertensive at 166/72 with heart rate of 74.  She was saturating at 100% on room air.  She was  afebrile at 98.2. Initial workup notable for platelets of 115, glucose 108, calcium 8.7, albumin 3.2.  Renal function intact with creatinine 0.60 with GFR above 60.  COVID-19, RSV and influenza PCR negative.  CT head was obtained with no acute intracranial abnormality.  Chest x-ray was obtained with evidence of vascular congestion.  Due to need for further workup, [facility] contacted for admission.    Review of Systems: unable to review all systems due to the inability of the patient to answer questions.  Past Medical History:  Diagnosis Date   Arthritis    Asthma    Albuterol use prior to exercise.  No hospitalizations or ED visits.   Cancer    Squamous cell cancer   Cataracts, bilateral 05/10/2012   s/p B cataract resection.   Dysplastic nevus 07/22/2021   spinal upper back, mild   Generalized anxiety disorder    GERD (gastroesophageal reflux disease)    H. pylori infection    Heart murmur    Hepatitis    Type A as a child   Hiatal hernia    Hyperlipidemia    Major depressive disorder    Followed by therapist once per month; Nan Hyball in [facility].   Parathyroid abnormality    PONV (postoperative nausea and vomiting)    Post traumatic stress disorder (PTSD)    Tremor    Past Surgical History:  Procedure Laterality Date   CATARACT EXTRACTION, BILATERAL     DILATATION & CURETTAGE/HYSTEROSCOPY WITH TRUECLEAR N/A 02/20/2014   Procedure: DILATATION & CURETTAGE/HYSTEROSCOPY WITH TRUCLEAR;  Surgeon: Meriel Pica, MD;  Location: [facility];  Service: Gynecology;  Laterality: N/A;   LAPAROSCOPIC CHOLECYSTECTOMY  1998   PARATHYROIDECTOMY  1997   3 resected.     RETINAL DETACHMENT SURGERY Right 2012   RETINAL DETACHMENT SURGERY Left    x 4   TUBAL LIGATION  1993   Social History:  reports that she has never smoked. She has never used smokeless tobacco. She reports current alcohol use. She reports that she does not use drugs.  Allergies  Allergen Reactions   Codeine Nausea Only    Can take  Percocet with Zofran   Ibuprofen Other (See Comments)    Irritates stomach due to hiatal hernia    Shellfish Allergy Nausea Only    Family History  Problem Relation Age of Onset   Stroke Mother 41       CVA; tobacco abuse.   Mental illness Mother    Alcohol abuse Mother    Diabetes Father    Cancer Father        brain cancer/malignancy   Mental illness Father    Diabetes Brother    Kidney failure Brother    Diabetes Sister    Depression Sister    Cancer Sister    Colon cancer Neg Hx     Prior to Admission medications   Medication Sig Start Date End Date Taking? Authorizing Provider  albuterol (PROVENTIL HFA;VENTOLIN HFA) 108 (90 Base) MCG/ACT inhaler Inhale 2 puffs into the lungs every 6 (six) hours as needed for wheezing or shortness of breath (cough, shortness of breath or wheezing.). 04/07/16   Bing Neighbors, NP  FENUGREEK PO Take 1 capsule by mouth daily as needed (for congestion).    [provider]  Glucos-MSM-C-Mn-Ginger-Willow (MSM GLUCOSAMINE COMPLEX PO) Take 1 capsule by mouth daily.     [provider]  levothyroxine (SYNTHROID) 25 MCG tablet Take by mouth. 01/25/22   [provider]  Multiple Vitamin (MULTIVITAMIN WITH MINERALS) TABS tablet Take 1 tablet by mouth daily.    [provider]  Omega-3 Fatty Acids (FISH OIL PO) Take 2 capsules by mouth daily.     [provider]  ondansetron (ZOFRAN ODT) 8 MG disintegrating tablet Take 1 tablet (8 mg total) by mouth 2 (two) times daily. Patient not taking: Reported on 09/16/2020 01/12/18   Ovid Curd P, PA-C  pantoprazole (PROTONIX) 40 MG tablet TAKE 1 TABLET (40 MG TOTAL) BY MOUTH DAILY. 12/13/16   PyrtleCarie Caddy, MD    Physical Exam: Vitals:   02/17/23 1145 02/17/23 1146  BP: (!) 166/72   Pulse: 74   Resp: 14   Temp: 98.2 F (36.8 C)   TempSrc: Oral   SpO2: 100%   Weight:  99.8 kg  Height:  5\' 6"  (1.676 m)   Physical Exam Vitals and nursing note reviewed.   Constitutional:      General: She is not in acute distress.    Appearance: She is obese.  HENT:     Head: Normocephalic and atraumatic.  Eyes:     Comments: Left pupil smaller than right, chronic and unchanged.  Unable to follow commands to evaluate extraocular movements.  Cardiovascular:     Rate and Rhythm: Normal rate and regular rhythm.     Heart sounds: Murmur (2/6 decrescendo systolic murmur) heard.     No gallop.  Pulmonary:     Effort: Pulmonary effort is normal. No respiratory distress.     Comments: Difficult to auscultate, but diminished in the bases Abdominal:     General: Bowel sounds are  normal. There is no distension.     Palpations: Abdomen is soft.     Tenderness: There is no abdominal tenderness.  Musculoskeletal:     Comments: +1 bilateral pitting edema  Skin:    General: Skin is warm and dry.     Findings: Bruising (Bilateral upper extremity bruising in various stages of healing) present.  Neurological:     Mental Status: She is alert.     Comments:  Patient is alert but not oriented to situation, place, time.  She is able to identify of her family correctly in the room  No dysarthria or facial asymmetry  5 out of 5 bilateral upper extremity strength 4/5 bilateral lower extremity strength, although right is very subtly weaker than the left  On initial attempt, Patient was able to complete finger-to-nose on the right only and demonstrated significant dysmetria.  She was then no longer able to complete despite repeated instruction Gait not tested  Psychiatric:        Mood and Affect: Mood normal.    Data Reviewed: CBC with WBC of 7.5, hemoglobin of 13.8, MCV of 97.9, platelets 150 CMP with sodium of 137, potassium 3.2, bicarb 22, glucose 108, BUN 14, creatinine 0.60, albumin 3.2, AST 34, ALT 21 and GFR above 60  INR 1.2 and PTT of 23  COVID-19, influenza and RSV PCR negative  Alcohol level negative  DG Chest 2 View  Result Date:  02/17/2023 CLINICAL DATA:  Weakness EXAM: CHEST - 2 VIEW COMPARISON:  None Available. FINDINGS: Enlarged cardiopericardial silhouette. Vascular congestion. No pneumothorax or effusion. Overlapping cardiac leads. Degenerative changes along the spine. Films are under penetrated. IMPRESSION: Enlarged cardiopericardial silhouette with some vascular congestion. No pneumothorax or effusion. Overlapping cardiac leads. Electronically Signed   By: Karen Kays M.D.   On: 02/17/2023 15:00   CT HEAD WO CONTRAST  Result Date: 02/17/2023 CLINICAL DATA:  Neuro deficit, acute, stroke suspected EXAM: CT HEAD WITHOUT CONTRAST TECHNIQUE: Contiguous axial images were obtained from the base of the skull through the vertex without intravenous contrast. RADIATION DOSE REDUCTION: This exam was performed according to the departmental dose-optimization program which includes automated exposure control, adjustment of the mA and/or kV according to patient size and/or use of iterative reconstruction technique. COMPARISON:  None Available. FINDINGS: Brain: No evidence of acute infarction, hemorrhage, hydrocephalus, extra-axial collection or mass lesion/mass effect. Patchy white matter hypodensities are nonspecific but compatible with chronic microvascular ischemic disease. Vascular: No hyperdense vessel identified. Skull: No acute fracture. Sinuses/Orbits: No acute finding. Other: No mastoid effusions. IMPRESSION: 1. No evidence of large vascular territory infarct or acute hemorrhage. An MRI could provide more sensitive evaluation for acute infarct if clinically warranted. 2. Chronic microvascular ischemic change. Electronically Signed   By: Feliberto Harts M.D.   On: 02/17/2023 14:59    Recent LHC on 02/11/2023 at OSH:  Coronary Angiography  Dominance: Right   Left Main: The left main coronary artery (LMCA) is a large-caliber vessel  that originates from the left coronary sinus. It bifurcates into the left  anterior descending  (LAD) and left circumflex (LCx) arteries. There is no  angiographic evidence of significant disease in the LMCA.   LAD: The LAD is a large-caliber vessel that gives off 3 Nellums diagonal (D)  branches before it wraps around the apex. Prox LAD has focal 30% stenosis.  Distal vessel has mild diffuse disease. Quinter diagonals have mild diffuse  disease.   Left Circumflex: The LCx is a large-caliber vessel that gives  off two  obtuse marginal (OM) branches and then continues as a Reinhardt vessel in the  AV groove. There are mild luminal irregularities in the Lcx and distal  OMs.   Right Coronary: The right coronary artery (RCA) is a large-caliber vessel  originating from the right coronary sinus. It bifurcates distally into a  Gingrich-caliber posterior descending artery (PDA) and moderate  posterolateral (PL) branch consistent with a right dominant system. There  are mild luminal irregularities in the RCA.   Recent TTE on 02/10/2023 at OSH:  Summary   1. The left ventricle is mildly dilated in size with normal wall thickness.    2. The left ventricular systolic function is normal with no obvious regional  wall motion abnormalities, LVEF is visually estimated at > 55%.    3. Mitral annular calcification is present (moderate).    4. There is mild aortic regurgitation.    5. The right ventricle is upper normal in size, with normal systolic  function.   6. IVC size and inspiratory change suggest elevated right atrial pressure.  (10-20 mmHg).   Results are pending, will review when available.  Assessment and Plan:  * Altered mental status Patient is presenting with 4-5 day history of altered mental status including difficulty responding to questions, confusion, shuffling gait and dysmetria.  Some of the symptoms were chronic and present previously but dramatic change noted after recent hospitalization where patient had a left heart cath.  Differential is broad at this time but includes CVA vs  underlying infection (apparently previously on suppressive antibiotics for UTI but patient ran out).  - Telemetry monitoring - MRI of the brain without contrast - Urinalysis pending - Hold off on empiric antibiotics - VBG - TSH - Swallow study - PT/OT/SLP  Acute heart failure with preserved ejection fraction (HFpEF) (HCC) On most recent admission last week for NSTEMI, echocardiogram demonstrated increased IVC size and respiratory changes consistent with elevated RA pressure.  LVEF preserved with EF above 55% but grade 1 DD noted.  Vascular congestion seen on chest x-ray today concerning for acute exacerbation with +1 pitting edema seen on examination.  She received Lasix during recent hospitalization but was not discharged on diuretics.  - Telemetry monitoring - Lasix 20 mg IV once - BNP pending - Strict in and out - Daily weights  Non-obstructive CAD LHC on 10/0/2024 demonstrated mild nonobstructive CAD.  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  She is on levothyroxine and her dose was recently increased from 75 mcg to 88 mcg.  Transfer text  - TSH pending - Continue outpatient follow-up with endocrinology  HTN (hypertension) - Resume home antihypertensive regimen when able to swallow reliably  Advance Care Planning:   Code Status: Full Code verified by patient's husband, daughter and patient agreed.  She has a living will signed but when we discussed a short-term resuscitative effort, patient's family states they would like full resuscitative efforts.  Consults: None  Family Communication: Patient's husband and daughter updated at bedside  Severity of Illness: The appropriate patient status for this patient is INPATIENT. Inpatient status is judged to be reasonable and necessary in order to provide  the required intensity of service to ensure the patient's safety. The patient's presenting symptoms, physical exam findings, and initial radiographic and laboratory data in the context of their chronic comorbidities is felt to place them  at high risk for further clinical deterioration. Furthermore, it is not anticipated that the patient will be medically stable for discharge from the hospital within 2 midnights of admission.   * I certify that at the point of admission it is my clinical judgment that the patient will require inpatient hospital care spanning beyond 2 midnights from the point of admission due to high intensity of service, high risk for further deterioration and high frequency of surveillance required.*  Author: Verdene Lennert, MD 02/17/2023 4:51 PM  For on call review [email or web address].

## 2023-02-17 NOTE — Assessment & Plan Note (Addendum)
EF greater than 55% on echocardiogram at [facility].  Continue to monitor off medication.

## 2023-02-17 NOTE — ED Notes (Signed)
Pt to CT

## 2023-02-17 NOTE — ED Triage Notes (Signed)
Pt to ed from [facility] with husband. Pt advised pt has been like this since Friday. Pt was at [facility] last week for cardiac cath. Pt is alert but unable to answer my questions. Pt missed her neurology apt this morning as they were 15 minutes late and they refused to see her and sent her here. Husband is caregiver at home. Pt can walk when at home

## 2023-02-17 NOTE — ED Provider Notes (Signed)
[facility] Provider Note    Event Date/Time   First MD Initiated Contact with Patient 02/17/23 1157     (approximate)   History   Chief Complaint Altered Mental Status   HPI  Jasmine Malone is a 77 y.o. female with past medical history of hypertension, hyperlipidemia, CAD, and hypothyroidism who presents to the ED for altered mental status.  Husband at bedside reports that patient has been increasingly weak over the past couple of days with difficulty walking.  During this time, she has had difficulty getting words out and has been noted that she has been more confused since this morning.  He states that she has been coughing recently and complained of chills, but he is not aware of any fevers, chest pain, shortness of breath, nausea, vomiting, diarrhea, or dysuria.  Patient was admitted to [facility] last week for NSTEMI, had catheterization showing nonobstructive CAD.     Physical Exam   Triage Vital Signs: ED Triage Vitals  Encounter Vitals Group     BP 02/17/23 1145 (!) 166/72     Systolic BP Percentile --      Diastolic BP Percentile --      Pulse Rate 02/17/23 1145 74     Resp 02/17/23 1145 14     Temp 02/17/23 1145 98.2 F (36.8 C)     Temp Source 02/17/23 1145 Oral     SpO2 02/17/23 1145 100 %     Weight 02/17/23 1146 220 lb (99.8 kg)     Height 02/17/23 1146 5\' 6"  (1.676 m)     Head Circumference --      Peak Flow --      Pain Score 02/17/23 1145 0     Pain Loc --      Pain Education --      Exclude from Growth Chart --     Most recent vital signs: Vitals:   02/17/23 1145  BP: (!) 166/72  Pulse: 74  Resp: 14  Temp: 98.2 F (36.8 C)  SpO2: 100%    Constitutional: Alert and oriented to person, place, and time but not situation. Eyes: Conjunctivae are normal. Head: Atraumatic. Nose: No congestion/rhinnorhea. Mouth/Throat: Mucous membranes are moist.  Cardiovascular: Normal rate, regular rhythm. Grossly normal heart  sounds.  2+ radial pulses bilaterally. Respiratory: Normal respiratory effort.  No retractions. Lungs CTAB. Gastrointestinal: Soft and nontender. No distention. Musculoskeletal: No lower extremity tenderness nor edema.  Neurologic:  Normal speech and language. No gross focal neurologic deficits are appreciated.    ED Results / Procedures / Treatments   Labs (all labs ordered are listed, but only abnormal results are displayed) Labs Reviewed  PROTIME-INR - Abnormal; Notable for the following components:      Result Value   Prothrombin Time 15.3 (*)    All other components within normal limits  APTT - Abnormal; Notable for the following components:   aPTT 23 (*)    All other components within normal limits  COMPREHENSIVE METABOLIC PANEL - Abnormal; Notable for the following components:   Glucose, Bld 108 (*)    Calcium 8.7 (*)    Albumin 3.2 (*)    All other components within normal limits  CBC - Abnormal; Notable for the following components:   Platelets 115 (*)    All other components within normal limits  CBG MONITORING, ED - Abnormal; Notable for the following components:   Glucose-Capillary 127 (*)    All other components within normal limits  RESP PANEL BY RT-PCR (RSV, FLU A&B, COVID)  [id number]  ETHANOL  DIFFERENTIAL  URINALYSIS, ROUTINE W REFLEX MICROSCOPIC  TROPONIN I (HIGH SENSITIVITY)  TROPONIN I (HIGH SENSITIVITY)     EKG  ED ECG REPORT I, Chesley Noon, the attending physician, personally viewed and interpreted this ECG.   Date: 02/17/2023  EKG Time: 11:51  Rate: 68  Rhythm: normal sinus rhythm  Axis: Normal  Intervals:first-degree A-V block  and prolonged QT  ST&T Change: None  RADIOLOGY CT head reviewed and interpreted by me with no hemorrhage or midline shift.  PROCEDURES:  Critical Care performed: No  Procedures   MEDICATIONS ORDERED IN ED: Medications - No data to display   IMPRESSION / MDM / ASSESSMENT AND PLAN / ED COURSE  I reviewed  the triage vital signs and the nursing notes.                              77 y.o. female with past medical history of hypertension, hyperlipidemia, hypothyroidism, and CAD who presents to the ED complaining of increasing weakness, confusion, and difficulty speaking with a cough over the past 3 days.  Patient's presentation is most consistent with acute presentation with potential threat to life or bodily function.  Differential diagnosis includes, but is not limited to, stroke, TIA, anemia, electrolyte abnormality, AKI, pneumonia, viral illness, UTI.  Patient nontoxic-appearing and in no acute distress, vital signs are unremarkable.  EKG shows no evidence of arrhythmia or ischemia, patient appears globally weak with a nonfocal neurologic exam.  She is slightly disoriented, will further assess for stroke with CT of her head.  Labs are pending at this time, will also check chest x-ray and urinalysis for infectious process.  CT head is negative for acute process, labs are reassuring with no significant anemia, leukocytosis, tract abnormality, or AKI.  LFTs are unremarkable and COVID testing is negative.  Chest x-ray shows some mild pulmonary edema but patient with no respiratory symptoms at this time.  Urinalysis pending, but given significant change in mental status and generalized weakness, case discussed with hospitalist for admission.      FINAL CLINICAL IMPRESSION(S) / ED DIAGNOSES   Final diagnoses:  Altered mental status, unspecified altered mental status type  Generalized weakness     Rx / DC Orders   ED Discharge Orders     None        Note:  This document was prepared using Dragon voice recognition software and may include unintentional dictation errors.   Chesley Noon, MD 02/17/23 1534

## 2023-02-17 NOTE — Assessment & Plan Note (Addendum)
TSH normal range continue Synthroid

## 2023-02-17 NOTE — Assessment & Plan Note (Addendum)
Patient takes olmesartan at home and was on Avapro here.  Can go back on olmesartan at home.

## 2023-02-17 NOTE — Assessment & Plan Note (Addendum)
LHC on 10/0/2024 demonstrated mild nonobstructive CAD.

## 2023-02-18 ENCOUNTER — Ambulatory Visit: Payer: Medicare HMO

## 2023-02-18 DIAGNOSIS — R569 Unspecified convulsions: Secondary | ICD-10-CM

## 2023-02-18 DIAGNOSIS — E669 Obesity, unspecified: Secondary | ICD-10-CM | POA: Insufficient documentation

## 2023-02-18 DIAGNOSIS — G9341 Metabolic encephalopathy: Secondary | ICD-10-CM

## 2023-02-18 DIAGNOSIS — R4182 Altered mental status, unspecified: Secondary | ICD-10-CM | POA: Diagnosis not present

## 2023-02-18 DIAGNOSIS — I2510 Atherosclerotic heart disease of native coronary artery without angina pectoris: Secondary | ICD-10-CM | POA: Diagnosis not present

## 2023-02-18 DIAGNOSIS — F3289 Other specified depressive episodes: Secondary | ICD-10-CM

## 2023-02-18 DIAGNOSIS — E785 Hyperlipidemia, unspecified: Secondary | ICD-10-CM

## 2023-02-18 DIAGNOSIS — I5031 Acute diastolic (congestive) heart failure: Secondary | ICD-10-CM | POA: Diagnosis not present

## 2023-02-18 DIAGNOSIS — R531 Weakness: Secondary | ICD-10-CM

## 2023-02-18 LAB — BASIC METABOLIC PANEL
Anion gap: 13 (ref 5–15)
BUN: 12 mg/dL (ref 8–23)
CO2: 26 mmol/L (ref 22–32)
Calcium: 8.9 mg/dL (ref 8.9–10.3)
Chloride: 104 mmol/L (ref 98–111)
Creatinine, Ser: 0.58 mg/dL (ref 0.44–1.00)
GFR, Estimated: >60 mL/min (ref >60)
Glucose, Bld: 112 mg/dL — ABNORMAL HIGH (ref 70–99)
Potassium: 3.4 mmol/L — ABNORMAL LOW (ref 3.5–5.1)
Sodium: 143 mmol/L (ref 135–145)

## 2023-02-18 LAB — CBC
HCT: 40.8 % (ref 36.0–46.0)
Hemoglobin: 14.2 g/dL (ref 12.0–15.0)
MCH: 32.3 pg (ref 26.0–34.0)
MCHC: 34.8 g/dL (ref 30.0–36.0)
MCV: 92.7 fL (ref 80.0–100.0)
Platelets: 168 10*3/uL (ref 150–400)
RBC: 4.4 MIL/uL (ref 3.87–5.11)
RDW: 14 % (ref 11.5–15.5)
WBC: 8.3 10*3/uL (ref 4.0–10.5)
nRBC: 0 % (ref 0.0–0.2)

## 2023-02-18 MED ORDER — ENSURE ENLIVE PO LIQD
237.0000 mL | Freq: Three times a day (TID) | ORAL | Status: DC
  Administered 2023-02-18 – 2023-02-20 (×3): 237 mL via ORAL

## 2023-02-18 MED ORDER — ADULT MULTIVITAMIN W/MINERALS CH
1.0000 | ORAL_TABLET | Freq: Every day | ORAL | Status: DC
  Administered 2023-02-18 – 2023-02-20 (×3): 1 via ORAL
  Filled 2023-02-18 (×3): qty 1

## 2023-02-18 MED ORDER — IRBESARTAN 150 MG PO TABS
150.0000 mg | ORAL_TABLET | Freq: Every day | ORAL | Status: DC
  Administered 2023-02-18 – 2023-02-20 (×3): 150 mg via ORAL
  Filled 2023-02-18 (×3): qty 1

## 2023-02-18 MED ORDER — NEPRO/CARBSTEADY PO LIQD
237.0000 mL | Freq: Three times a day (TID) | ORAL | Status: DC
  Administered 2023-02-18: 237 mL via ORAL

## 2023-02-18 MED ORDER — POTASSIUM CHLORIDE 10 MEQ/100ML IV SOLN
10.0000 meq | INTRAVENOUS | Status: AC
  Administered 2023-02-18 (×2): 10 meq via INTRAVENOUS
  Filled 2023-02-18: qty 100

## 2023-02-18 MED ORDER — CARBIDOPA-LEVODOPA 25-100 MG PO TABS
1.0000 | ORAL_TABLET | Freq: Two times a day (BID) | ORAL | Status: DC
  Administered 2023-02-18 – 2023-02-20 (×4): 1 via ORAL
  Filled 2023-02-18 (×4): qty 1

## 2023-02-18 MED ORDER — SODIUM CHLORIDE 0.9 % IV BOLUS
500.0000 mL | Freq: Once | INTRAVENOUS | Status: AC
  Administered 2023-02-18: 500 mL via INTRAVENOUS

## 2023-02-18 MED ORDER — FLUOXETINE HCL 20 MG PO CAPS
20.0000 mg | ORAL_CAPSULE | Freq: Every day | ORAL | Status: DC
  Administered 2023-02-18 – 2023-02-20 (×3): 20 mg via ORAL
  Filled 2023-02-18 (×3): qty 1

## 2023-02-18 NOTE — Progress Notes (Signed)
SLP Follow Up Note  Patient Details Name: Jasmine Malone MRN: [id number] DOB: [birth year]   Pt presents with more alertness and steadily improving cognitive abilities since initially consuming Sinamet this afternoon. At this time, would recommend holding off on formal cognitive evaluation until full  effects of medication are realized and could certainly be deferred to next venue of care to aid in discharge planning for home. Pt and her husband are in agreement with this plan.   Keontay Vora B. Dreama Saa, M.S., CCC-SLP, Tree surgeon Certified Brain Injury Specialist [facility]  [facility] Office [phone] Ascom [phone] Fax [phone]

## 2023-02-18 NOTE — Evaluation (Signed)
Physical Therapy Evaluation Patient Details Name: Jasmine Malone MRN: [id number] DOB: 1945-11-11 Today's Date: 02/18/2023  History of Present Illness  Patient is a 77 year old female with 4-5 day history of AMS, shuffling gait and dysmetria. Recent hospitalization with left hear cath showing nonobstructive CAD. MRI of brain with no acute findings.  Clinical Impression  Patient keeps eyes closed during session unless prompted. She is oriented x 4 and required multi modal cues and increased time for following single step commands. Patient reports she has been using a cane recently with ambulation. The chart indicates shuffling gait at home.  Patient required assistance with all mobility today. She has difficulty sequencing using the rolling walker and does better with 2 person hand held assistance with short distance ambulation. Gait is shuffled. Recommend to continue PT to maximize independence and to decrease caregiver burden. May need to consider rehabilitation at discharge < 3 hours/day if family unable to provide physical assistance with mobility at home.       If plan is discharge home, recommend the following: A lot of help with walking and/or transfers;A lot of help with bathing/dressing/bathroom;Assist for transportation;Help with stairs or ramp for entrance;Assistance with cooking/housework;Direct supervision/assist for medications management   Can travel by private vehicle   No    Equipment Recommendations  (to be determined at next level of care)  Recommendations for Other Services       Functional Status Assessment Patient has had a recent decline in their functional status and demonstrates the ability to make significant improvements in function in a reasonable and predictable amount of time.     Precautions / Restrictions Precautions Precautions: Fall Restrictions Weight Bearing Restrictions: No      Mobility  Bed Mobility Overal bed mobility: Needs Assistance Bed  Mobility: Supine to Sit     Supine to sit: Max assist     General bed mobility comments: assistance for trunk and BLE support. verbal cues for technique, task initiation    Transfers Overall transfer level: Needs assistance Equipment used: 2 person hand held assist Transfers: Sit to/from Stand Sit to Stand: Min assist, +2 physical assistance           General transfer comment: attempted to use rolling walker, however patient has difficulty with sequencing and does better with hand held assistance    Ambulation/Gait Ambulation/Gait assistance: +2 physical assistance Gait Distance (Feet): 4 Feet Assistive device: 2 person hand held assist Gait Pattern/deviations: Decreased stride length, Shuffle, Narrow base of support Gait velocity: decreased     General Gait Details: attempted to use rolling walker, however patient has difficulty grasping rolling walker and does better with hand held assistance. patient keeps eyes closed with ambulation. difficulty with sequencng taking side steps with tactile and verbal cues required  Stairs            Wheelchair Mobility     Tilt Bed    Modified Rankin (Stroke Patients Only)       Balance Overall balance assessment: Needs assistance Sitting-balance support: Feet supported Sitting balance-Leahy Scale: Fair     Standing balance support: Bilateral upper extremity supported Standing balance-Leahy Scale: Poor Standing balance comment: external support required                             Pertinent Vitals/Pain Pain Assessment Pain Assessment: No/denies pain    Home Living Family/patient expects to be discharged to:: Private residence Living Arrangements: Spouse/significant other Available  Help at Discharge: Family Type of Home: House Home Access: Stairs to enter   [employer] of Steps:  (patient unable to state)     Home Equipment: Agricultural consultant (2 wheels);Cane - single point      Prior  Function Prior Level of Function : Needs assist             Mobility Comments: patient reports she is typically independent with mobility with recent use of cane. chart indicates increased assistance required from family at home with shuffling gait ADLs Comments: presume patient reuqired assistance or at least supervision     Extremity/Trunk Assessment   Upper Extremity Assessment Upper Extremity Assessment: Defer to OT evaluation    Lower Extremity Assessment Lower Extremity Assessment: Generalized weakness       Communication   Communication Communication: Difficulty following commands/understanding Following commands: Follows one step commands with increased time Cueing Techniques: Verbal cues;Tactile cues  Cognition Arousal: Lethargic Behavior During Therapy: Flat affect Overall Cognitive Status: No family/caregiver present to determine baseline cognitive functioning Area of Impairment: Orientation, Following commands, Memory, Problem solving, Safety/judgement                 Orientation Level:  (oriented x 4)   Memory: Decreased short-term memory Following Commands: Follows one step commands with increased time Safety/Judgement: Decreased awareness of deficits   Problem Solving: Slow processing, Decreased initiation, Difficulty sequencing, Requires verbal cues, Requires tactile cues General Comments: patient keeps her eyes closed most of the session        General Comments General comments (skin integrity, edema, etc.): patient seated on bed with OT present at end of session. no family present    Exercises     Assessment/Plan    PT Assessment Patient needs continued PT services  PT Problem List Decreased strength;Decreased activity tolerance;Decreased balance;Decreased mobility;Decreased knowledge of use of DME;Decreased safety awareness;Decreased cognition;Decreased coordination       PT Treatment Interventions DME instruction;Stair training;Gait  training;Functional mobility training;Therapeutic activities;Therapeutic exercise;Neuromuscular re-education;Balance training;Cognitive remediation;Patient/family education;Wheelchair mobility training    PT Goals (Current goals can be found in the Care Plan section)  Acute Rehab PT Goals Patient Stated Goal: none stated PT Goal Formulation: With patient Time For Goal Achievement: 03/04/23 Potential to Achieve Goals: Fair    Frequency Min 1X/week     Co-evaluation               AM-PAC PT "6 Clicks" Mobility  Outcome Measure Help needed turning from your back to your side while in a flat bed without using bedrails?: A Lot Help needed moving from lying on your back to sitting on the side of a flat bed without using bedrails?: A Lot Help needed moving to and from a bed to a chair (including a wheelchair)?: A Lot Help needed standing up from a chair using your arms (e.g., wheelchair or bedside chair)?: A Lot Help needed to walk in hospital room?: Total Help needed climbing 3-5 steps with a railing? : Total 6 Click Score: 10    End of Session   Activity Tolerance: Patient limited by lethargy Patient left: in bed;with call bell/phone within reach (OT at bedside)   PT Visit Diagnosis: Difficulty in walking, not elsewhere classified (R26.2);Muscle weakness (generalized) (M62.81)    Time: 4401-0272 PT Time Calculation (min) (ACUTE ONLY): 16 min   Charges:   PT Evaluation $PT Eval Low Complexity: 1 Low   PT General Charges $$ ACUTE PT VISIT: 1 Visit  Donna Bernard, PT, MPT   Ina Homes 02/18/2023, 9:42 AM

## 2023-02-18 NOTE — Assessment & Plan Note (Addendum)
Mental status much improved from yesterday.  Able to hold a conversation and follow commands.  MRI of the brain negative for acute event.  Does not seem to be an infectious process.  Will send off tick borne illness panel including [facility] spotted fever, Lyme and ehrlichiosis.  Will send off RPR.   B12 normal.  EEG did not show seizure.  Neurology started on a trial of Sinemet for possible Parkinson's disease or Parkinson's syndrome.

## 2023-02-18 NOTE — Assessment & Plan Note (Addendum)
Physical therapy recommending home with home health.

## 2023-02-18 NOTE — Progress Notes (Signed)
Progress Note   Patient: Jasmine Malone WUJ:[id number] DOB: 09-16-1945 DOA: 02/17/2023     1 DOS: the patient was seen and examined on 02/18/2023   Brief hospital course: 77 y.o. 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    History obtained from patient's husband and daughter at bedside given patient's altered mental status.  Family states that over the last year, Mrs. Sieminski has had a gradual decline with shuffling gait and occasional difficulty finding her words but this has all seem to improve significantly after she started physical therapy.  Then over the last 4 days approximately, she has began to experience worsening shuffling gait, changes in her hand eye coordination and significant worsening inability to answer questions appropriately.  She seemed confused at times.  Then this morning, she was unable to follow any commands or answer any questions appropriately.  Due to this, they brought her to the ED.  10/11.  Patient states that she has been having trouble talking but cannot complete her symptoms completely.  Answers yes or no questions better.  Able to follow some simple commands.  MRI of the brain negative for stroke.  Urine analysis and chest x-ray negative for infection.  Will get EEG and neurology consultation.  Assessment and Plan: * Acute metabolic encephalopathy MRI of the brain negative for acute event.  Does not seem to be an infectious process.  Will send off tick borne illness panel including [facility] spotted fever, Lyme and ehrlichiosis.  Will send off RPR and vitamin B12.  Will obtain an EEG.  Case discussed with neurology to see in consultation.  Acute heart failure with preserved ejection fraction (HFpEF)  (HCC) EF greater than 55% on echocardiogram at [facility].  I did give a fluid bolus and potassium today while patient is still NPO.  Generalized weakness PT recommending rehab  Obesity (BMI 30-39.9) BMI 35.51  Non-obstructive CAD LHC on 10/0/2024 demonstrated mild nonobstructive CAD.   Thyroid cancer (HCC) TSH normal range continue Synthroid  Hyperlipidemia, unspecified On Lipitor  HTN (hypertension) Patient takes olmesartan at home can use Avapro here.  Depression On Prozac        Subjective: Patient was able to answer some simple yes or no questions but unable to elaborate.  She states that she has trouble talking but cannot elaborate more than that.  Able to follow some simple commands like straight leg raise.  Husband noticed over the last few days symptoms have gotten worse.  They were scheduled to see a neurologist as outpatient but where 15 minutes late for the appointment so they had to reschedule.  Physical Exam: Vitals:   02/18/23 0200 02/18/23 0400 02/18/23 0506 02/18/23 0808  BP: (!) 158/75 (!) 158/68 (!) 158/79 (!) 158/77  Pulse: 77 74 75 71  Resp:   19 16  Temp: 98.1 F (36.7 C) 98.1 F (36.7 C) 98 F (36.7 C) 98 F (36.7 C)  TempSrc: Oral Oral    SpO2:   97% 94%  Weight:      Height:       Physical Exam HENT:     Head: Normocephalic.     Mouth/Throat:     Pharynx: No oropharyngeal exudate.  Eyes:     General: Lids are normal.     Conjunctiva/sclera: Conjunctivae normal.  Comments: Have to pry open her eyes in order to look at her eyes.  Cardiovascular:     Rate and Rhythm: Normal rate and regular rhythm.     Heart sounds: Normal heart sounds, S1 normal and S2 normal.  Pulmonary:     Breath sounds: No decreased breath sounds, wheezing, rhonchi or rales.  Abdominal:     Palpations: Abdomen is soft.     Tenderness: There is no abdominal tenderness.  Musculoskeletal:     Right lower leg: No swelling.     Left lower leg: No swelling.  Skin:     General: Skin is warm.     Findings: No rash.  Neurological:     Mental Status: She is lethargic.     Comments: Patient kept her eyes closed.  Patient was able to straight leg raise.  Power 4 out of 5 bilateral upper and lower extremities.     Data Reviewed: Creatinine 0.58, potassium 3.4, CBC normal range.  Platelet count improved to 168.  TSH 2.099, urine analysis negative, chest x-ray does not show pneumonia, MRI of the brain negative for acute event.  Family Communication: Spoke with husband on the phone  Disposition: Status is: Inpatient Remains inpatient appropriate because: Patient admitted with altered mental status and MRI of the brain negative.  Does not seem to be infectious related will send off tickborne illnesses.  Will get an EEG of the brain  Planned Discharge Destination: Will likely need rehab    Time spent: 28 minutes  Author: Alford Highland, MD 02/18/2023 10:59 AM  For on call review [email or web address].

## 2023-02-18 NOTE — Assessment & Plan Note (Signed)
On Prozac

## 2023-02-18 NOTE — Consult Note (Signed)
NEUROLOGY CONSULT NOTE   Date of service: February 18, 2023 Patient Name: Jasmine Malone MRN:  [id number] DOB:  [birth year] Chief Complaint: "worsening functional ability" Requesting Provider: Alford Highland, MD  History of Present Illness  SELENIA Malone is a 77 y.o. female  has a past medical history of Arthritis, Asthma, Cancer, Cataracts, bilateral (05/10/2012), Dysplastic nevus (07/22/2021), Generalized anxiety disorder, GERD (gastroesophageal reflux disease), H. pylori infection, Heart murmur, Hepatitis, Hiatal hernia, Hyperlipidemia, Major depressive disorder, Parathyroid abnormality, PONV (postoperative nausea and vomiting), Post traumatic stress disorder (PTSD), and Tremor. who presents with worsening functional ability.   History obtained from patient's husband and daughter in ED. Family stated that over the last year, Jasmine Malone has had a gradual decline with shuffling gait and occasional difficulty finding her words but this has all seem to improve significantly after she started physical therapy. Then over the last 4 days approximately, she has began to experience worsening shuffling gait, changes in her hand eye coordination and significant worsening inability to answer questions appropriately. She seemed confused at times. Then yesterday, she was unable to follow any commands or answer any questions appropriately. Due to this, they brought her to the ED.  Patient reports she does not have a neurologist and no neurology notes noted in the chart.      ROS  Comprehensive ROS performed and pertinent positives documented in HPI   Past History   Past Medical History:  Diagnosis Date   Arthritis    Asthma    Albuterol use prior to exercise.  No hospitalizations or ED visits.   Cancer    Squamous cell cancer   Cataracts, bilateral 05/10/2012   s/p B cataract resection.   Dysplastic nevus 07/22/2021   spinal upper back, mild   Generalized anxiety disorder    GERD  (gastroesophageal reflux disease)    H. pylori infection    Heart murmur    Hepatitis    Type A as a child   Hiatal hernia    Hyperlipidemia    Major depressive disorder    Followed by therapist once per month; Nan Hyball in [facility].   Parathyroid abnormality    PONV (postoperative nausea and vomiting)    Post traumatic stress disorder (PTSD)    Tremor     Past Surgical History:  Procedure Laterality Date   CATARACT EXTRACTION, BILATERAL     DILATATION & CURETTAGE/HYSTEROSCOPY WITH TRUECLEAR N/A 02/20/2014   Procedure: DILATATION & CURETTAGE/HYSTEROSCOPY WITH TRUCLEAR;  Surgeon: Meriel Pica, MD;  Location: [facility];  Service: Gynecology;  Laterality: N/A;   LAPAROSCOPIC CHOLECYSTECTOMY  1998   PARATHYROIDECTOMY  1997   3 resected.     RETINAL DETACHMENT SURGERY Right 2012   RETINAL DETACHMENT SURGERY Left    x 4   TUBAL LIGATION  1993    Family History: Family History  Problem Relation Age of Onset   Stroke Mother 24       CVA; tobacco abuse.   Mental illness Mother    Alcohol abuse Mother    Diabetes Father    Cancer Father        brain cancer/malignancy   Mental illness Father    Diabetes Brother    Kidney failure Brother    Diabetes Sister    Depression Sister    Cancer Sister    Colon cancer Neg Hx     Social History  reports that she has never smoked. She has never used smokeless tobacco. She reports current alcohol use.  She reports that she does not use drugs.  Allergies  Allergen Reactions   Codeine Nausea Only    Can take Percocet with Zofran   Ibuprofen Other (See Comments)    Irritates stomach due to hiatal hernia    Shellfish Allergy Nausea Only    Medications   Current Facility-Administered Medications:    acetaminophen (TYLENOL) tablet 650 mg, 650 mg, Oral, Q6H PRN **OR** acetaminophen (TYLENOL) suppository 650 mg, 650 mg, Rectal, Q6H PRN, Verdene Lennert, MD   albuterol (PROVENTIL) (2.5 MG/3ML) 0.083% nebulizer solution 3 mL, 3 mL,  Inhalation, Q6H PRN, Verdene Lennert, MD   atorvastatin (LIPITOR) tablet 20 mg, 20 mg, Oral, Daily, Verdene Lennert, MD   enoxaparin (LOVENOX) injection 40 mg, 40 mg, Subcutaneous, Q24H, Verdene Lennert, MD, 40 mg at 02/17/23 2100   FLUoxetine (PROZAC) capsule 20 mg, 20 mg, Oral, Daily, Orson Aloe, RPH   levothyroxine (SYNTHROID) tablet 88 mcg, 88 mcg, Oral, QAC breakfast, Verdene Lennert, MD   ondansetron (ZOFRAN) tablet 4 mg, 4 mg, Oral, Q6H PRN **OR** ondansetron (ZOFRAN) injection 4 mg, 4 mg, Intravenous, Q6H PRN, Verdene Lennert, MD   potassium chloride 10 mEq in 100 mL IVPB, 10 mEq, Intravenous, Q1 Hr x 2, Wieting, Richard, MD, Last Rate: 100 mL/hr at 02/18/23 0951, 10 mEq at 02/18/23 0951   sodium chloride flush (NS) 0.9 % injection 3 mL, 3 mL, Intravenous, Q12H, Verdene Lennert, MD, 3 mL at 02/18/23 0951  Vitals   Vitals:   02/18/23 0200 02/18/23 0400 02/18/23 0506 02/18/23 0808  BP: (!) 158/75 (!) 158/68 (!) 158/79 (!) 158/77  Pulse: 77 74 75 71  Resp:   19 16  Temp: 98.1 F (36.7 C) 98.1 F (36.7 C) 98 F (36.7 C) 98 F (36.7 C)  TempSrc: Oral Oral    SpO2:   97% 94%  Weight:      Height:        Body mass index is 35.51 kg/m.  Physical Exam   Constitutional: Appears well-developed and well-nourished.  Psych: Affect appropriate to situation.  Eyes: No scleral injection.  Head: Normocephalic.  Cardiovascular: Normal rate and regular rhythm.  Respiratory: Effort normal, non-labored breathing.    Neurologic Examination   Mental Status: Alert, oriented, thought content appropriate.  Speech fluent without evidence of aphasia.  Able to follow 3 step commands without difficulty.  Masked facies Cranial Nerves: II: Discs flat bilaterally; Visual fields grossly normal, pupils equal, round, reactive to light and accommodation III,IV, VI: ptosis not present, extra-ocular motions intact bilaterally V,VII: smile symmetric, facial light touch sensation normal  bilaterally VIII: hearing normal bilaterally XI: bilateral shoulder shrug XII: midline tongue extension Motor: 5/5 throughout.  Increased tone throughout, somewhat more on the right than on the left.  Minor tremor in the upper extremities. Bradykinetic throughout  Sensory: Pinprick and light touch intact throughout, bilaterally Deep Tendon Reflexes: 2+ and symmetric with absent AJ's bilaterally Plantars: Right: equivocal   Left: equivocal Cerebellar: normal finger-to-nose testing.  Difficulty with heel-to-shin testing due to increased tone.   Gait: not tested due to safety concerns  Labs   CBC:  Recent Labs  Lab 02/17/23 1340 02/18/23 0416  WBC 7.5 8.3  NEUTROABS 4.3  --   HGB 13.8 14.2  HCT 41.8 40.8  MCV 97.9 92.7  PLT 115* 168    Basic Metabolic Panel:  Lab Results  Component Value Date   NA 143 02/18/2023   K 3.4 (L) 02/18/2023   CO2 26 02/18/2023   GLUCOSE  112 (H) 02/18/2023   BUN 12 02/18/2023   CREATININE 0.58 02/18/2023   CALCIUM 8.9 02/18/2023   GFRNONAA >60 02/18/2023   GFRAA >89 04/25/2013   Lipid Panel:  Lab Results  Component Value Date   LDLCALC 97 09/25/2014   HgbA1c:  Lab Results  Component Value Date   HGBA1C 5.5 04/25/2013   Urine Drug Screen: No results found for: "LABOPIA", "COCAINSCRNUR", "LABBENZ", "AMPHETMU", "THCU", "LABBARB"  Alcohol Level     Component Value Date/Time   ETH <10 02/17/2023 1340   INR  Lab Results  Component Value Date   INR 1.2 02/17/2023   APTT  Lab Results  Component Value Date   APTT 23 (L) 02/17/2023   AED levels: No results found for: "PHENYTOIN", "ZONISAMIDE", "LAMOTRIGINE", "LEVETIRACETA"  MRI Brain(Personally reviewed): MRI HEAD WITHOUT CONTRAST   TECHNIQUE: Multiplanar, multiecho pulse sequences of the brain and surrounding structures were obtained without intravenous contrast.   COMPARISON:  Head CT from earlier today   FINDINGS: Brain: No acute infarction, hemorrhage, hydrocephalus,  extra-axial collection or mass lesion. FLAIR hyperintensity in the cerebral white matter with nonspecific pattern, usually chronic Witz vessel ischemia common overall mild for age. Brain volume is normal. T1 shortening at the globus pallidus, often attributed to liver failure.   Vascular: Normal flow voids.   Skull and upper cervical spine: Normal marrow signal.   Sinuses/Orbits: Negative.   IMPRESSION: 1. No acute finding. 2. Mild for age chronic white matter disease. 3. T1 hyperintensity in the basal ganglia, correlate for chronic liver failure.  rEEG:  pending  Impression   REMIE WHITMER is a 77 y.o. female  has a past medical history of Arthritis, Asthma, Cancer, Cataracts, bilateral (05/10/2012), Dysplastic nevus (07/22/2021), Generalized anxiety disorder, GERD (gastroesophageal reflux disease), H. pylori infection, Heart murmur, Hepatitis, Hiatal hernia, Hyperlipidemia, Major depressive disorder, Parathyroid abnormality, PONV (postoperative nausea and vomiting), Post traumatic stress disorder (PTSD), and Tremor. who presents with worsening functional ability.  Has symptoms on examination of Parkinsonism.  PD is on the differential but other Parkinsonian syndromes to be considered as well.  Although white matter disease noted on MRI imaging, this is not extensive.  No evidence of acute changes.  A trial of Sinemet may be helpful.   EEG pending.  TSH normal     Recommendations  Sinemet 25/100 BID to be started today.  May increase to TID depending on response.  Therapy to continue to follow to help with evaluating response RPR, B12 pending.  Would add heavy metal screen and serum copper Orthostatic vitals ______________________________________________________________________    Noralee Stain, Arkie Tagliaferro

## 2023-02-18 NOTE — Assessment & Plan Note (Addendum)
BMI 37.01 which I do not think is accurate.

## 2023-02-18 NOTE — Procedures (Signed)
Patient Name: Jasmine Malone  MRN: [id number]  Epilepsy Attending: Charlsie Quest  Referring Physician/Provider: Alford Highland, MD  Date: 02/18/2023 Duration: 32.58 mins  Patient history: 77yo F with ams getting eeg to evaluate for seizure  Level of alertness: Awake/ lethargic   AEDs during EEG study: None  Technical aspects: This EEG study was done with scalp electrodes positioned according to the 10-20 International system of electrode placement. Electrical activity was reviewed with band pass filter of 1-70Hz , sensitivity of 7 uV/mm, display speed of 11mm/sec with a 60Hz  notched filter applied as appropriate. EEG data were recorded continuously and digitally stored.  Video monitoring was available and reviewed as appropriate.  Description: EEG showed continuous generalized 3 to 5 Hz theta-delta slowing, at times with triphasic morphology. Hyperventilation and photic stimulation were not performed.     ABNORMALITY - Continuous slow, generalized  IMPRESSION: This study is suggestive of moderate diffuse encephalopathy. No seizures or epileptiform discharges were seen throughout the recording.  [name] Annabelle Harman

## 2023-02-18 NOTE — Assessment & Plan Note (Signed)
On Lipitor 

## 2023-02-18 NOTE — Progress Notes (Signed)
Initial Nutrition Assessment  DOCUMENTATION CODES:   Obesity unspecified  INTERVENTION:   -Ensure Enlive po TID, each supplement provides 350 kcal and 20 grams of protein.  -Magic cup TID with meals, each supplement provides 290 kcal and 9 grams of protein  -MVI with minerals daily   NUTRITION DIAGNOSIS:   Inadequate oral intake related to poor appetite as evidenced by meal completion < 25%.  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  Pt admitted with AMS.   Reviewed I/O's: -546 ml x 24 hours  UOP: 700 ml x 24 hours  Spoke with pt at husband at bedside. Pt reports feeling better and pleasant and in good spirits today. Husband shares mental status has improved today. Pt has had general functional decline over the past few months, which husband attributes to multiple UTIs. Pt often "shuffles" when walking and last fall was last month.   Pt typically has a very good appetite. Pt generally consumes 2-3 meals per day. Husband reports pt consumed a hot dog and chocolate ice cream 1 day PTA. Pt also enjoys protein bars with peanut butter and macadamia nuts from [employer]. Noted meal completions 100%.   Pt only ate a few bites of lunch due to multiple interruptions.   Reviewed wt hx; no wt loss noted. Pt reports she may have lost "a few pounds" over the past few days. Husband shares wt is stable.   Discussed importance of good meal and supplement intake to promote healing.. Pt amenable to Ensure supplements.   Medications reviewed and include  sinemet.   Lab Results  Component Value Date   HGBA1C 5.5 04/25/2013   PTA DM medications are none.   Labs reviewed: K: 3.4, CBGS: 127 (inpatient orders for glycemic control are none).    NUTRITION - FOCUSED PHYSICAL EXAM:  Flowsheet Row Most Recent Value  Orbital Region No depletion  Upper Arm Region No depletion  Thoracic and Lumbar Region No depletion  Buccal Region No depletion  Temple Region No depletion  Clavicle Bone Region No depletion  Clavicle and Acromion Bone Region No depletion  Scapular Bone Region No depletion  Dorsal Hand No depletion  Patellar Region No depletion  Anterior Thigh Region No depletion  Posterior Calf Region No depletion  Edema (RD Assessment) Mild  Hair Reviewed  Eyes Reviewed  Mouth Reviewed  Skin Reviewed  Nails Reviewed       Diet Order:   Diet Order             Diet regular Fluid consistency: Thin  Diet effective now                   EDUCATION NEEDS:   Education needs have been addressed  Skin:  Skin Assessment: Reviewed RN Assessment  Last BM:  Unknown  Height:   Ht Readings from Last 1 Encounters:  02/17/23 5\' 6"  (1.676 m)    Weight:   Wt Readings from Last 1 Encounters:  02/17/23 99.8 kg    Ideal Body Weight:  59.1 kg  BMI:  Body mass index is 35.51 kg/m.  Estimated Nutritional Needs:  Kcal:  1600-1800  Protein:  85-100 grams  Fluid:  > 1.6 L    Levada Schilling, RD, LDN, CDCES Registered Dietitian III Certified Diabetes Care and Education Specialist Please refer to [facility] for RD and/or RD on-call/weekend/after hours pager

## 2023-02-18 NOTE — Progress Notes (Signed)
Eeg done 

## 2023-02-18 NOTE — Hospital Course (Addendum)
77 y.o. 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    History obtained from patient's husband and daughter at bedside given patient's altered mental status.  Family states that over the last year, Mrs. Catalano has had a gradual decline with shuffling gait and occasional difficulty finding her words but this has all seem to improve significantly after she started physical therapy.  Then over the last 4 days approximately, she has began to experience worsening shuffling gait, changes in her hand eye coordination and significant worsening inability to answer questions appropriately.  She seemed confused at times.  Then this morning, she was unable to follow any commands or answer any questions appropriately.  Due to this, they brought her to the ED.  10/11.  Patient states that she has been having trouble talking but cannot complete her symptoms completely.  Answers yes or no questions better.  Able to follow some simple commands.  MRI of the brain negative for stroke.  Urine analysis and chest x-ray negative for infection.  EEG did not show any seizure.  Neurology started on Sinemet. 10/12.  Patient doing much better.  Mental status improved.  Able to talk better.

## 2023-02-18 NOTE — Evaluation (Signed)
Occupational Therapy Evaluation Patient Details Name: Jasmine Malone MRN: [id number] DOB: [birth year] Today's Date: 02/18/2023   History of Present Illness Patient is a 76 year old female with 4-5 day history of AMS, shuffling gait, word finding issues, and dysmetria. Recent hospitalization with left heart cath showing nonobstructive CAD. MRI of brain with no acute findings.   Clinical Impression   Pt was seen for OT evaluation this date. Prior to hospital admission, pt was living with her husband and IND with ADL performance and mobility with recent use of cane. Per chart, pt has needed more assistance lately d/t shuffling gait and was attending outpatient therapy with some improvement.  Pt presents to acute OT demonstrating impaired ADL performance and functional mobility 2/2 STM deficits/confusion, lethargy, weakness and balance deficits (See OT problem list for additional functional deficits). Pt currently requires Min A x2 via handheld assist d/t inability to follow cues to use RW with ability to take a few steps forward/backward. Pt unable to follow cues to lateral scoot to [facility] from seated position, but moved the opposite direction. Provided max A for trunk and BLE management to return pt to supine in bed. Planned to attempt oral hygiene seated at EOB, however pt NPO and unable to follow commands/noted to have eyes closed most of session, therefore did not perform. Pt would benefit from skilled OT services to address noted impairments and functional limitations (see below for any additional details) in order to maximize safety and independence while minimizing falls risk and caregiver burden. Recommending SNF upon DC.       If plan is discharge home, recommend the following: A lot of help with walking and/or transfers;A lot of help with bathing/dressing/bathroom;Assistance with cooking/housework;Assist for transportation;Help with stairs or ramp for entrance    Functional Status Assessment   Patient has had a recent decline in their functional status and demonstrates the ability to make significant improvements in function in a reasonable and predictable amount of time.  Equipment Recommendations  Other (comment) (defer to next venue)    Recommendations for Other Services       Precautions / Restrictions Precautions Precautions: Fall Restrictions Weight Bearing Restrictions: No      Mobility Bed Mobility Overal bed mobility: Needs Assistance Bed Mobility: Sit to Supine       Sit to supine: Max assist   General bed mobility comments: pt asked to lateral scoot to [facility] and went opposite direction, needed verbal cues for technique and task initiation, ultimately needed Max A for trunk and BLE management    Transfers Overall transfer level: Needs assistance Equipment used: 2 person hand held assist Transfers: Sit to/from Stand Sit to Stand: Min assist, +2 physical assistance           General transfer comment: attempted to use rolling walker, however patient has difficulty with sequencing and does better with hand held assistance      Balance Overall balance assessment: Needs assistance Sitting-balance support: Feet supported Sitting balance-Leahy Scale: Fair Sitting balance - Comments: able to maintain steady static seated balance at EOB   Standing balance support: Bilateral upper extremity supported Standing balance-Leahy Scale: Poor Standing balance comment: external support required                           ADL either performed or assessed with clinical judgement   ADL Overall ADL's : Needs assistance/impaired  General ADL Comments: did not assess during eval d/t pt lethragy, keeping eyes closed most of session. Likely to need sigificant assistance with ADLs at this time d/t high fall risk from weakness and max cueing     Vision         Perception         Praxis          Pertinent Vitals/Pain Pain Assessment Pain Assessment: No/denies pain     Extremity/Trunk Assessment Upper Extremity Assessment Upper Extremity Assessment: Generalized weakness   Lower Extremity Assessment Lower Extremity Assessment: Generalized weakness       Communication Communication Communication: Difficulty following commands/understanding Following commands: Follows one step commands with increased time Cueing Techniques: Verbal cues;Tactile cues   Cognition Arousal: Lethargic Behavior During Therapy: Flat affect Overall Cognitive Status: No family/caregiver present to determine baseline cognitive functioning Area of Impairment: Orientation, Following commands, Memory, Problem solving, Safety/judgement                 Orientation Level:  (oriented x 4)   Memory: Decreased short-term memory Following Commands: Follows one step commands with increased time Safety/Judgement: Decreased awareness of deficits   Problem Solving: Slow processing, Decreased initiation, Difficulty sequencing, Requires verbal cues, Requires tactile cues General Comments: patient keeps her eyes closed most of the session     General Comments  seated EOB with PT on entry to room    Exercises     Shoulder Instructions      Home Living Family/patient expects to be discharged to:: Private residence Living Arrangements: Spouse/significant other Available Help at Discharge: Family Type of Home: House Home Access: Stairs to enter [employer] of Steps: pt unable to state (patient unable to state)                   Home Equipment: Agricultural consultant (2 wheels);Cane - single point          Prior Functioning/Environment Prior Level of Function : Needs assist             Mobility Comments: patient reports she is typically independent with mobility with recent use of cane. chart indicates increased assistance required from family at home with shuffling gait ADLs  Comments: presume patient reuqired assistance or at least supervision        OT Problem List: Decreased strength;Impaired balance (sitting and/or standing);Decreased safety awareness      OT Treatment/Interventions: Self-care/ADL training;Therapeutic exercise;Patient/family education;Balance training;Therapeutic activities;DME and/or AE instruction    OT Goals(Current goals can be found in the care plan section) Acute Rehab OT Goals Patient Stated Goal: get stronger to return home with husband OT Goal Formulation: With patient Time For Goal Achievement: 03/04/23 Potential to Achieve Goals: Fair ADL Goals Pt Will Perform Grooming: sitting;with supervision Pt Will Perform Lower Body Dressing: sit to/from stand;with mod assist Pt Will Transfer to Toilet: with contact guard assist;bedside commode;stand pivot transfer  OT Frequency: Min 1X/week    Co-evaluation PT/OT/SLP Co-Evaluation/Treatment: Yes Reason for Co-Treatment: For patient/therapist safety          AM-PAC OT "6 Clicks" Daily Activity     Outcome Measure Help from another person eating meals?: A Little Help from another person taking care of personal grooming?: A Little Help from another person toileting, which includes using toliet, bedpan, or urinal?: A Lot Help from another person bathing (including washing, rinsing, drying)?: A Lot Help from another person to put on and taking off regular upper body clothing?: A Lot Help from  another person to put on and taking off regular lower body clothing?: A Lot 6 Click Score: 14   End of Session Equipment Utilized During Treatment: Rolling walker (2 wheels)  Activity Tolerance: Patient limited by lethargy Patient left: in bed;with call bell/phone within reach;with bed alarm set  OT Visit Diagnosis: Other abnormalities of gait and mobility (R26.89);Muscle weakness (generalized) (M62.81)                Time: 6301-6010 OT Time Calculation (min): 15 min Charges:  OT General  Charges $OT Visit: 1 Visit OT Evaluation $OT Eval Moderate Complexity: 1 Mod Lashaya Kienitz, OTR/L 02/18/23, 10:50 AM  Nicolle Heward E Jhase Creppel 02/18/2023, 10:46 AM

## 2023-02-18 NOTE — Progress Notes (Signed)
Per Dr Renae Gloss, dc NIH scale orders

## 2023-02-19 DIAGNOSIS — E876 Hypokalemia: Secondary | ICD-10-CM | POA: Insufficient documentation

## 2023-02-19 DIAGNOSIS — R531 Weakness: Secondary | ICD-10-CM | POA: Diagnosis not present

## 2023-02-19 DIAGNOSIS — I5031 Acute diastolic (congestive) heart failure: Secondary | ICD-10-CM | POA: Diagnosis not present

## 2023-02-19 DIAGNOSIS — F3289 Other specified depressive episodes: Secondary | ICD-10-CM | POA: Diagnosis not present

## 2023-02-19 DIAGNOSIS — G9341 Metabolic encephalopathy: Secondary | ICD-10-CM | POA: Diagnosis not present

## 2023-02-19 LAB — CBC
HCT: 41.7 % (ref 36.0–46.0)
Hemoglobin: 14.3 g/dL (ref 12.0–15.0)
MCH: 32.6 pg (ref 26.0–34.0)
MCHC: 34.3 g/dL (ref 30.0–36.0)
MCV: 95.2 fL (ref 80.0–100.0)
Platelets: 168 10*3/uL (ref 150–400)
RBC: 4.38 MIL/uL (ref 3.87–5.11)
RDW: 13.9 % (ref 11.5–15.5)
WBC: 8.6 10*3/uL (ref 4.0–10.5)
nRBC: 0 % (ref 0.0–0.2)

## 2023-02-19 LAB — BASIC METABOLIC PANEL
Anion gap: 9 (ref 5–15)
BUN: 18 mg/dL (ref 8–23)
CO2: 27 mmol/L (ref 22–32)
Calcium: 8.8 mg/dL — ABNORMAL LOW (ref 8.9–10.3)
Chloride: 103 mmol/L (ref 98–111)
Creatinine, Ser: 0.64 mg/dL (ref 0.44–1.00)
GFR, Estimated: >60 mL/min (ref >60)
Glucose, Bld: 95 mg/dL (ref 70–99)
Potassium: 3.4 mmol/L — ABNORMAL LOW (ref 3.5–5.1)
Sodium: 139 mmol/L (ref 135–145)

## 2023-02-19 LAB — RPR: RPR Ser Ql: NONREACTIVE

## 2023-02-19 LAB — CERULOPLASMIN: Ceruloplasmin: 26.2 mg/dL (ref 19.0–39.0)

## 2023-02-19 LAB — VITAMIN B12: Vitamin B-12: 921 pg/mL — ABNORMAL HIGH (ref 180–914)

## 2023-02-19 MED ORDER — POTASSIUM CHLORIDE CRYS ER 20 MEQ PO TBCR
40.0000 meq | EXTENDED_RELEASE_TABLET | Freq: Once | ORAL | Status: AC
  Administered 2023-02-19: 40 meq via ORAL
  Filled 2023-02-19: qty 2

## 2023-02-19 NOTE — Progress Notes (Addendum)
Progress Note   Patient: Jasmine Malone AOZ:[id number] DOB: 1945-05-18 DOA: 02/17/2023     2 DOS: the patient was seen and examined on 02/19/2023   Brief hospital course: 77 y.o. 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    History obtained from patient's husband and daughter at bedside given patient's altered mental status.  Family states that over the last year, Jasmine Malone has had a gradual decline with shuffling gait and occasional difficulty finding her words but this has all seem to improve significantly after she started physical therapy.  Then over the last 4 days approximately, she has began to experience worsening shuffling gait, changes in her hand eye coordination and significant worsening inability to answer questions appropriately.  She seemed confused at times.  Then this morning, she was unable to follow any commands or answer any questions appropriately.  Due to this, they brought her to the ED.  10/11.  Patient states that she has been having trouble talking but cannot complete her symptoms completely.  Answers yes or no questions better.  Able to follow some simple commands.  MRI of the brain negative for stroke.  Urine analysis and chest x-ray negative for infection.  EEG did not show any seizure.  Neurology started on Sinemet. 10/12.  Patient doing much better.  Mental status improved.  Able to talk better.  Assessment and Plan: * Acute metabolic encephalopathy Mental status much improved from yesterday.  Able to hold a conversation and follow commands.  MRI of the brain negative for acute event.  Does not seem to be an infectious process.  Will send off tick borne illness panel including [facility] spotted fever,  Lyme and ehrlichiosis.  Will send off RPR.   B12 normal.  EEG did not show seizure.  Neurology started on a trial of Sinemet for possible Parkinson's disease or Parkinson's syndrome.  Acute heart failure with preserved ejection fraction (HFpEF) (HCC) EF greater than 55% on echocardiogram at [facility].  Continue to monitor off medication.  Hypokalemia Replace orally  Generalized weakness PT recommending rehab.  Since mental status is better today maybe she will be able to do better with physical therapy.  Obesity (BMI 30-39.9) BMI 34.48  Non-obstructive CAD LHC on 10/0/2024 demonstrated mild nonobstructive CAD.   Thyroid cancer (HCC) TSH normal range continue Synthroid  Hyperlipidemia, unspecified On Lipitor  HTN (hypertension) Patient takes olmesartan at home can use Avapro here.  Depression On Prozac        Subjective: Patient feeling okay.  Doing better than yesterday.  Talking better.  Following commands.  Started on Sinemet yesterday.  Admitted with altered mental status.  Physical Exam: Vitals:   02/19/23 0448 02/19/23 0500 02/19/23 0816 02/19/23 1046  BP: (!) 143/64  (!) 159/68 (!) 146/62  Pulse: 68  60 65  Resp: 18  18 16   Temp: 98 F (36.7 C)  98.2 F (36.8 C) 97.9 F (36.6 C)  TempSrc: Oral   Oral  SpO2: 97%  97% 99%  Weight:  96.9 kg    Height:       Physical Exam HENT:     Head: Normocephalic.     Mouth/Throat:     Pharynx: No oropharyngeal exudate.  Eyes:     General: Lids are normal.     Conjunctiva/sclera: Conjunctivae normal.  Cardiovascular:     Rate and Rhythm: Normal rate and regular rhythm.     Heart sounds: Normal heart sounds, S1 normal and S2 normal.  Pulmonary:     Breath sounds: No decreased breath sounds, wheezing, rhonchi or rales.  Abdominal:     Palpations: Abdomen is soft.     Tenderness: There is no abdominal tenderness.  Musculoskeletal:     Right lower leg: No swelling.     Left lower leg: No swelling.  Skin:     General: Skin is warm.     Findings: No rash.  Neurological:     Mental Status: She is alert.     Comments: Able to talk much better today.  Able to hold a conversation.  Able to follow commands.  Able to straight leg raise without a problem.     Data Reviewed: Potassium 3.4, creatinine 0.64, vitamin B12 921, ceruloplasmin 26.2, CBC normal range, TSH normal range  Family Communication: Spoke with husband and daughter at the bedside  Disposition: Status is: Inpatient Remains inpatient appropriate because: Patient undergoing a trial of Sinemet.  Continue working with physical therapy.  Mental status much improved today as compared to yesterday.  Planned Discharge Destination: Rehab    Time spent: 28 minutes  Author: Alford Highland, MD 02/19/2023 11:03 AM  For on call review [email or web address].

## 2023-02-19 NOTE — TOC Initial Note (Addendum)
Transition of Care [facility]) - Initial/Assessment Note    Patient Details  Name: Jasmine Malone MRN: [id number] Date of Birth: Mar 26, 1946  [facility] CM/SW Contact:    Liliana Cline, LCSW Phone Number: 02/19/2023, 11:27 AM  Clinical Narrative:                 Met with patient and spouse at bedside. Patient is from home with spouse who is involved and supportive. Spouse provides transportation. PCP is Dr. Katrinka Blazing. [facility]. Patient has grab bars, tub transfer bench, walker, and cane at home. Patient was going to OPPT in [place] prior to admission.  Patient and spouse are aware of SNF recommendation. They would like to see how patient does with PT today before deciding about SNF versus HH. They are hopeful for patient to improve and go home with [facility] - they declined [facility] agency preference if this is the plan. TOC will continue to follow, updated PT and MD.   12:28- Updated by PTA - patient did well today, wants to see how she does tomorrow before officially deciding but likely home with [facility]. TOC to follow up tomorrow following PT session.   Expected Discharge Plan: Home w Home Health Services Barriers to Discharge: Continued Medical Work up   Patient Goals and CMS Choice Patient states their goals for this hospitalization and ongoing recovery are:: TBD after PT sees today CMS Medicare.gov Compare Post Acute Care list provided to:: Patient Choice offered to / list presented to : Patient, Spouse      Expected Discharge Plan and Services       Living arrangements for the past 2 months: Single Family Home                                      Prior Living Arrangements/Services Living arrangements for the past 2 months: Single Family Home Lives with:: Spouse Patient language and need for interpreter reviewed:: Yes Do you feel safe going back to the place where you live?: Yes      Need for Family Participation in Patient Care: Yes  (Comment) Care giver support system in place?: Yes (comment) Current home services: DME Criminal Activity/Legal Involvement Pertinent to Current Situation/Hospitalization: No - Comment as needed  Activities of Daily Living   ADL Screening (condition at time of admission) Independently performs ADLs?: No Does the patient have a NEW difficulty with bathing/dressing/toileting/self-feeding that is expected to last >3 days?: Yes (Initiates electronic notice to provider for possible OT consult) Does the patient have a NEW difficulty with getting in/out of bed, walking, or climbing stairs that is expected to last >3 days?: Yes (Initiates electronic notice to provider for possible PT consult) Does the patient have a NEW difficulty with communication that is expected to last >3 days?: No Is the patient deaf or have difficulty hearing?: No Does the patient have difficulty seeing, even when wearing glasses/contacts?: Yes Does the patient have difficulty concentrating, remembering, or making decisions?: Yes  Permission Sought/Granted Permission sought to share information with : Facility Medical sales representative, Family Supports Permission granted to share information with : Yes, Verbal Permission Granted     Permission granted to share info w AGENCY: HH, DME, SNFs  Permission granted to share info w Relationship: spouse     Emotional Assessment       Orientation: : Oriented to Self, Oriented to Place, Oriented  to  Time, Oriented to Situation Alcohol / Substance Use: Not Applicable Psych Involvement: No (comment)  Admission diagnosis:  Altered mental status [R41.82] Generalized weakness [R53.1] Altered mental status, unspecified altered mental status type [R41.82] Patient Active Problem List   Diagnosis Date Noted   Hypokalemia 02/19/2023   Acute metabolic encephalopathy 02/18/2023   Obesity (BMI 30-39.9) 02/18/2023   Generalized weakness 02/18/2023   Acute heart failure with preserved  ejection fraction (HFpEF) (HCC) 02/17/2023   Non-obstructive CAD 02/17/2023   HTN (hypertension) 02/09/2023   Hyperlipidemia, unspecified 02/09/2023   NSTEMI (non-ST elevated myocardial infarction) (HCC) 02/09/2023   Recurrent UTI 03/10/2022   Multinodular goiter 10/16/2021   Adrenal adenoma, right 10/15/2021   Thyroid cancer (HCC) 10/15/2021   Vocal fold paralysis, right 07/17/2021   Depression    Generalized anxiety disorder    Post traumatic stress disorder (PTSD)    Tremor    Class 2 obesity due to excess calories without serious comorbidity with body mass index (BMI) of 37.0 to 37.9 in adult 12/07/2016   Gastroesophageal reflux disease without esophagitis 12/07/2016   Urge incontinence of urine 12/07/2016   Post-menopausal bleeding 02/13/2014   PCP:  Ethelda Chick, MD [facility] [phone] [place], [place] - [address] [place] [address] Phone: [phone] Fax: [phone]     Social Determinants of Health (SDOH) Social History: SDOH Screenings   Food Insecurity: No Food Insecurity (02/17/2023)  Housing: Low Risk  (02/17/2023)  Transportation Needs: No Transportation Needs (02/17/2023)  Utilities: Not At Risk (02/17/2023)  Depression (PHQ2-9): Low Risk  (10/10/2019)  Financial Resource Strain: Low Risk  (06/10/2022)   Received from [facility]  Tobacco Use: Low Risk  (02/17/2023)   SDOH Interventions:     Readmission Risk Interventions    02/19/2023   11:26 AM  Readmission Risk Prevention Plan  Post Dischage Appt Complete  Medication Screening Complete  Transportation Screening Complete

## 2023-02-19 NOTE — Assessment & Plan Note (Signed)
Replaced orally. ?
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
## 2023-02-19 NOTE — Progress Notes (Signed)
NEUROLOGY CONSULT FOLLOW UP NOTE   Date of service: February 19, 2023 Patient Name: Jasmine Malone MRN:  [id number] DOB:  07-02-1945  Brief HPI  Jasmine Malone is a 77 y.o. female  has a past medical history of Arthritis, Asthma, Cancer, Cataracts, bilateral (05/10/2012), Dysplastic nevus (07/22/2021), Generalized anxiety disorder, GERD (gastroesophageal reflux disease), H. pylori infection, Heart murmur, Hepatitis, Hiatal hernia, Hyperlipidemia, Major depressive disorder, Parathyroid abnormality, PONV (postoperative nausea and vomiting), Post traumatic stress disorder (PTSD), and Tremor. who presented with worsening functional ability   Interval Hx/subjective   Has shown quite a bit of improvement with the Sinemet.  Moving in bed better.  Speaking better.  Thought process improved per family. Vitals   Vitals:   02/19/23 0448 02/19/23 0500 02/19/23 0816 02/19/23 1046  BP: (!) 143/64  (!) 159/68 (!) 146/62  Pulse: 68  60 65  Resp: 18  18 16   Temp: 98 F (36.7 C)  98.2 F (36.8 C) 97.9 F (36.6 C)  TempSrc: Oral   Oral  SpO2: 97%  97% 99%  Weight:  96.9 kg    Height:         Body mass index is 34.48 kg/m.  Physical Exam   Constitutional: Appears well-developed and well-nourished.  Psych: Affect appropriate to situation.  Eyes: No scleral injection.  Head: Normocephalic.  Cardiovascular: Normal rate and regular rhythm.  Respiratory: Effort normal, non-labored breathing.   Neurologic Examination   Mental Status: Alert, oriented, thought content appropriate.  Speech fluent without evidence of aphasia.  Able to follow 3 step commands without difficulty.   Cranial Nerves: II: Discs flat bilaterally; Visual fields grossly normal, pupils equal, round, reactive to light and accommodation III,IV, VI: ptosis not present, extra-ocular motions intact bilaterally V,VII: smile symmetric, facial light touch sensation normal bilaterally VIII: hearing normal bilaterally XI: bilateral  shoulder shrug XII: midline tongue extension Motor: 5/5 throughout.  Tone improved although left remains more increased than right.  No tremor noted.  Able to sit up in bed using bedrails unassisted.   Sensory: Pinprick and light touch intact throughout, bilaterally  Labs and Diagnostic Imaging   CBC:  Recent Labs  Lab 02/17/23 1340 02/18/23 0416 02/19/23 0522  WBC 7.5 8.3 8.6  NEUTROABS 4.3  --   --   HGB 13.8 14.2 14.3  HCT 41.8 40.8 41.7  MCV 97.9 92.7 95.2  PLT 115* 168 168    Basic Metabolic Panel:  Lab Results  Component Value Date   NA 139 02/19/2023   K 3.4 (L) 02/19/2023   CO2 27 02/19/2023   GLUCOSE 95 02/19/2023   BUN 18 02/19/2023   CREATININE 0.64 02/19/2023   CALCIUM 8.8 (L) 02/19/2023   GFRNONAA >60 02/19/2023   GFRAA >89 04/25/2013   Lipid Panel:  Lab Results  Component Value Date   LDLCALC 97 09/25/2014   HgbA1c:  Lab Results  Component Value Date   HGBA1C 5.5 04/25/2013   Urine Drug Screen: No results found for: "LABOPIA", "COCAINSCRNUR", "LABBENZ", "AMPHETMU", "THCU", "LABBARB"  Alcohol Level     Component Value Date/Time   ETH <10 02/17/2023 1340   INR  Lab Results  Component Value Date   INR 1.2 02/17/2023   APTT  Lab Results  Component Value Date   APTT 23 (L) 02/17/2023   AED levels: No results found for: "PHENYTOIN", "ZONISAMIDE", "LAMOTRIGINE", "LEVETIRACETA"   rEEG:  ABNORMALITY - Continuous slow, generalized   IMPRESSION: This study is suggestive of moderate diffuse encephalopathy. No seizures  or epileptiform discharges were seen throughout the recording.  Impression   Jasmine Malone is a 77 y.o. female with a past medical history of Arthritis, Asthma, Cancer, Cataracts, bilateral (05/10/2012), Dysplastic nevus (07/22/2021), Generalized anxiety disorder, GERD (gastroesophageal reflux disease), H. pylori infection, Heart murmur, Hepatitis, Hiatal hernia, Hyperlipidemia, Major depressive disorder, Parathyroid  abnormality, PONV (postoperative nausea and vomiting), Post traumatic stress disorder (PTSD), and Tremor presenting with decreased functional ability.  MRI of the brain shows no acute changes.  EEG only significant for slowing.  Patient has responded well to Sinemet.  Recommendations  Continue Sinemet at 25/100 BID Follow up with Dr. Arbutus Leas scheduled for next week  No further neurologic intervention is recommended at this time.  If further questions arise, please call or page at that time.  Thank you for allowing neurology to participate in the care of this patient.   ______________________________________________________________________   Thank you for the opportunity to take part in the care of this patient. If you have any further questions, please contact the neurology consultation team on call. Updated oncall schedule is listed on [facility]ION.  Signed,  Thana Farr, MD Neurology  02/19/2023  1:20 PM

## 2023-02-19 NOTE — Plan of Care (Signed)
  Problem: Education: Goal: Knowledge of General Education information will improve Description: Including pain rating scale, medication(s)/side effects and non-pharmacologic comfort measures Outcome: Progressing   Problem: Clinical Measurements: Goal: Ability to maintain clinical measurements within normal limits will improve Outcome: Progressing   Problem: Activity: Goal: Risk for activity intolerance will decrease Outcome: Progressing   Problem: Nutrition: Goal: Adequate nutrition will be maintained Outcome: Progressing   Problem: Coping: Goal: Level of anxiety will decrease Outcome: Progressing   Problem: Elimination: Goal: Will not experience complications related to bowel motility Outcome: Progressing   Problem: Pain Managment: Goal: General experience of comfort will improve Outcome: Progressing   Problem: Safety: Goal: Ability to remain free from injury will improve Outcome: Progressing   Problem: Skin Integrity: Goal: Risk for impaired skin integrity will decrease Outcome: Progressing   Problem: Education: Goal: Knowledge of disease or condition will improve Outcome: Progressing

## 2023-02-19 NOTE — Progress Notes (Signed)
Introduced patient to role of Statistician. Patient's husband, Jonny Ruiz and daughter both at bedside. Intake questions completed with permission from patient.  Patient does have a current PCP, who manages her mental health as well. Husband drives her to/from appointments. Denies any unmet SDOH needs at present.

## 2023-02-20 DIAGNOSIS — R413 Other amnesia: Secondary | ICD-10-CM | POA: Diagnosis not present

## 2023-02-20 DIAGNOSIS — G9341 Metabolic encephalopathy: Secondary | ICD-10-CM | POA: Diagnosis not present

## 2023-02-20 DIAGNOSIS — I5031 Acute diastolic (congestive) heart failure: Secondary | ICD-10-CM | POA: Diagnosis not present

## 2023-02-20 DIAGNOSIS — E876 Hypokalemia: Secondary | ICD-10-CM | POA: Diagnosis not present

## 2023-02-20 MED ORDER — CARBIDOPA-LEVODOPA 25-100 MG PO TABS: 1.0000 | ORAL_TABLET | Freq: Two times a day (BID) | ORAL | 0 refills | Status: DC

## 2023-02-20 MED ORDER — ENSURE ENLIVE PO LIQD: 237.0000 mL | Freq: Three times a day (TID) | ORAL | 0 refills | Status: AC

## 2023-02-20 NOTE — TOC Transition Note (Signed)
Transition of Care [facility]) - CM/SW Discharge Note   Patient Details  Name: Jasmine Malone MRN: [id number] Date of Birth: 07/18/1945  [facility] of [facility]) CM/SW Contact:  Liliana Cline, LCSW Phone Number: 02/20/2023, 9:25 AM   Clinical Narrative:    PT worked with patient this morning and is still recommending HH. DC today. Spoke to spouse by phone. He is agreeable to [facility], offered agency choice, no preference. Referral accepted by Elnita Maxwell with [facility].    Final next level of care: Home w Home Health Services Barriers to Discharge: Barriers Resolved   Patient Goals and CMS Choice CMS Medicare.gov Compare Post Acute Care list provided to:: Patient Choice offered to / list presented to : Patient, Spouse  Discharge Placement                    Name of family member notified: Jonny Ruiz - spouse Patient and family notified of of transfer: 02/20/23  Discharge Plan and Services Additional resources added to the After Visit Summary for                            [facility] Arranged: PT, OT, RN [facility] Agency: [facility] Date [facility] Agency Contacted: 02/20/23   Representative spoke with at [facility], [facility], [facility] Agency: Elnita Maxwell  Social Determinants of Health (SDOH) Interventions SDOH Screenings   Food Insecurity: No Food Insecurity (02/17/2023)  Housing: Low Risk  (02/17/2023)  Transportation Needs: No Transportation Needs (02/17/2023)  Utilities: Not At Risk (02/17/2023)  Depression (PHQ2-9): Low Risk  (10/10/2019)  Financial Resource Strain: Low Risk  (06/10/2022)   Received from [facility]  Tobacco Use: Low Risk  (02/17/2023)     Readmission Risk Interventions    02/19/2023   11:26 AM  Readmission Risk Prevention Plan  Post Dischage Appt Complete  Medication Screening Complete  Transportation Screening Complete

## 2023-02-20 NOTE — Plan of Care (Signed)
  Problem: Education: Goal: Knowledge of General Education information will improve Description: Including pain rating scale, medication(s)/side effects and non-pharmacologic comfort measures Outcome: Progressing   Problem: Activity: Goal: Risk for activity intolerance will decrease Outcome: Progressing   Problem: Nutrition: Goal: Adequate nutrition will be maintained Outcome: Progressing   Problem: Coping: Goal: Level of anxiety will decrease Outcome: Progressing   Problem: Pain Managment: Goal: General experience of comfort will improve Outcome: Progressing   Problem: Safety: Goal: Ability to remain free from injury will improve Outcome: Progressing   Problem: Skin Integrity: Goal: Risk for impaired skin integrity will decrease Outcome: Progressing   Problem: Education: Goal: Knowledge of disease or condition will improve Outcome: Progressing   Problem: Coping: Goal: Will identify appropriate support needs Outcome: Progressing   Problem: Self-Care: Goal: Ability to participate in self-care as condition permits will improve Outcome: Progressing   Problem: Nutrition: Goal: Risk of aspiration will decrease Outcome: Progressing

## 2023-02-20 NOTE — Discharge Summary (Signed)
Physician Discharge Summary   Patient: Jasmine Malone MRN: [id number] DOB: November 03, 1945  Admit date:     02/17/2023  Discharge date: 02/20/23  Discharge Physician: Alford Highland   PCP: Ethelda Chick, MD   Recommendations at discharge:   Follow-up with PCP 5 days Keep appointment with neurology Dr. Arbutus Leas  Discharge Diagnoses: Principal Problem:   Acute metabolic encephalopathy Active Problems:   Acute heart failure with preserved ejection fraction (HFpEF) (HCC)   Depression   HTN (hypertension)   Hyperlipidemia, unspecified   Thyroid cancer (HCC)   Non-obstructive CAD   Obesity (BMI 30-39.9)   Generalized weakness   Hypokalemia   Memory loss    Hospital Course: 77 y.o. 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    History obtained from patient's husband and daughter at bedside given patient's altered mental status.  Family states that over the last year, Jasmine Malone has had a gradual decline with shuffling gait and occasional difficulty finding her words but this has all seem to improve significantly after she started physical therapy.  Then over the last 4 days approximately, she has began to experience worsening shuffling gait, changes in her hand eye coordination and significant worsening inability to answer questions appropriately.  She seemed confused at times.  Then this morning, she was unable to follow any commands or answer any questions appropriately.  Due to this, they brought her to the ED.  10/11.  Patient states that she has been having trouble talking but cannot complete her symptoms completely.  Answers yes or no questions better.  Able to follow some simple commands.  MRI of the brain negative for  stroke.  Urine analysis and chest x-ray negative for infection.  EEG did not show any seizure.  Neurology started on Sinemet. 10/12.  Patient doing much better.  Mental status improved.  Able to talk better.  Neurology recommending going home on Sinemet and following up with neurology as outpatient. 10/13.  Patient feeling well today and offers no complaints.  Still has some short-term memory loss.  Patient able to communicate better than on admission.  Assessment and Plan: * Acute metabolic encephalopathy Mental status much improved from admission.  Able to hold a conversation and follow commands.  MRI of the brain negative for acute event.  Does not seem to be an infectious process.  Tickborne studies still pending.  EEG did not show seizure.  TSH, B12 and RPR all normal range.  Neurology started on a trial of Sinemet for possible Parkinson's disease or Parkinson's syndrome and patient's symptoms improved.  Neurology believed that her increased tone was affecting her ability to speak.  Patient will follow-up with Dr. Arbutus Leas neurology as outpatient.  Acute heart failure with preserved ejection fraction (HFpEF) (HCC) EF greater than 55% on echocardiogram at [facility].  Continue to monitor off medication.  I do not think this was acute heart failure because I did give the patient a fluid bolus while here.  Respiratory status stable the entire time.  Memory loss Follow-up as outpatient.  B12 level normal range, RPR nonreactive and TSH normal  Hypokalemia Replaced orally.  Generalized weakness Physical therapy recommending home with home health.  Obesity (BMI 30-39.9) BMI 37.01 which I do not think is accurate.  Non-obstructive CAD LHC on 10/0/2024 demonstrated mild nonobstructive CAD.  Thyroid cancer (HCC) TSH normal range continue Synthroid  Hyperlipidemia, unspecified On Lipitor  HTN (hypertension) Patient takes olmesartan at home and was on Avapro here.  Can go back on olmesartan at  home.  Depression On Prozac         Consultants: Neurology Procedures performed: None Disposition: Home health Diet recommendation:  Cardiac diet DISCHARGE MEDICATION: Allergies as of 02/20/2023       Reactions   Codeine Nausea Only   Can take Percocet with Zofran   Ibuprofen Other (See Comments)   Irritates stomach due to hiatal hernia   Shellfish Allergy Nausea Only        Medication List     STOP taking these medications    ondansetron 8 MG disintegrating tablet Commonly known as: Zofran ODT       TAKE these medications    albuterol 108 (90 Base) MCG/ACT inhaler Commonly known as: VENTOLIN HFA Inhale 2 puffs into the lungs every 6 (six) hours as needed for wheezing or shortness of breath (cough, shortness of breath or wheezing.).   aspirin EC 81 MG tablet Take 81 mg by mouth daily. Swallow whole.   atorvastatin 40 MG tablet Commonly known as: LIPITOR Take 40 mg by mouth daily.   carbidopa-levodopa 25-100 MG tablet Commonly known as: SINEMET IR Take 1 tablet by mouth 2 (two) times daily.   feeding supplement Liqd Take 237 mLs by mouth 3 (three) times daily between meals.   FENUGREEK PO Take 1 capsule by mouth daily as needed (for congestion).   FISH OIL PO Take 2 capsules by mouth daily.   FLUoxetine 20 MG tablet Commonly known as: PROZAC Take 20 mg by mouth daily.   Ginkgo Biloba 40 MG Tabs Take 40 mg by mouth daily.   levothyroxine 88 MCG tablet Commonly known as: SYNTHROID Take 88 mcg by mouth daily before breakfast.   MSM GLUCOSAMINE COMPLEX PO Take 1 capsule by mouth daily.   multivitamin with minerals Tabs tablet Take 1 tablet by mouth daily.   olmesartan 20 MG tablet Commonly known as: BENICAR Take 20 mg by mouth daily.   pantoprazole 40 MG tablet Commonly known as: PROTONIX TAKE 1 TABLET (40 MG TOTAL) BY MOUTH DAILY.        Follow-up Information     Ethelda Chick, MD Follow up in 5 day(s).   Specialty: Family  Medicine Contact information: [address] [place] [address] [phone]         TatOctaviano Batty, DO Follow up.   Specialty: Neurology Why: keep appointment Contact information: [address] [place]  [address] [place] [address] [phone]                Discharge Exam: Ceasar Mons Weights   02/17/23 1146 02/19/23 0500 02/20/23 0500  Weight: 99.8 kg 96.9 kg 104 kg   Physical Exam HENT:     Head: Normocephalic.     Mouth/Throat:     Pharynx: No oropharyngeal exudate.  Eyes:     General: Lids are normal.     Conjunctiva/sclera: Conjunctivae normal.  Cardiovascular:     Rate and Rhythm: Normal rate and regular rhythm.     Heart sounds: Normal heart sounds, S1 normal and S2 normal.  Pulmonary:     Breath sounds: No decreased breath sounds, wheezing, rhonchi or rales.  Abdominal:     Palpations: Abdomen is soft.     Tenderness: There is no abdominal tenderness.  Musculoskeletal:     Right  lower leg: No swelling.     Left lower leg: No swelling.  Skin:    General: Skin is warm.     Findings: No rash.  Neurological:     Mental Status: She is alert.     Comments: Talking better than on admission.  Able to hold a conversation.  Able to follow commands.  Able to straight leg raise without a problem.      Condition at discharge: stable  The results of significant diagnostics from this hospitalization (including imaging, microbiology, ancillary and laboratory) are listed below for reference.   Imaging Studies: EEG adult  Result Date: Mar 06, 2023 Charlsie Quest, MD     03-06-2023  1:12 PM Patient Name: ANDRESSA VESTER MRN: [id number] Epilepsy Attending: Charlsie Quest Referring Physician/Provider: Alford Highland, MD Date: March 06, 2023 Duration: 32.58 mins Patient history: 77yo F with ams getting eeg to evaluate for seizure Level of alertness: Awake/ lethargic AEDs during EEG study: None Technical aspects: This EEG study was done with scalp  electrodes positioned according to the 10-20 International system of electrode placement. Electrical activity was reviewed with band pass filter of 1-70Hz , sensitivity of 7 uV/mm, display speed of 5mm/sec with a 60Hz  notched filter applied as appropriate. EEG data were recorded continuously and digitally stored.  Video monitoring was available and reviewed as appropriate. Description: EEG showed continuous generalized 3 to 5 Hz theta-delta slowing, at times with triphasic morphology. Hyperventilation and photic stimulation were not performed.   ABNORMALITY - Continuous slow, generalized IMPRESSION: This study is suggestive of moderate diffuse encephalopathy. No seizures or epileptiform discharges were seen throughout the recording. Charlsie Quest   MR BRAIN WO CONTRAST  Result Date: 02/17/2023 CLINICAL DATA:  Altered mental status for 6 days EXAM: MRI HEAD WITHOUT CONTRAST TECHNIQUE: Multiplanar, multiecho pulse sequences of the brain and surrounding structures were obtained without intravenous contrast. COMPARISON:  Head CT from earlier today FINDINGS: Brain: No acute infarction, hemorrhage, hydrocephalus, extra-axial collection or mass lesion. FLAIR hyperintensity in the cerebral white matter with nonspecific pattern, usually chronic Daft vessel ischemia common overall mild for age. Brain volume is normal. T1 shortening at the globus pallidus, often attributed to liver failure. Vascular: Normal flow voids. Skull and upper cervical spine: Normal marrow signal. Sinuses/Orbits: Negative. IMPRESSION: 1. No acute finding. 2. Mild for age chronic white matter disease. 3. T1 hyperintensity in the basal ganglia, correlate for chronic liver failure. Electronically Signed   By: Tiburcio Pea M.D.   On: 02/17/2023 19:39   DG Chest 2 View  Result Date: 02/17/2023 CLINICAL DATA:  Weakness EXAM: CHEST - 2 VIEW COMPARISON:  None Available. FINDINGS: Enlarged cardiopericardial silhouette. Vascular congestion. No  pneumothorax or effusion. Overlapping cardiac leads. Degenerative changes along the spine. Films are under penetrated. IMPRESSION: Enlarged cardiopericardial silhouette with some vascular congestion. No pneumothorax or effusion. Overlapping cardiac leads. Electronically Signed   By: Karen Kays M.D.   On: 02/17/2023 15:00   CT HEAD WO CONTRAST  Result Date: 02/17/2023 CLINICAL DATA:  Neuro deficit, acute, stroke suspected EXAM: CT HEAD WITHOUT CONTRAST TECHNIQUE: Contiguous axial images were obtained from the base of the skull through the vertex without intravenous contrast. RADIATION DOSE REDUCTION: This exam was performed according to the departmental dose-optimization program which includes automated exposure control, adjustment of the mA and/or kV according to patient size and/or use of iterative reconstruction technique. COMPARISON:  None Available. FINDINGS: Brain: No evidence of acute infarction, hemorrhage, hydrocephalus, extra-axial collection or mass lesion/mass effect. Patchy white matter  hypodensities are nonspecific but compatible with chronic microvascular ischemic disease. Vascular: No hyperdense vessel identified. Skull: No acute fracture. Sinuses/Orbits: No acute finding. Other: No mastoid effusions. IMPRESSION: 1. No evidence of large vascular territory infarct or acute hemorrhage. An MRI could provide more sensitive evaluation for acute infarct if clinically warranted. 2. Chronic microvascular ischemic change. Electronically Signed   By: Feliberto Harts M.D.   On: 02/17/2023 14:59    Microbiology: Results for orders placed or performed during the hospital encounter of 02/17/23  Resp panel by RT-PCR (RSV, Flu A&B, Covid) Anterior Nasal Swab     Status: None   Collection Time: 02/17/23  1:40 PM   Specimen: Anterior Nasal Swab  Result Value Ref Range Status   SARS Coronavirus 2 by RT PCR NEGATIVE NEGATIVE Final    Comment: (NOTE) SARS-CoV-2 target nucleic acids are NOT  DETECTED.  The SARS-CoV-2 RNA is generally detectable in upper respiratory specimens during the acute phase of infection. The lowest concentration of SARS-CoV-2 viral copies this assay can detect is 138 copies/mL. A negative result does not preclude SARS-Cov-2 infection and should not be used as the sole basis for treatment or other patient management decisions. A negative result may occur with  improper specimen collection/handling, submission of specimen other than nasopharyngeal swab, presence of viral mutation(s) within the areas targeted by this assay, and inadequate number of viral copies(<138 copies/mL). A negative result must be combined with clinical observations, patient history, and epidemiological information. The expected result is Negative.  Fact Sheet for Patients:  BloggerCourse.com  Fact Sheet for Healthcare Providers:  SeriousBroker.it  This test is no t yet approved or cleared by the [place] FDA and  has been authorized for detection and/or diagnosis of SARS-CoV-2 by FDA under an Emergency Use Authorization (EUA). This EUA will remain  in effect (meaning this test can be used) for the duration of the COVID-19 declaration under Section 564(b)(1) of the Act, 21 U.S.C.section 360bbb-3(b)(1), unless the authorization is terminated  or revoked sooner.       Influenza A by PCR NEGATIVE NEGATIVE Final   Influenza B by PCR NEGATIVE NEGATIVE Final    Comment: (NOTE) The Xpert Xpress SARS-CoV-2/FLU/RSV plus assay is intended as an aid in the diagnosis of influenza from Nasopharyngeal swab specimens and should not be used as a sole basis for treatment. Nasal washings and aspirates are unacceptable for Xpert Xpress SARS-CoV-2/FLU/RSV testing.  Fact Sheet for Patients: BloggerCourse.com  Fact Sheet for Healthcare Providers: SeriousBroker.it  This test is not yet  approved or cleared by the [place] FDA and has been authorized for detection and/or diagnosis of SARS-CoV-2 by FDA under an Emergency Use Authorization (EUA). This EUA will remain in effect (meaning this test can be used) for the duration of the COVID-19 declaration under Section 564(b)(1) of the Act, 21 U.S.C. section 360bbb-3(b)(1), unless the authorization is terminated or revoked.     Resp Syncytial Virus by PCR NEGATIVE NEGATIVE Final    Comment: (NOTE) Fact Sheet for Patients: BloggerCourse.com  Fact Sheet for Healthcare Providers: SeriousBroker.it  This test is not yet approved or cleared by the [place] FDA and has been authorized for detection and/or diagnosis of SARS-CoV-2 by FDA under an Emergency Use Authorization (EUA). This EUA will remain in effect (meaning this test can be used) for the duration of the COVID-19 declaration under Section 564(b)(1) of the Act, 21 U.S.C. section 360bbb-3(b)(1), unless the authorization is terminated or revoked.  Performed at [facility], [phone]  [address] [place], [place] [address]     Labs: CBC: Recent Labs  Lab 02/17/23 1340 02/18/23 0416 02/19/23 0522  WBC 7.5 8.3 8.6  NEUTROABS 4.3  --   --   HGB 13.8 14.2 14.3  HCT 41.8 40.8 41.7  MCV 97.9 92.7 95.2  PLT 115* 168 168   Basic Metabolic Panel: Recent Labs  Lab 02/17/23 1340 02/18/23 0416 02/19/23 0522  NA 137 143 139  K 3.5 3.4* 3.4*  CL 105 104 103  CO2 22 26 27   GLUCOSE 108* 112* 95  BUN 14 12 18   CREATININE 0.60 0.58 0.64  CALCIUM 8.7* 8.9 8.8*   Liver Function Tests: Recent Labs  Lab 02/17/23 1340  AST 34  ALT 21  ALKPHOS 123  BILITOT 1.2  PROT 6.7  ALBUMIN 3.2*   CBG: Recent Labs  Lab 02/17/23 1153  GLUCAP 127*    Discharge time spent: greater than 30 minutes.  Signed: Alford Highland, MD [facility] 02/20/2023

## 2023-02-20 NOTE — Assessment & Plan Note (Signed)
Follow-up as outpatient.  B12 level normal range, RPR nonreactive and TSH normal

## 2023-02-20 NOTE — Plan of Care (Signed)
  Problem: Education: Goal: Knowledge of General Education information will improve Description: Including pain rating scale, medication(s)/side effects and non-pharmacologic comfort measures Outcome: Adequate for Discharge   Problem: Health Behavior/Discharge Planning: Goal: Ability to manage health-related needs will improve Outcome: Adequate for Discharge   Problem: Clinical Measurements: Goal: Ability to maintain clinical measurements within normal limits will improve Outcome: Adequate for Discharge Goal: Will remain free from infection Outcome: Adequate for Discharge Goal: Diagnostic test results will improve Outcome: Adequate for Discharge Goal: Respiratory complications will improve Outcome: Adequate for Discharge Goal: Cardiovascular complication will be avoided Outcome: Adequate for Discharge   Problem: Activity: Goal: Risk for activity intolerance will decrease Outcome: Adequate for Discharge   Problem: Nutrition: Goal: Adequate nutrition will be maintained Outcome: Adequate for Discharge   Problem: Coping: Goal: Level of anxiety will decrease Outcome: Adequate for Discharge   Problem: Elimination: Goal: Will not experience complications related to bowel motility Outcome: Adequate for Discharge Goal: Will not experience complications related to urinary retention Outcome: Adequate for Discharge   Problem: Pain Managment: Goal: General experience of comfort will improve Outcome: Adequate for Discharge   Problem: Safety: Goal: Ability to remain free from injury will improve Outcome: Adequate for Discharge   Problem: Skin Integrity: Goal: Risk for impaired skin integrity will decrease Outcome: Adequate for Discharge   Problem: Education: Goal: Knowledge of disease or condition will improve Outcome: Adequate for Discharge Goal: Knowledge of secondary prevention will improve (MUST DOCUMENT ALL) Outcome: Adequate for Discharge Goal: Knowledge of patient  specific risk factors will improve Loraine Leriche N/A or DELETE if not current risk factor) Outcome: Adequate for Discharge   Problem: Ischemic Stroke/TIA Tissue Perfusion: Goal: Complications of ischemic stroke/TIA will be minimized Outcome: Adequate for Discharge   Problem: Coping: Goal: Will identify appropriate support needs Outcome: Adequate for Discharge   Problem: Health Behavior/Discharge Planning: Goal: Ability to manage health-related needs will improve Outcome: Adequate for Discharge   Problem: Self-Care: Goal: Ability to participate in self-care as condition permits will improve Outcome: Adequate for Discharge Goal: Ability to communicate needs accurately will improve Outcome: Adequate for Discharge   Problem: Nutrition: Goal: Risk of aspiration will decrease Outcome: Adequate for Discharge

## 2023-02-20 NOTE — Progress Notes (Signed)
Physical Therapy Treatment Patient Details Name: Jasmine Malone MRN: [id number] DOB: 09-Jun-1945 Today's Date: 02/20/2023   History of Present Illness Patient is a 77 year old female with 4-5 day history of AMS, shuffling gait, word finding issues, and dysmetria. Recent hospitalization with left heart cath showing nonobstructive CAD. MRI of brain with no acute findings.    PT Comments  Pt ready for session.  To EOB with increased time and cues. Steady in sitting.  She needs verbal cues for hand placement and walker use throughout session.  Initially very short shuffling slow steps but she does improve to a more normal gait pattern with time and distance.  Practices transfer to toilet and then opening and closing bathroom door where she needs cues for walker use and hand placement as she tries to turn and walk with only one hand on walker.  She remained up in chair after for breakfast.  Encouraged to have +1 assist at home for safety.  Voiced understanding.  Discussed with MD and TOC,   If plan is discharge home, recommend the following: A little help with walking and/or transfers;A little help with bathing/dressing/bathroom;Assistance with cooking/housework;Direct supervision/assist for medications management;Direct supervision/assist for financial management;Assist for transportation;Help with stairs or ramp for entrance;Supervision due to cognitive status   Can travel by private vehicle        Equipment Recommendations  None recommended by PT (pt/pt's spouse endorse having all equipment needs met)    Recommendations for Other Services       Precautions / Restrictions Precautions Precautions: Fall Restrictions Weight Bearing Restrictions: No     Mobility  Bed Mobility Overal bed mobility: Needs Assistance Bed Mobility: Supine to Sit     Supine to sit: Contact guard     General bed mobility comments: increased time and cues Patient Response: Cooperative  Transfers Overall  transfer level: Needs assistance Equipment used: Rolling walker (2 wheels)   Sit to Stand: Contact guard assist           General transfer comment: cues for hand placements    Ambulation/Gait Ambulation/Gait assistance: Contact guard assist Gait Distance (Feet): 90 Feet Assistive device: Rolling walker (2 wheels) Gait Pattern/deviations: Step-through pattern, Decreased step length - right, Decreased step length - left, Shuffle Gait velocity: decreased     General Gait Details: initially very slow with shuffling steps but does improve with disance   Stairs             Wheelchair Mobility     Tilt Bed Tilt Bed Patient Response: Cooperative  Modified Rankin (Stroke Patients Only)       Balance Overall balance assessment: Needs assistance Sitting-balance support: Feet supported Sitting balance-Leahy Scale: Good     Standing balance support: Bilateral upper extremity supported Standing balance-Leahy Scale: Fair                              Cognition Arousal: Alert Behavior During Therapy: WFL for tasks assessed/performed Overall Cognitive Status: Impaired/Different from baseline Area of Impairment: Safety/judgement                                        Exercises      General Comments        Pertinent Vitals/Pain Pain Assessment Pain Assessment: No/denies pain    Home Living  Prior Function            PT Goals (current goals can now be found in the care plan section) Progress towards PT goals: Progressing toward goals    Frequency    Min 1X/week      PT Plan      Co-evaluation              AM-PAC PT "6 Clicks" Mobility   Outcome Measure  Help needed turning from your back to your side while in a flat bed without using bedrails?: A Little Help needed moving from lying on your back to sitting on the side of a flat bed without using bedrails?: A Little Help  needed moving to and from a bed to a chair (including a wheelchair)?: A Little Help needed standing up from a chair using your arms (e.g., wheelchair or bedside chair)?: A Little Help needed to walk in hospital room?: A Little Help needed climbing 3-5 steps with a railing? : A Little 6 Click Score: 18    End of Session Equipment Utilized During Treatment: Gait belt Activity Tolerance: Patient tolerated treatment well Patient left: in bed;with call bell/phone within reach;with bed alarm set;with family/visitor present Nurse Communication: Mobility status PT Visit Diagnosis: Difficulty in walking, not elsewhere classified (R26.2);Muscle weakness (generalized) (M62.81)     Time: 4098-1191 PT Time Calculation (min) (ACUTE ONLY): 14 min  Charges:    $Gait Training: 8-22 mins PT General Charges $$ ACUTE PT VISIT: 1 Visit                   Danielle Dess, PTA 02/20/23, 9:14 AM

## 2023-02-21 LAB — EHRLICHIA ANTIBODY PANEL
E chaffeensis (HGE) Ab, IgG: NEGATIVE
E chaffeensis (HGE) Ab, IgM: NEGATIVE
E. Chaffeensis (HME) IgM Titer: NEGATIVE
E.Chaffeensis (HME) IgG: NEGATIVE

## 2023-02-21 LAB — SPOTTED FEVER GROUP ANTIBODIES
Spotted Fever Group IgG: <1:64 {titer}
Spotted Fever Group IgM: <1:64 {titer}

## 2023-02-21 LAB — HEAVY METALS, BLOOD
Arsenic: 5 ug/L (ref 0–9)
Lead: <1 ug/dL (ref 0.0–3.4)
Mercury: <1 ug/L (ref 0.0–14.9)

## 2023-02-21 LAB — LYME DISEASE SEROLOGY W/REFLEX: Lyme Total Antibody EIA: NEGATIVE
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
## 2023-02-22 LAB — COPPER, SERUM
Copper: 96 ug/dL (ref 80–158)
Copper: 85 ug/dL (ref 80–158)

## 2023-02-23 ENCOUNTER — Encounter: Payer: Self-pay | Admitting: Neurology

## 2023-02-23 ENCOUNTER — Other Ambulatory Visit: Payer: Self-pay

## 2023-02-23 ENCOUNTER — Ambulatory Visit (INDEPENDENT_AMBULATORY_CARE_PROVIDER_SITE_OTHER): Payer: Medicare HMO | Admitting: Neurology

## 2023-02-23 VITALS — BP 126/74 | HR 64 | Ht 65.0 in | Wt 222.4 lb

## 2023-02-23 DIAGNOSIS — G20C Parkinsonism, unspecified: Secondary | ICD-10-CM

## 2023-02-23 DIAGNOSIS — R251 Tremor, unspecified: Secondary | ICD-10-CM

## 2023-02-23 DIAGNOSIS — R9402 Abnormal brain scan: Secondary | ICD-10-CM

## 2023-02-23 MED ORDER — CARBIDOPA-LEVODOPA 25-100 MG PO TABS: 1.0000 | ORAL_TABLET | Freq: Three times a day (TID) | ORAL | 1 refills | Status: DC

## 2023-02-23 NOTE — Patient Instructions (Addendum)
Increase carbidopa/levodopa 25/100 to 1 tablet at 8am/noon/4pm.   As a reminder, carbidopa/levodopa can be taken at the same time as a carbohydrate, but we like to have you take your pill either 30 minutes before a protein source or 1 hour after as protein can interfere with carbidopa/levodopa absorption.   We will do a DaT scan and schedule a skin biopsy.  The physicians and staff at [facility] are committed to providing excellent care. You may receive a survey requesting feedback about your experience at our office. We strive to receive "very good" responses to the survey questions. If you feel that your experience would prevent you from giving the office a "very good " response, please contact our office to try to remedy the situation. We may be reached at [phone]. Thank you for taking the time out of your busy day to complete the survey.

## 2023-03-08 ENCOUNTER — Telehealth: Payer: Self-pay

## 2023-03-09 ENCOUNTER — Ambulatory Visit (HOSPITAL_COMMUNITY)
Admission: RE | Admit: 2023-03-09 | Discharge: 2023-03-09 | Disposition: A | Discharge status: Home / Self Care | Payer: Medicare HMO | Source: Ambulatory Visit | Attending: Neurology | Admitting: Neurology

## 2023-03-09 DIAGNOSIS — R251 Tremor, unspecified: Secondary | ICD-10-CM | POA: Diagnosis present

## 2023-03-09 MED ORDER — POTASSIUM IODIDE (ANTIDOTE) 130 MG PO TABS
ORAL_TABLET | ORAL | Status: AC
  Filled 2023-03-09: qty 1

## 2023-03-09 MED ORDER — POTASSIUM IODIDE (ANTIDOTE) 130 MG PO TABS
130.0000 mg | ORAL_TABLET | Freq: Once | ORAL | Status: AC
  Administered 2023-03-09: 130 mg via ORAL

## 2023-03-09 MED ORDER — IOFLUPANE I 123 185 MBQ/2.5ML IV SOLN
4.6000 | Freq: Once | INTRAVENOUS | Status: AC | PRN
  Administered 2023-03-09: 4.6 via INTRAVENOUS

## 2023-03-14 DIAGNOSIS — I5032 Chronic diastolic (congestive) heart failure: Secondary | ICD-10-CM | POA: Diagnosis present

## 2023-03-14 DIAGNOSIS — I2510 Atherosclerotic heart disease of native coronary artery without angina pectoris: Secondary | ICD-10-CM | POA: Insufficient documentation

## 2023-03-18 ENCOUNTER — Ambulatory Visit (INDEPENDENT_AMBULATORY_CARE_PROVIDER_SITE_OTHER): Payer: Medicare HMO | Admitting: Neurology

## 2023-03-18 ENCOUNTER — Encounter: Payer: Self-pay | Admitting: Neurology

## 2023-03-18 VITALS — BP 122/72 | HR 58 | Ht 65.0 in

## 2023-03-18 DIAGNOSIS — G20C Parkinsonism, unspecified: Secondary | ICD-10-CM

## 2023-03-18 NOTE — Procedures (Signed)
Punch Biopsy Procedure Note  Preprocedure Diagnosis: Parkinsonism  Postprocedure Diagnosis: same  Locations: Site 1: Left posterior cervical Site 2: above, left knee;  Site 3: above, left foot  Indications: r/o alpha synucleinopathy  Anesthesia: 3 mL Lidocaine 1% with epinephrine without added sodium bicarbonate  Procedure Details Patient informed of the risks (including but not limited to bleeding, pain, infection, scar and infection) and benefits of the procedure.  Informed consent obtained.  The areas which were chosen for biopsy, as above, and surrounding areas were given a sterile prep using betadyne and draped in the usual sterile fashion. The skin was then stretched perpendicular to the skin tension lines and sample removed using the 3 mm punch. Pressure applied, hemostasis achieved.   Dressing applied. The specimen(s) was sent for pathologic examination. The patient tolerated the procedure well.  Estimated Blood Loss: 0 ml  Condition: Stable  Complications: none.  Plan: 1. Instructed to keep the wound dry and covered for 24-48h and clean thereafter. 2. Warning signs of infection were reviewed.

## 2023-03-22 ENCOUNTER — Telehealth: Payer: Self-pay

## 2023-04-13 ENCOUNTER — Telehealth: Payer: Self-pay | Admitting: Neurology

## 2023-04-13 NOTE — Telephone Encounter (Signed)
Called Pateint and she is taking medication and feels it is helping her. She said friends family and home health care can all tel a difference in her for the better

## 2023-04-13 NOTE — Telephone Encounter (Signed)
Pts skin biopsy came back.  There was:  evidence of alpha synuclein in the cutaneous nerves (posterior cervical and distal leg) 2.  evidence of Weinert fiber neuropathy 3.  No evidence of amyloid deposition within the cutaneous nerves  Please call pt/family and let them know that there was evidence of alpha synuclein in some of the biopsies we took. This is in contrast to her normal DaT scan.  For now, no changes to her medication and we will talk more at her next appt.

## 2023-05-26 ENCOUNTER — Other Ambulatory Visit: Payer: Self-pay | Admitting: Family Medicine

## 2023-05-26 DIAGNOSIS — E2839 Other primary ovarian failure: Secondary | ICD-10-CM
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
## 2023-08-25 ENCOUNTER — Encounter: Payer: Self-pay | Admitting: Neurology

## 2023-08-25 ENCOUNTER — Ambulatory Visit: Payer: Medicare HMO | Admitting: Neurology

## 2023-08-25 VITALS — BP 116/70 | HR 71 | Ht 65.0 in | Wt 226.4 lb

## 2023-08-25 DIAGNOSIS — R413 Other amnesia: Secondary | ICD-10-CM | POA: Diagnosis not present

## 2023-08-25 DIAGNOSIS — G20C Parkinsonism, unspecified: Secondary | ICD-10-CM | POA: Diagnosis not present

## 2023-08-25 NOTE — Patient Instructions (Signed)
 You have been referred for a neurocognitive evaluation (i.e., evaluation of memory and thinking abilities). Please bring someone with you to this appointment if possible, as it is helpful for the neuropsychologist to hear from both you and another adult who knows you well. Please bring eyeglasses and hearing aids if you wear them and take any medications as you normally would. Please fully abstain from all alcohol, marijuana, or other substances prior to your appointment.   The evaluation will take approximately 2-3 hours and has two parts:   The first part is a clinical interview with the neuropsychologist, Dr. Milbert Coulter or Dr. Robbie Lis. During the interview, the neuropsychologist will speak with you and the individual you brought to the appointment.    The second part of the evaluation is testing with the doctor's technician, aka psychometrician, Annabelle Harman or [employer]. During the testing, the technician will ask you to remember different types of material, solve problems, and answer some questionnaires. Your family member will not be present for this portion of the evaluation.   Please note: We have to reserve several hours of the neuropsychologist's time and the psychometrician's time for your evaluation appointment. As such, there is a No-Show fee of $100. If you are unable to attend any of your appointments, please contact our office as soon as possible to reschedule.

## 2023-08-26 ENCOUNTER — Other Ambulatory Visit: Payer: Self-pay | Admitting: Neurology

## 2023-08-26 DIAGNOSIS — R251 Tremor, unspecified: Secondary | ICD-10-CM

## 2023-08-26 DIAGNOSIS — G20C Parkinsonism, unspecified: Secondary | ICD-10-CM

## 2023-12-14 ENCOUNTER — Ambulatory Visit
Admission: RE | Admit: 2023-12-14 | Discharge: 2023-12-14 | Disposition: A | Discharge status: Home / Self Care | Source: Ambulatory Visit | Attending: Family Medicine | Admitting: Family Medicine

## 2023-12-14 DIAGNOSIS — Z1231 Encounter for screening mammogram for malignant neoplasm of breast: Secondary | ICD-10-CM | POA: Insufficient documentation

## 2023-12-14 DIAGNOSIS — E2839 Other primary ovarian failure: Secondary | ICD-10-CM

## 2023-12-23 DIAGNOSIS — G20B2 Parkinson's disease with dyskinesia, with fluctuations: Secondary | ICD-10-CM | POA: Insufficient documentation

## 2023-12-23 DIAGNOSIS — M1712 Unilateral primary osteoarthritis, left knee: Secondary | ICD-10-CM | POA: Insufficient documentation

## 2023-12-23 DIAGNOSIS — R2681 Unsteadiness on feet: Secondary | ICD-10-CM | POA: Insufficient documentation

## 2024-01-10 DIAGNOSIS — K5901 Slow transit constipation: Secondary | ICD-10-CM | POA: Insufficient documentation

## 2024-01-31 NOTE — Consults (Signed)
 ASSESSMENT     Ms. Fifield is a 78 y.o. female for whom inpatient neurology was consulted for speech changes and cognitive changes.  The patient initially presented with speech changes and decreased cognitive ability/confusion on 01/28/2024. Initial workup did not demonstrate findings on MRI brain to suggest acute ischemia. There was concern for a mild troponin elevation at admission, but other workup has been reassuring. Since being admitted, the patient's symptoms have largely resolved per her accord, and her mental status examination at the bedside is reassuring. Differential diagnosis includes encephalopathy in setting of cardiac dysfunction versus toxic/metabolic causes (such as UTI, as patient's UA was mildly abnormal) versus worsening of underlying cognitive processing (such as dementia, but considered less likely given patient's improvement).     PLAN   No further inpatient workup recommended from neurology Patient should follow up as outpatient for neurocognitive testing Patient should follow up with her outpatient neurologist after discharge Inpatient neurology team will sign off. We remain available if further questions arise in this patient's care.    Camellia Schwab, DO Assistant Professor Department of Neurology General Neurology and Neuromuscular Divisions [employer]  at [facility]     HISTORY    Chief Concern: KRISTL MORIOKA is a 78 y.o. female for whom inpatient neurology was consulted for speech changes and cognitive changes.   History of Present Illness: The history is obtained from the medical record, patient, and family, including her husband, who is here with the patient today.   Patient states she is in the hospital due to speech changes and confusion. She states she was having difficulty getting speech out on 01/27/2024. The initial H&P mentions patient was having speech finding difficulties, altered orientation compared to baseline, and some  weakness. After dinner on Friday, she became more confused from her baseline. When she woke the next morning, she was not as conversational and was not following commands. Notes mention a prior history of similar symptoms which improved with Sinemet  about a year prior. The prior presentation was also present with an NSTEMI, and patient had elevated troponins during this admission.   Further workup has demonstrated mildly abnormal urinalysis, no leukocytosis, and normal brain MRI ruling out stroke.  At this time, the patient states she has improved since she was admitted. She states she is conversational again. When asked about her speech, she estimates it is 90% of normal, but her husband estimates it is 75% of normal.   H&P mentions the patient is conversational at baseline and knows her name and the year and is able to follow commands. Further questioning reveals she is able to bathe herself (with her husband nearby just in case), dress herself, and feed herself. She does not help out with chores due to her symptoms.   Outside records and prior workup has been reviewed (see Media section).    Past Medical History: She  has a past medical history of Anisocoria, Difficult intravenous access, Dry eyes, Exercise-induced asthma (HHS-HCC), History of benign parathyroid tumor, HLD (hyperlipidemia), HTN (hypertension) (02/09/2023), NSTEMI (non-ST elevated myocardial infarction)    (CMS-HCC) (02/09/2023), PONV (postoperative nausea and vomiting), and Retinal detachment.  Family History: Her family history includes Diabetes in her brother; Retinal detachment in her brother.  Social history: She  reports that she has never smoked. She has never used smokeless tobacco. She reports that she does not currently use alcohol. She reports that she does not use drugs.   Medications: She is on the following medications:  Current  Medications[1]   Medication Reconciliation: In conjunction with entering the patient's  initial or transfer medications, I performed medication reconciliation based on information available to me at the time.  Allergies: She is allergic to sertraline, ibuprofen, codeine, and shellfish containing products.  Review of Systems: 11+ review of systems was reviewed. Please see scanned document for details. Significant positives and negatives are detailed in the HPI.   Data Review:   Laboratory Workup Urinalysis: trace ketones, 9 WBCs, 6 squamous epithelial cells, rare bacteria  Urine culture: 10000 to 50000 CFU/mL of Citrobacter koseri and mixed urogenital flora   Imaging Workup MRI Brain with and without Contrast (01/30/2024): No acute intracranial abnormality. The left cerebellar encephalomalacia reported in the prior head CT is likely artifactual.   Procedure Workup Echocardiogram (01/30/2024): The left ventricle is normal in size with normal wall thickness. The left ventricular systolic function is normal, LVEF is visually estimated at > 55%. There is grade I diastolic dysfunction (impaired relaxation). The right ventricle is normal in size, with normal systolic function.     EXAMINATION   Vital Signs :Her height is 165.1 cm (5' 5) and weight is 105.3 kg (232 lb 1.6 oz) (abnormal). Her oral temperature is 36.6 C (97.9 F). Her blood pressure is 117/57 and her pulse is 58. Her respiration is 18 and oxygen saturation is 97%.   Pain score-There were no vitals filed for this visit./10  Physical Examination: General: Well developed, well-nourished in no acute distress. HEENT: normocephalic, atraumatic, no oral lesions or tongue lacerations, mucous membranes are moist Ext: No edema, nontender, no ulcer. Warm, well perfused. There is no evidence of pes cavus or hammer toes.  Skin: No significant rashes. Neck: Supple.   Neurological Examination:  Mental Status:  Patient is awake, alert, and oriented to person, place [facility]), and time (month, year,  but not day of week). She is able to say the days of the week backwards. Attentive to examiner, cooperative with exam. Language is fluent with normal comprehension and repetition. Memory is decreased (delayed recall 1/3 without prompting, 3/3 with multiple choices). Patient is able to give details of her own history.  Speech is normal without any evidence of dysarthria.  Cranial nerves:   II: Visual fields are full. Pupils are equal, round, and reactive to light. III, IV and VI: extraocular movements are full. There is no nystagmus. V: Facial sensation is intact and symmetric. VII: The face is strong and symmetric. VIII: Hearing is intact to finger rub bilaterally. IX and X: Soft palate elevates symmetrically in the midline. XI: Shoulder shrug and sternocleidomastoids are strong. XII: Tongue is midline, normal movement, no atrophy or fasciculations.  Motor Examination:  Muscle tone is normal.  There is no evidence of atrophy or fasciculations.  Strength evaluation:    MUSCLE Right (MRC Grade) Left (MRC Grade)  Neck Flexor    Neck Extensor    Arm abductor 4+ 4+  Elbow extensor 5 5  Elbow flexor  4+ 4+  Wrist extensor 5 5  Wrist flexor 5 5  Finger flexor  5 5  Finger extensor 4+ 4+  Finger abduction 5 5  Thumb abduction 5 5  Hip flexor  4+ 4+  Hip extensor     Hip abduction    Hip adduction    Knee extensor  5 5  Knee flexor  5 5  Ank Dorsiflexor  5 5  Ank Plantarflexor  5 5  Foot Inversion    Foot Eversion  Toe Extensors 5 5  Toe Flexors 5 5  Other     Reflexes:  Reflexes Right Left Comments  Biceps 2+ 2+   Triceps 2+ 2+   Brachioradialis 2+ 2+   Patella 2+ 2+   Achilles 2+ 2+   Plantar Response Flexor Flexor   Hoffman's Sign Negative Negative    Sensory: Light touch sensation is normal in the upper and lower extremities.  Coordination and Gait: Finger-to-nose and heel-to-shin are intact without dysmetria.   Romberg sign deferred due to fall risk.  Gait  deferred due to fall risk.            [1]  Current Facility-Administered Medications:  .  acetaminophen  (TYLENOL ) tablet 650 mg, 650 mg, Oral, Q6H PRN, Lad, Saloni U, MD, 650 mg at 01/29/24 1654 .  carbidopa -levodopa  (SINEMET ) 25-100 mg per tablet 1 tablet, 1 tablet, Oral, TID, Auville, Kurt J, MD, 1 tablet at 01/31/24 [phone] .  cholecalciferol (vitamin D3 25 mcg (1,000 units)) tablet 25 mcg, 25 mcg, Oral, Daily, Auville, Kurt J, MD, 25 mcg at 01/31/24 0904 .  enoxaparin  (LOVENOX ) syringe 40 mg, 40 mg, Subcutaneous, Nightly, Auville, Kurt J, MD, 40 mg at 01/30/24 2102 .  FLUoxetine  (PROZAC ) capsule 20 mg, 20 mg, Oral, Daily, Auville, Kurt J, MD, 20 mg at 01/31/24 0904 .  levothyroxine  (SYNTHROID ) tablet 88 mcg, 88 mcg, Oral, daily, Auville, Kurt J, MD, 88 mcg at 01/31/24 0537 .  pantoprazole  (Protonix ) EC tablet 40 mg, 40 mg, Oral, Daily, Auville, Kurt J, MD, 40 mg at 01/31/24 0904 .  pravastatin (PRAVACHOL) tablet 20 mg, 20 mg, Oral, Nightly, Auville, Kurt J, MD, 20 mg at 01/30/24 2104 .  spironolactone (ALDACTONE) split tablet 12.5 mg, 12.5 mg, Oral, Daily, Lad, Saloni U, MD, 12.5 mg at 01/31/24 0904 .  valsartan (DIOVAN) tablet 40 mg, 40 mg, Oral, Daily, Lad, Saloni U, MD, 40 mg at 01/30/24 [phone]

## 2024-01-31 NOTE — Discharge Summary (Signed)
 ------------------------------------------------------------------------------- Attestation signed by Malone, Jasmine Heckler, MD at 02/01/24 1820 I saw and evaluated the patient, participating in the key portions of the service on the day of discharge.  I reviewed the resident's note and agree with the discharge plans and disposition.  I personally spent over 30 minutes in discharge planning services. Jasmine MARLA Gareth, MD  -------------------------------------------------------------------------------   Physician Discharge Summary HBR 1 BT2 [facility] [address] [place] Jasmine Malone Dept: [phone] Loc: [phone]   Identifying Information:  Jasmine Malone 31-Jan-1946 [id number]  Primary Care Physician: Claudene Rayfield Lunger, MD  Code Status: Full Code  Admit Date: 01/28/2024  Discharge Date: 01/31/2024   Discharge To: Home with Home Health and/or PT/OT  Discharge Service: HBR - [facility]   Discharge Attending Physician: No att. providers found  Discharge Diagnoses: Active Problems:   NSTEMI (non-ST elevated myocardial infarction)    (CMS-HCC)   HTN (hypertension)   HLD (hyperlipidemia)   Parkinsonism    (CMS-HCC)   Cognitive decline   Altered mental status   Outpatient Provider Follow Up Issues:  [ ]  outpatient referral for neuropsych testing per outpatient neurology   Hospital Course:    Jasmine Malone is a 78 y.o. female whose presentation is complicated by hypothyroidism secondary to thyroid cancer status post thyroidectomy, hypertension, hyperlipidemia, history of NSTEMI and CAD that presented with AMS and difficulty with producing speech.   # Altered mental status  History of parkinsonian Patient with altered mental status starting on 9/19 with speech finding difficulties altered orientation compared to baseline and some weakness. At baseline, A&Ox3. CT head without any acute intracranial abnormalities but did demonstrate encephalomalacia of left cerebellar  hemisphere likely secondary to prior stroke and Lucatero vessel ischemic changes. Low suspicion for infection, acute toxicity, electrolyte derangements, thyroid abnormalities. MRI demonstrates no acute intracranial abnormality. Suspect this is related to underlying neurologic process (parkinsons vs. Lewy body dementia) as she had a similar episode of confusion and lack of orientation one year ago that improved with carbidopa -levodopa  and she improved during this hospitalization without intervention. Home olmesartan restarted at lower dose of 10 mg daily given concern for orthostatic BP.   # Troponinemia  CAD # History of NSTEMI Most recent heart catheterization in 2024 demonstrates with 30% stenosis of left anterior descending artery with diffuse mild disease. Initial concern that her AMS was related to possible NSTEMI, given Had similar symptoms with confusion and lack of orientation a year ago preceded by NSTEMI, at that time improved with carbidopa -levodopa . Troponins initially elevated on admission, downtrended. Echo demonstrated EF >55% and grade I diastolic dysfunction. No chest pain during admission.   # Hyperkalemia (Resolved)  Patient presented to the emergency department with a potassium of 5.6, now improved. Home spironolactone and olmesartan was held on arrival. Olmesartan re-started at lower dose of 10mg  daily and spironolactone restarted on 9/22.   All other chronic medications were continued without changes.     Touchbase with Outpatient Provider: Warm Handoff: Completed on 01/31/24 by Harrie CHRISTELLA Dellen, MD  (Intern) via [facility] Message  Procedures: None No admission procedures for hospital encounter. ______________________________________________________________________ Discharge Medications:   Your Medication List     STOP taking these medications    fish oil-omega-3 fatty acids 300-1,000 mg capsule       CHANGE how you take these medications    olmesartan 20 MG  tablet Commonly known as: BENICAR Take 0.5 tablets (10 mg total) by mouth daily. What changed: how much to take  CONTINUE taking these medications    acetaminophen  650 MG CR tablet Commonly known as: TYLENOL  8 HOUR Take 1 tablet (650 mg total) by mouth once as needed for pain.   ALBUTEROL  INHL Inhale.   carbidopa -levodopa  25-100 mg per tablet Commonly known as: SINEMET  TAKE 1 TABLET BY MOUTH 3 (THREE) TIMES DAILY. 8AM/NOON/4PM   cholecalciferol (vitamin D3-50 mcg (2,000 unit)) 50 mcg (2,000 unit) Cap Take 1 capsule (50 mcg total) by mouth daily.   COQ10 SG 100 ORAL Take 10 mg by mouth in the morning.   estradiol 0.01 % (0.1 mg/gram) vaginal cream Commonly known as: ESTRACE Insert 2 g into the vagina.   FLUoxetine  20 MG capsule Commonly known as: PROZAC  Take 1 capsule (20 mg total) by mouth daily.   GLUCOSAMINE HCL-MSM ORAL Take 1 tablet by mouth nightly.   Levoxyl  88 MCG tablet Generic drug: levothyroxine  Take 1 tablet (88 mcg total) by mouth daily.   multivitamin capsule Take 1 capsule by mouth daily.   ondansetron  8 MG tablet Commonly known as: ZOFRAN  Take 1 tablet (8 mg total) by mouth every eight (8) hours as needed.   ondansetron  4 MG tablet Commonly known as: ZOFRAN  Take 1 tablet (4 mg total) by mouth every eight (8) hours as needed for nausea.   pantoprazole  40 MG tablet Commonly known as: Protonix  Take 40 mg by mouth daily.   pravastatin 20 MG tablet Commonly known as: PRAVACHOL 1 tablet (20 mg total) nightly.   spironolactone 25 MG tablet Commonly known as: ALDACTONE Take 0.5 tablets (12.5 mg total) by mouth daily.        Allergies: Sertraline, Ibuprofen, Codeine, and Shellfish containing products ______________________________________________________________________ Pending Test Results (if blank, then none):   Most Recent Labs: All lab results last 24 hours -  Recent Results (from the past 24 hours)  ECG 12 Lead    Collection Time: 01/30/24 11:10 PM  Result Value Ref Range   EKG Systolic BP  mmHg   EKG Diastolic BP  mmHg   EKG Ventricular Rate 63 BPM   EKG Atrial Rate 63 BPM   EKG P-R Interval 214 ms   EKG QRS Duration 110 ms   EKG Q-T Interval 494 ms   EKG QTC Calculation 505 ms   EKG Calculated P Axis 41 degrees   EKG Calculated R Axis 4 degrees   EKG Calculated T Axis 70 degrees   QTC Fredericia 502 ms  Basic metabolic panel   Collection Time: 01/31/24  7:44 AM  Result Value Ref Range   Sodium 144 135 - 145 mmol/L   Potassium 4.0 3.4 - 4.8 mmol/L   Chloride 106 98 - 107 mmol/L   CO2 26.4 20.0 - 31.0 mmol/L   Anion Gap 12 5 - 14 mmol/L   BUN 22 9 - 23 mg/dL   Creatinine 9.25 9.44 - 1.02 mg/dL   BUN/Creatinine Ratio 30    eGFR CKD-EPI (2021) Female 84 >=60 mL/min/1.59m2   Glucose 96 70 - 179 mg/dL   Calcium  8.7 8.7 - 10.4 mg/dL  CBC   Collection Time: 01/31/24  7:44 AM  Result Value Ref Range   WBC 9.5 3.6 - 11.2 10*9/L   RBC 3.44 (L) 3.95 - 5.13 10*12/L   HGB 11.9 11.3 - 14.9 g/dL   HCT 66.1 (L) 65.9 - 55.9 %   MCV 98.3 (H) 77.6 - 95.7 fL   MCH 34.6 (H) 25.9 - 32.4 pg   MCHC 35.2 32.0 - 36.0 g/dL   RDW  13.4 12.2 - 15.2 %   MPV 9.5 6.8 - 10.7 fL   Platelet 131 (L) 150 - 450 10*9/L    Relevant Studies/Radiology (if blank, then none): Echocardiogram W Colorflow Spectral Doppler With Contrast Result Date: 01/31/2024 Patient Info Name:     TAILEY TOP Age:     53 years DOB:     1945-12-08 Gender:     Female MRN:     [id number] Accession #:     [id number] UN Account #:     [id number] Ht:     165 cm Wt:     105 kg BSA:     2.25 m2 BP:     130 /     62 mmHg HR:     61 bpm Heart Rhythm:     Sinus Rhythm Exam Date:     01/30/2024 7:47 AM Admit Date:     01/28/2024 Exam Type:     ECHOCARDIOGRAM W COLORFLOW SPECTRAL DOPPLER W CONTRAST Technical Quality:     Fair Staff Sonographer:     Clotilda Haddock Reading Fellow:     Annalee HERO Pistiolis Ordering Physician:     Stacy FORBES Crock Study Info  Indications      - hfpef with trop inc ; Definity/Optison Procedure(s)   Complete two-dimensional, color flow and Doppler transthoracic echocardiogram is performed with contrast to opacify the left ventricle and to improve the delineation of the left ventricle endocardial borders. Ultrasound Enhancing Agent/Agitated Saline ------------------------------ UEA/Ag. Saline:     Optison Amount:     2.00 ml Existing IV Access:     Yes IV Access Condition:     patent with no signs of infiltration Summary   1. The left ventricle is normal in size with normal wall thickness.   2. The left ventricular systolic function is normal, LVEF is visually estimated at > 55%.   3. There is grade I diastolic dysfunction (impaired relaxation).   4. The right ventricle is normal in size, with normal systolic function. Left Ventricle   The left ventricle is normal in size with normal wall thickness. The left ventricular systolic function is normal, LVEF is visually estimated at > 55%. There is grade I diastolic dysfunction (impaired relaxation). Right Ventricle   The right ventricle is normal in size, with normal systolic function. Left Atrium   The left atrium is upper normal in size. Right Atrium   The right atrium is normal in size. Aortic Valve   The aortic valve is trileaflet with mildly thickened leaflets with normal excursion. There is trivial aortic regurgitation. There is no evidence of a significant transvalvular gradient. Mitral Valve   The mitral valve leaflets are normal with probably normal leaflet mobility. Mitral annular calcification is present (mild). There is no significant mitral valve regurgitation. Tricuspid Valve   The tricuspid valve leaflets are poorly visualized but probably normal, with probably normal leaflet mobility. There is no significant tricuspid regurgitation. The pulmonary systolic pressure cannot be estimated due to insufficient TR signal. Pulmonic Valve   Pulmonary valve is not well visualized. There is  no significant pulmonic regurgitation. There is no evidence of a significant transvalvular gradient. Aorta   The aorta is normal in size in the visualized segments. Inferior Vena Cava   IVC size and inspiratory change suggest normal right atrial pressure. (0-5 mmHg). Pericardium/Pleural   There is no pericardial effusion. Other Findings   Rhythm: Sinus Rhythm. Ventricles ---------------------------------------------------------------------- Name  Value        Normal ---------------------------------------------------------------------- LV Dimensions 2D/MM ----------------------------------------------------------------------  IVS Diastolic Thickness (2D)                                0.9 cm       0.6-0.9 LVID Diastole (2D)                  4.9 cm       3.8-5.2  LVPW Diastolic Thickness (2D)                                1.0 cm       0.6-0.9 LVID Systole (2D)                   3.5 cm       2.2-3.5 LV Mass Index (2D Cubed)           73 g/m2         43-95  Relative Wall Thickness (2D)                                  0.41        <=0.42 RV Dimensions 2D/MM ----------------------------------------------------------------------  RV Basal Diastolic Dimension                           3.5 cm       2.5-4.1 TAPSE                               3.0 cm         >=1.7 Atria ---------------------------------------------------------------------- Name                                 Value        Normal ---------------------------------------------------------------------- LA Dimensions ---------------------------------------------------------------------- LA Dimension (2D)                   3.9 cm       2.7-3.8 LA Volume Index (4C A-L)        33.11 ml/m2               LA Volume Index (2C A-L)        25.36 ml/m2               LA Volume (BP MOD)                   63 ml               LA Volume Index (BP MOD)        28.12 ml/m2   16.00-34.00 RA Dimensions  ---------------------------------------------------------------------- RA Area (4C)                      15.3 cm2        <=18.0 RA Area (4C) Index              6.8 cm2/m2               RA ESV Index (4C MOD)             19  ml/m2         15-27 Aortic Valve ---------------------------------------------------------------------- Name                                 Value        Normal ---------------------------------------------------------------------- AV Doppler ---------------------------------------------------------------------- AV Peak Velocity                   2.2 m/s               AV Peak Gradient                   20 mmHg Mitral Valve ---------------------------------------------------------------------- Name                                 Value        Normal ---------------------------------------------------------------------- MV Diastolic Function ---------------------------------------------------------------------- MV E Peak Velocity                 79 cm/s               MV A Peak Velocity                106 cm/s               MV E/A                                 0.7               MV Annular TDI ---------------------------------------------------------------------- MV Septal e' Velocity             5.4 cm/s         >=8.0 MV E/e' (Septal)                      14.4               MV Lateral e' Velocity            7.0 cm/s        >=10.0 MV E/e' (Lateral)                     11.3               MV e' Average                     6.2 cm/s               MV E/e' (Average)                     12.9 Tricuspid Valve ---------------------------------------------------------------------- Name                                 Value        Normal ---------------------------------------------------------------------- Estimated PAP/RSVP ---------------------------------------------------------------------- RA Pressure                         3 mmHg           <=5 Pulmonic Valve  ---------------------------------------------------------------------- Name  Value        Normal ---------------------------------------------------------------------- PV Doppler ---------------------------------------------------------------------- PV Peak Velocity                   1.4 m/s Aorta ---------------------------------------------------------------------- Name                                 Value        Normal ---------------------------------------------------------------------- Ascending Aorta ---------------------------------------------------------------------- Ao Root Diameter (2D)               3.1 cm               Ao Root Diam Index (2D)          1.4 cm/m2               Ascending Aorta Diameter            3.5 cm Venous ---------------------------------------------------------------------- Name                                 Value        Normal ---------------------------------------------------------------------- IVC/SVC ---------------------------------------------------------------------- IVC Diameter (Insp 2D)              0.4 cm               IVC Diameter (Exp 2D)               2.2 cm         <=2.1  IVC Diameter Percent Change (2D)                                  82 %          >=50 Report Signatures Finalized by Augustin LITTIE Gearing  MD on 01/31/2024 08:57 AM Resident Serafim M Pistiolis on 01/30/2024 11:28 AM  ECG 12 Lead Result Date: 01/31/2024 SINUS RHYTHM WITH 1ST DEGREE AV BLOCK POSSIBLE LATERAL INFARCT  (CITED ON OR BEFORE 28-Jan-2024) ABNORMAL ECG WHEN COMPARED WITH ECG OF 29-Jan-2024 11:38, NO SIGNIFICANT CHANGE WAS FOUND  MRI Brain W Wo Contrast Result Date: 01/30/2024 EXAM: Magnetic resonance imaging, brain, without and with contrast material. ACCESSION: [id number] UN CLINICAL INDICATION: 78 years old Female with 78 y/o hxt htn, HLD, thyroid cancer s/p thyroidectomy with chronic  left cerebellar stroke on CT head with AMS and speech difficulty   COMPARISON: CT head 01/28/2024. TECHNIQUE: Multiplanar, multisequence MR imaging of the brain was performed without and with I.V. contrast. FINDINGS:  Mild to moderate scattered periventricular and subcortical T2/FLAIR hyperintensities likely related to chronic microvascular changes. There is no definite evidence of acute or chronic infarction in the cerebellum. Mild diffuse parenchymal volume loss. Ventricles are normal in size. There is no midline shift. No extra-axial fluid collection. No evidence of intracranial hemorrhage. No diffusion weighted signal abnormality to suggest acute infarct. One microhemorrhage in the left cerebellum. No mass. There is no abnormal enhancement. Bilateral pseudophakia.   1.  No acute intracranial abnormality. 2.  The left cerebellar encephalomalacia reported in the prior head CT is likely artifactual.   ECG 12 Lead Result Date: 01/29/2024 SINUS RHYTHM WITH 1ST DEGREE AV BLOCK POSSIBLE LATERAL INFARCT  (CITED ON OR BEFORE 28-Jan-2024) ABNORMAL ECG WHEN COMPARED WITH ECG OF 29-Jan-2024 04:46, NO SIGNIFICANT CHANGE WAS FOUND  ECG 12 Lead Result Date: 01/29/2024  SINUS RHYTHM WITH 1ST DEGREE AV BLOCK BORDERLINE CRITERIA FOR INCOMPLETE RIGHT BUNDLE BRANCH BLOCK WHEN COMPARED WITH ECG OF 28-Jan-2024 19:38, NO SIGNIFICANT CHANGE WAS FOUND Confirmed by Vinie Dunnings [phone]) on 01/29/2024 8:40:01 AM  ECG 12 Lead Result Date: 01/29/2024 SINUS RHYTHM WITH 1ST DEGREE AV BLOCK INCOMPLETE RIGHT BUNDLE BRANCH BLOCK PROLONGED QT WHEN COMPARED WITH ECG OF 10-Feb-2023 10:03, RATE HAS INCREASED AND CRITERIA FOR INCOMPLETE RIGHT BUNDLE BRANCH BLOCK ARE NOW PRESENT Confirmed by Vinie Dunnings [phone]) on 01/29/2024 8:30:10 AM  ECG 12 Lead Result Date: 01/29/2024 SINUS RHYTHM WITH 1ST DEGREE AV BLOCK POSSIBLE LATERAL INFARCT  (CITED ON OR BEFORE 28-Jan-2024) ABNORMAL ECG WHEN COMPARED WITH ECG OF 29-Jan-2024 00:14, NO SIGNIFICANT CHANGE WAS FOUND  ECG 12 Lead Result Date: 01/29/2024 SINUS  RHYTHM WITH 1ST DEGREE AV BLOCK LATERAL INFARCT  (CITED ON OR BEFORE 28-Jan-2024) ABNORMAL ECG WHEN COMPARED WITH ECG OF 28-Jan-2024 20:50, NO SIGNIFICANT CHANGE WAS FOUND  XR Chest 2 views Result Date: 01/29/2024 EXAM: XR CHEST 2 VIEWS ACCESSION: [id number] UN REPORT DATE: 01/28/2024 11:56 PM CLINICAL INDICATION: CHEST PAIN  TECHNIQUE: PA and Lateral Chest Radiographs COMPARISON: Chest radiograph 02/09/2023, other FINDINGS: Hypoinflated lungs with bibasilar bronchovascular crowding. No pleural effusion. No pneumothorax. Prominent mediastinal contours. No acute osseous abnormality. Bilateral acromioclavicular osteoarthrosis. Diffuse idiopathic skeletal hyperostosis (DISH), evidenced by multilevel paravertebral enthesial osseous bridging of multiple consecutive vertebral bodies spanning the lower thoracic to upper lumbar spine. Synostosis between the right first and second ribs. Stepwise anterolisthesis at multiple consecutive levels in the partially imaged cervical spine. Osteopenia.   No acute cardiopulmonary disease.   CT Head Wo Contrast Result Date: 01/28/2024 EXAM: Computed tomography, head or brain without contrast material. ACCESSION: [id number] UN CLINICAL INDICATION: 78 years old Female with confusion  COMPARISON: CT head 12/13/2020 TECHNIQUE: Axial CT images of the head from skull base to vertex without contrast. FINDINGS: Encephalomalacia of the left cerebellar hemisphere, likely secondary to prior infarct. There are scattered hypodense foci within the periventricular and deep white matter.  These are nonspecific but commonly associated with Ells vessel ischemic changes. There is no midline shift. No mass lesion. There is no evidence of acute infarct.  The sinuses are pneumatized. Bilateral pseudophakia and left scleral buckle. No intracranial hemorrhage or skull fractures.   No acute intracranial abnormality.   ______________________________________________________________________ Discharge  Instructions:  Activity Instructions     Activity as tolerated           Other Instructions     Discharge instructions     You were admitted to [facility] for confusion and changes in your speech. We imaged your brain and it showed no new concerning findings. We decreased the dose of your olmesartan to 10mg  daily because your blood pressures were on the lower side during hospitalization. All of your other medications stayed the same. Please follow up with your neurologist outpatient. They will do more testing to determined what caused this episode of confusion/speech changes. Your speech has improved and you are back at your baseline mental status. You are ready to go home and we will set up home PT and OT for you.   Please carefully read and follow these instructions below upon your discharge:  1) Please take your medications as prescribed and note the changes listed on your discharge. At future follow-up appointments, please be sure to take all of your medications with you so your provider can better guide your care.   2) Seek medical care with your primary care  doctor or local Emergency Room or Urgent Care if you develop any changes in your mental status, worsening abdominal pain, fevers greater than 101.5, any unexplained/unrelieved shortness of breath, uncontrolled nausea and vomiting that keeps you from remaining hydrated or taking your medication, or any other concerning symptoms.   3) Please go to your follow-up appointments. Some of your follow-up appointments have been listed below. If you do not see an appointment listed below with your primary care doctor, please call your doctor's office as soon as possible to schedule an appointment to be seen within 7-10 days of discharge.   4) If you have any concerns before you are able to follow-up with your primary care doctor.          Follow Up instructions and Outpatient Referrals    Discharge  instructions       Appointments which have been scheduled for you    May 24, 2024 12:55 PM (Arrive by 12:30 PM) RETURN MOVEMENT with Powell JINNY Shaver, FNP [facility] NEUROLOGY CLINIC [address] [place] [facility] REGION) [address] [place] [address] [phone]     Dec 25, 2024 9:00 AM (Arrive by 8:45 AM) [facility] NEUROPSYCH TESTING with Donnice Cordella Lesches, PhD [facility] PHYSICAL MEDICINE [address] [place] [facility] REGION) [phone] [address] [place] [address] [phone]     Feb 12, 2025 2:30 PM (Arrive by 2:05 PM) RETURN MOVEMENT with Eva Pong, MD [facility] NEUROLOGY CLINIC [address] [place] [facility] REGION) [address] [place] [address] [phone]        ______________________________________________________________________ Discharge Day Services: BP 107/63   Pulse 77   Temp 36.6 C (97.9 F) (Oral)   Resp 18   Ht 165.1 cm (5' 5)   Wt (!) 105.3 kg (232 lb 1.6 oz)   SpO2 97%   BMI 38.62 kg/m  Pt seen on the day of discharge and determined appropriate for discharge.  GEN: well appearing, lying in bed, NAD  HEENT: NCAT, MMM. EOMI, chronic left eyelid droop  Neck: Supple. CV: Regular rate and rhythm. Systolic murmur present.  Pulm: CTAB. No wheezing, crackles, or rhonchi. Abd: Flat.  Nontender. No guarding, rebound.  Normoactive bowel sounds.   Neuro: A&O x 3. No focal deficits.  Ext: No peripheral edema.  Palpable distal pulses.  Condition at Discharge: good  Length of Discharge: I spent greater than 30 mins in the discharge of this patient.

## 2024-02-04 ENCOUNTER — Observation Stay
Admission: EM | Admit: 2024-02-04 | Discharge: 2024-02-06 | Disposition: A | Discharge status: Home / Self Care | Attending: Student | Admitting: Student

## 2024-02-04 ENCOUNTER — Other Ambulatory Visit: Payer: Self-pay

## 2024-02-04 ENCOUNTER — Emergency Department

## 2024-02-04 DIAGNOSIS — K219 Gastro-esophageal reflux disease without esophagitis: Secondary | ICD-10-CM | POA: Diagnosis present

## 2024-02-04 DIAGNOSIS — K862 Cyst of pancreas: Secondary | ICD-10-CM | POA: Diagnosis not present

## 2024-02-04 DIAGNOSIS — J45909 Unspecified asthma, uncomplicated: Secondary | ICD-10-CM | POA: Diagnosis not present

## 2024-02-04 DIAGNOSIS — F32A Depression, unspecified: Secondary | ICD-10-CM | POA: Diagnosis not present

## 2024-02-04 DIAGNOSIS — N3000 Acute cystitis without hematuria: Secondary | ICD-10-CM | POA: Diagnosis not present

## 2024-02-04 DIAGNOSIS — G9341 Metabolic encephalopathy: Secondary | ICD-10-CM | POA: Diagnosis not present

## 2024-02-04 DIAGNOSIS — Z7982 Long term (current) use of aspirin: Secondary | ICD-10-CM | POA: Insufficient documentation

## 2024-02-04 DIAGNOSIS — I110 Hypertensive heart disease with heart failure: Secondary | ICD-10-CM | POA: Diagnosis not present

## 2024-02-04 DIAGNOSIS — K449 Diaphragmatic hernia without obstruction or gangrene: Secondary | ICD-10-CM | POA: Diagnosis not present

## 2024-02-04 DIAGNOSIS — N390 Urinary tract infection, site not specified: Principal | ICD-10-CM | POA: Diagnosis present

## 2024-02-04 DIAGNOSIS — E785 Hyperlipidemia, unspecified: Secondary | ICD-10-CM | POA: Diagnosis not present

## 2024-02-04 DIAGNOSIS — Z79899 Other long term (current) drug therapy: Secondary | ICD-10-CM | POA: Insufficient documentation

## 2024-02-04 DIAGNOSIS — F10920 Alcohol use, unspecified with intoxication, uncomplicated: Secondary | ICD-10-CM | POA: Insufficient documentation

## 2024-02-04 DIAGNOSIS — K5791 Diverticulosis of intestine, part unspecified, without perforation or abscess with bleeding: Secondary | ICD-10-CM | POA: Diagnosis not present

## 2024-02-04 DIAGNOSIS — C73 Malignant neoplasm of thyroid gland: Secondary | ICD-10-CM | POA: Diagnosis not present

## 2024-02-04 DIAGNOSIS — R251 Tremor, unspecified: Secondary | ICD-10-CM | POA: Diagnosis present

## 2024-02-04 DIAGNOSIS — R400 Somnolence: Principal | ICD-10-CM

## 2024-02-04 DIAGNOSIS — I5032 Chronic diastolic (congestive) heart failure: Secondary | ICD-10-CM | POA: Diagnosis present

## 2024-02-04 DIAGNOSIS — R4182 Altered mental status, unspecified: Secondary | ICD-10-CM | POA: Diagnosis present

## 2024-02-04 DIAGNOSIS — I10 Essential (primary) hypertension: Secondary | ICD-10-CM | POA: Diagnosis present

## 2024-02-04 LAB — COMPREHENSIVE METABOLIC PANEL WITH GFR
ALT: 26 U/L (ref 0–44)
AST: 29 U/L (ref 15–41)
Albumin: 3.1 g/dL — ABNORMAL LOW (ref 3.5–5.0)
Alkaline Phosphatase: 74 U/L (ref 38–126)
Anion gap: 9 (ref 5–15)
BUN: 21 mg/dL (ref 8–23)
CO2: 25 mmol/L (ref 22–32)
Calcium: 8.7 mg/dL — ABNORMAL LOW (ref 8.9–10.3)
Chloride: 108 mmol/L (ref 98–111)
Creatinine, Ser: 0.86 mg/dL (ref 0.44–1.00)
GFR, Estimated: >60 mL/min (ref >60)
Glucose, Bld: 105 mg/dL — ABNORMAL HIGH (ref 70–99)
Potassium: 3.9 mmol/L (ref 3.5–5.1)
Sodium: 142 mmol/L (ref 135–145)
Total Bilirubin: 1 mg/dL (ref 0.0–1.2)
Total Protein: 6.6 g/dL (ref 6.5–8.1)

## 2024-02-04 LAB — CBC
HCT: 36 % (ref 36.0–46.0)
Hemoglobin: 12.3 g/dL (ref 12.0–15.0)
MCH: 34.1 pg — ABNORMAL HIGH (ref 26.0–34.0)
MCHC: 34.2 g/dL (ref 30.0–36.0)
MCV: 99.7 fL (ref 80.0–100.0)
Platelets: 181 K/uL (ref 150–400)
RBC: 3.61 MIL/uL — ABNORMAL LOW (ref 3.87–5.11)
RDW: 12.5 % (ref 11.5–15.5)
WBC: 8.5 K/uL (ref 4.0–10.5)
nRBC: 0 % (ref 0.0–0.2)

## 2024-02-04 LAB — URINALYSIS, ROUTINE W REFLEX MICROSCOPIC
Bilirubin Urine: NEGATIVE
Glucose, UA: NEGATIVE mg/dL
Ketones, ur: NEGATIVE mg/dL
Nitrite: POSITIVE — AB
Protein, ur: NEGATIVE mg/dL
Specific Gravity, Urine: 1.009 (ref 1.005–1.030)
pH: 7 (ref 5.0–8.0)

## 2024-02-04 LAB — TROPONIN I (HIGH SENSITIVITY): Troponin I (High Sensitivity): 26 ng/L — ABNORMAL HIGH (ref <18)

## 2024-02-04 MED ORDER — ONDANSETRON HCL 4 MG/2ML IJ SOLN
4.0000 mg | INTRAMUSCULAR | Status: AC
  Administered 2024-02-04: 4 mg via INTRAVENOUS
  Filled 2024-02-04: qty 2

## 2024-02-04 MED ORDER — ENOXAPARIN SODIUM 60 MG/0.6ML IJ SOSY
0.5000 mg/kg | PREFILLED_SYRINGE | INTRAMUSCULAR | Status: DC
  Administered 2024-02-04: 57.5 mg via SUBCUTANEOUS
  Filled 2024-02-04: qty 0.6

## 2024-02-04 MED ORDER — IOHEXOL 300 MG/ML  SOLN
100.0000 mL | Freq: Once | INTRAMUSCULAR | Status: AC | PRN
  Administered 2024-02-04: 100 mL via INTRAVENOUS

## 2024-02-04 MED ORDER — ACETAMINOPHEN 325 MG PO TABS
650.0000 mg | ORAL_TABLET | Freq: Four times a day (QID) | ORAL | Status: DC | PRN
  Administered 2024-02-05 (×2): 650 mg via ORAL
  Filled 2024-02-04 (×2): qty 2

## 2024-02-04 MED ORDER — SODIUM CHLORIDE 0.9% FLUSH: 3.0000 mL | INTRAVENOUS | Status: DC | PRN

## 2024-02-04 MED ORDER — SODIUM CHLORIDE 0.9 % IV SOLN
1.0000 g | INTRAVENOUS | Status: AC
  Administered 2024-02-04: 1 g via INTRAVENOUS
  Filled 2024-02-04: qty 10

## 2024-02-04 MED ORDER — CARBIDOPA-LEVODOPA 25-100 MG PO TABS
1.0000 | ORAL_TABLET | Freq: Three times a day (TID) | ORAL | Status: DC
  Administered 2024-02-04 – 2024-02-06 (×5): 1 via ORAL
  Filled 2024-02-04 (×6): qty 1

## 2024-02-04 MED ORDER — ENOXAPARIN SODIUM 40 MG/0.4ML IJ SOSY: 40.0000 mg | PREFILLED_SYRINGE | INTRAMUSCULAR | Status: DC

## 2024-02-04 MED ORDER — POLYETHYLENE GLYCOL 3350 17 G PO PACK: 17.0000 g | PACK | Freq: Every day | ORAL | Status: DC | PRN

## 2024-02-04 MED ORDER — ACETAMINOPHEN 650 MG RE SUPP: 650.0000 mg | Freq: Four times a day (QID) | RECTAL | Status: DC | PRN

## 2024-02-04 MED ORDER — ALBUTEROL SULFATE (2.5 MG/3ML) 0.083% IN NEBU: 2.5000 mg | INHALATION_SOLUTION | Freq: Four times a day (QID) | RESPIRATORY_TRACT | Status: DC | PRN

## 2024-02-04 MED ORDER — METOPROLOL TARTRATE 5 MG/5ML IV SOLN: 5.0000 mg | Freq: Four times a day (QID) | INTRAVENOUS | Status: DC | PRN

## 2024-02-04 MED ORDER — ENSURE ENLIVE PO LIQD
237.0000 mL | Freq: Three times a day (TID) | ORAL | Status: DC
  Administered 2024-02-04 – 2024-02-06 (×6): 237 mL via ORAL
  Filled 2024-02-04: qty 237

## 2024-02-04 MED ORDER — ADULT MULTIVITAMIN W/MINERALS CH
1.0000 | ORAL_TABLET | Freq: Every day | ORAL | Status: DC
  Administered 2024-02-04 – 2024-02-06 (×3): 1 via ORAL
  Filled 2024-02-04 (×3): qty 1

## 2024-02-04 MED ORDER — SODIUM CHLORIDE 0.9 % IV SOLN: 250.0000 mL | INTRAVENOUS | Status: AC | PRN

## 2024-02-04 MED ORDER — LEVOTHYROXINE SODIUM 88 MCG PO TABS
88.0000 ug | ORAL_TABLET | Freq: Every day | ORAL | Status: DC
  Administered 2024-02-05 – 2024-02-06 (×2): 88 ug via ORAL
  Filled 2024-02-04 (×2): qty 1

## 2024-02-04 MED ORDER — IRBESARTAN 150 MG PO TABS
150.0000 mg | ORAL_TABLET | Freq: Every day | ORAL | Status: DC
  Administered 2024-02-04 – 2024-02-06 (×3): 150 mg via ORAL
  Filled 2024-02-04 (×4): qty 1

## 2024-02-04 MED ORDER — OMEGA-3-ACID ETHYL ESTERS 1 G PO CAPS
2.0000 g | ORAL_CAPSULE | Freq: Every day | ORAL | Status: DC
  Administered 2024-02-06: 2 g via ORAL
  Filled 2024-02-04 (×3): qty 2

## 2024-02-04 MED ORDER — ASPIRIN 81 MG PO TBEC
81.0000 mg | DELAYED_RELEASE_TABLET | Freq: Every day | ORAL | Status: DC
  Administered 2024-02-06: 81 mg via ORAL
  Filled 2024-02-04 (×2): qty 1

## 2024-02-04 MED ORDER — PANTOPRAZOLE SODIUM 40 MG PO TBEC
40.0000 mg | DELAYED_RELEASE_TABLET | Freq: Every day | ORAL | Status: DC
  Administered 2024-02-04 – 2024-02-06 (×3): 40 mg via ORAL
  Filled 2024-02-04 (×3): qty 1

## 2024-02-04 MED ORDER — SODIUM CHLORIDE 0.9% FLUSH
3.0000 mL | Freq: Two times a day (BID) | INTRAVENOUS | Status: DC
  Administered 2024-02-04 – 2024-02-06 (×4): 3 mL via INTRAVENOUS

## 2024-02-04 MED ORDER — FLUOXETINE HCL 20 MG PO CAPS
20.0000 mg | ORAL_CAPSULE | Freq: Every day | ORAL | Status: DC
  Administered 2024-02-04 – 2024-02-06 (×3): 20 mg via ORAL
  Filled 2024-02-04 (×3): qty 1

## 2024-02-04 MED ORDER — CEFTRIAXONE SODIUM 1 G IJ SOLR
1.0000 g | INTRAMUSCULAR | Status: DC
  Administered 2024-02-05: 1 g via INTRAVENOUS
  Filled 2024-02-04 (×2): qty 10

## 2024-02-04 MED ORDER — SODIUM CHLORIDE 0.9 % IV BOLUS
500.0000 mL | Freq: Once | INTRAVENOUS | Status: AC
  Administered 2024-02-04: 500 mL via INTRAVENOUS

## 2024-02-04 NOTE — ED Notes (Signed)
 Family at bedside.

## 2024-02-04 NOTE — Assessment & Plan Note (Signed)
 Met down hairstyle continue PPI

## 2024-02-04 NOTE — Progress Notes (Signed)
 PHARMACIST - PHYSICIAN COMMUNICATION  CONCERNING:  Enoxaparin  (Lovenox ) for DVT Prophylaxis    RECOMMENDATION: Patient was prescribed enoxaprin 40mg  q24 hours for VTE prophylaxis.   Filed Weights   02/04/24 1701  Weight: 113.2 kg (249 lb 9 oz)    Body mass index is 41.53 kg/m.  Estimated Creatinine Clearance: 69.8 mL/min (by C-G formula based on SCr of 0.86 mg/dL).   Based on [facility] policy patient is candidate for enoxaparin  0.5mg /kg TBW SQ every 24 hours based on BMI being >30.     DESCRIPTION: Pharmacy has adjusted enoxaparin  dose per [facility] policy.  Patient is now receiving enoxaparin  57.5 mg every 24 hours    Olam KANDICE Fritter, PharmD Clinical Pharmacist  02/04/2024 8:29 PM

## 2024-02-04 NOTE — Hospital Course (Signed)
 Patient is a 78 year old with history of Lewy body dementia, GERD, depression, HTN, HLD, history of thyroid cancer, history of CHFpEF, obesity who was recently admitted to [facility] with altered mental status and had a fairly lengthy stay and workup there.  Patient was noted to have some elevated troponins and had consultation with neurology who felt like she had an acute metabolic cause versus worsening dementia since she seemed to regain her mental status at that time.  She was doing well for the last 5 days at home until last evening when she had an episode of emesis and again seem to have lost her memory.  Today she had generalized weakness and the inability to ambulate was brought to the ED.  In the ED she had a negative CT of the head and abdomen labs were essentially normal except for urinalysis which was indicative of probable UTI with nitrates moderate leuks and many bacteria.  Her last urine culture grew Citrobacter.

## 2024-02-04 NOTE — H&P (Signed)
 History and Physical    Patient: Jasmine Malone FMW:[id number] DOB: 10/11/1945 DOA: 02/04/2024 DOS: the patient was seen and examined on 02/04/2024 PCP: Claudene Rayfield HERO, MD  Patient coming from: Home  Chief Complaint:  Chief Complaint  Patient presents with   Altered Mental Status   HPI: Jasmine Malone is a 78 y.o. female with medical history significant of Lewy body dementia, GERD, depression, HTN, HLD, history of thyroid cancer, history of CHFpEF, obesity who was recently admitted to [facility] with altered mental status and had a fairly lengthy stay and workup there.  Patient was noted to have some elevated troponins and had consultation with neurology who felt like she had an acute metabolic cause versus worsening dementia since she seemed to regain her mental status at that time.  She was doing well for the last 5 days at home until last evening when she had an episode of emesis and again seem to have lost her memory.  Today she had generalized weakness and the inability to ambulate was brought to the ED.  In the ED she had a negative CT of the head and abdomen labs were essentially normal except for urinalysis which was indicative of probable UTI with nitrates moderate leuks and many bacteria.  Her last urine culture grew Citrobacter.  Review of Systems: unable to review all systems due to the inability of the patient to answer questions. Past Medical History:  Diagnosis Date   Arthritis    Asthma    Albuterol  use prior to exercise.  No hospitalizations or ED visits.   Cancer    Squamous cell cancer   Cataracts, bilateral 05/10/2012   s/p B cataract resection.   Dysplastic nevus 07/22/2021   spinal upper back, mild   Generalized anxiety disorder    GERD (gastroesophageal reflux disease)    H. pylori infection    Heart murmur    Hepatitis    Type A as a child   Hiatal hernia    Hyperlipidemia    Major depressive disorder    Followed by therapist once per month; Nan Hyball in [facility]    Parathyroid abnormality    PONV (postoperative nausea and vomiting)    Post traumatic stress disorder (PTSD)    Tremor    Past Surgical History:  Procedure Laterality Date   CATARACT EXTRACTION, BILATERAL     DILATATION & CURETTAGE/HYSTEROSCOPY WITH TRUECLEAR N/A 02/20/2014   Procedure: DILATATION & CURETTAGE/HYSTEROSCOPY WITH TRUCLEAR;  Surgeon: Charlie HERO Croak, MD;  Location: [facility];  Service: Gynecology;  Laterality: N/A;   LAPAROSCOPIC CHOLECYSTECTOMY  1998   PARATHYROIDECTOMY  1997   3 resected.     RETINAL DETACHMENT SURGERY Right 2012   RETINAL DETACHMENT SURGERY Left    x 4   TUBAL LIGATION  1993   Social History:  reports that she has never smoked. She has never used smokeless tobacco. She reports current alcohol use. She reports that she does not use drugs.  Allergies  Allergen Reactions   Codeine Nausea Only    Can take Percocet with Zofran    Ibuprofen Other (See Comments)    Irritates stomach due to hiatal hernia    Shellfish Allergy Nausea Only    Family History  Problem Relation Age of Onset   Stroke Mother 32       CVA; tobacco abuse.   Mental illness Mother    Alcohol abuse Mother    Diabetes Father    Cancer Father  brain cancer/malignancy   Mental illness Father    Diabetes Brother    Kidney failure Brother    Diabetes Sister    Depression Sister    Cancer Sister    Colon cancer Neg Hx     Prior to Admission medications   Medication Sig Start Date End Date Taking? Authorizing Provider  aspirin EC 81 MG tablet Take 81 mg by mouth daily. Swallow whole.   Yes [provider]  carbidopa -levodopa  (SINEMET  IR) 25-100 MG tablet TAKE 1 TABLET BY MOUTH 3 (THREE) TIMES DAILY. 8AM/NOON/4PM 08/29/23  Yes Tat, Asberry S, DO  FENUGREEK PO Take 1 capsule by mouth daily as needed (for congestion).   Yes [provider]  FLUoxetine  (PROZAC ) 20 MG tablet Take 20 mg by mouth daily. 01/06/23  Yes [provider]  Ginkgo Biloba 40 MG  TABS Take 40 mg by mouth daily.   Yes [provider]  Glucos-MSM-C-Mn-Ginger-Willow (MSM GLUCOSAMINE COMPLEX PO) Take 1 capsule by mouth daily.    Yes [provider]  levothyroxine  (SYNTHROID ) 88 MCG tablet Take 88 mcg by mouth daily before breakfast. 01/20/23  Yes [provider]  Multiple Vitamin (MULTIVITAMIN WITH MINERALS) TABS tablet Take 1 tablet by mouth daily.   Yes [provider]  olmesartan (BENICAR) 20 MG tablet Take 20 mg by mouth daily. 01/21/23  Yes [provider]  Omega-3 Fatty Acids (FISH OIL PO) Take 2 capsules by mouth daily.    Yes [provider]  pantoprazole  (PROTONIX ) 40 MG tablet TAKE 1 TABLET (40 MG TOTAL) BY MOUTH DAILY. 12/13/16  Yes Pyrtle, Gordy HERO, MD  albuterol  (PROVENTIL  HFA;VENTOLIN  HFA) 108 (90 Base) MCG/ACT inhaler Inhale 2 puffs into the lungs every 6 (six) hours as needed for wheezing or shortness of breath (cough, shortness of breath or wheezing.). Patient not taking: Reported on 02/04/2024 04/07/16   Arloa Suzen RAMAN, NP  feeding supplement (ENSURE ENLIVE / ENSURE PLUS) LIQD Take 237 mLs by mouth 3 (three) times daily between meals. 02/20/23   Josette Ade, MD    Physical Exam: Vitals:   02/04/24 1700 02/04/24 1701 02/04/24 1739 02/04/24 2005  BP: 132/75  124/67 (!) 142/66  Pulse: 74  73 74  Resp: 18  19 18   Temp: 98.9 F (37.2 C)   98.6 F (37 C)  TempSrc: Oral   Oral  SpO2: 98%  100% 99%  Weight:  113.2 kg    Height:  5' 5 (1.651 m)     Physical Examination: General appearance - alert, well appearing, and in no distress and chronically ill appearing Chest - clear to auscultation, no wheezes, rales or rhonchi, symmetric air entry Heart - normal rate, regular rhythm, normal S1, S2, no murmurs, rubs, clicks or gallops Abdomen - soft, nontender, nondistended, no masses or organomegaly Extremities - pedal edema trace +  Data Reviewed: Results for orders placed or performed during the hospital  encounter of 02/04/24 (from the past 24 hours)  Urinalysis, Routine w reflex microscopic -Urine, Clean Catch     Status: Abnormal   Collection Time: 02/04/24  5:03 PM  Result Value Ref Range   Color, Urine YELLOW (A) YELLOW   APPearance HAZY (A) CLEAR   Specific Gravity, Urine 1.009 1.005 - 1.030   pH 7.0 5.0 - 8.0   Glucose, UA NEGATIVE NEGATIVE mg/dL   Hgb urine dipstick Mabey (A) NEGATIVE   Bilirubin Urine NEGATIVE NEGATIVE   Ketones, ur NEGATIVE NEGATIVE mg/dL   Protein, ur NEGATIVE NEGATIVE mg/dL  Nitrite POSITIVE (A) NEGATIVE   Leukocytes,Ua MODERATE (A) NEGATIVE   RBC / HPF 6-10 0 - 5 RBC/hpf   WBC, UA 21-50 0 - 5 WBC/hpf   Bacteria, UA MANY (A) NONE SEEN   Squamous Epithelial / HPF 0-5 0 - 5 /HPF   Mucus PRESENT   Comprehensive metabolic panel     Status: Abnormal   Collection Time: 02/04/24  5:06 PM  Result Value Ref Range   Sodium 142 135 - 145 mmol/L   Potassium 3.9 3.5 - 5.1 mmol/L   Chloride 108 98 - 111 mmol/L   CO2 25 22 - 32 mmol/L   Glucose, Bld 105 (H) 70 - 99 mg/dL   BUN 21 8 - 23 mg/dL   Creatinine, Ser 9.13 0.44 - 1.00 mg/dL   Calcium  8.7 (L) 8.9 - 10.3 mg/dL   Total Protein 6.6 6.5 - 8.1 g/dL   Albumin 3.1 (L) 3.5 - 5.0 g/dL   AST 29 15 - 41 U/L   ALT 26 0 - 44 U/L   Alkaline Phosphatase 74 38 - 126 U/L   Total Bilirubin 1.0 0.0 - 1.2 mg/dL   GFR, Estimated >39 >39 mL/min   Anion gap 9 5 - 15  CBC     Status: Abnormal   Collection Time: 02/04/24  5:06 PM  Result Value Ref Range   WBC 8.5 4.0 - 10.5 K/uL   RBC 3.61 (L) 3.87 - 5.11 MIL/uL   Hemoglobin 12.3 12.0 - 15.0 g/dL   HCT 63.9 63.9 - 53.9 %   MCV 99.7 80.0 - 100.0 fL   MCH 34.1 (H) 26.0 - 34.0 pg   MCHC 34.2 30.0 - 36.0 g/dL   RDW 87.4 88.4 - 84.4 %   Platelets 181 150 - 400 K/uL   nRBC 0.0 0.0 - 0.2 %  Troponin I (High Sensitivity)     Status: Abnormal   Collection Time: 02/04/24  5:06 PM  Result Value Ref Range   Troponin I (High Sensitivity) 26 (H) <18 ng/L   CT ABDOMEN PELVIS W  CONTRAST Result Date: 02/04/2024 CLINICAL DATA:  Vomiting, bowel obstruction suspected. EXAM: CT ABDOMEN AND PELVIS WITH CONTRAST TECHNIQUE: Multidetector CT imaging of the abdomen and pelvis was performed using the standard protocol following bolus administration of intravenous contrast. RADIATION DOSE REDUCTION: This exam was performed according to the departmental dose-optimization program which includes automated exposure control, adjustment of the mA and/or kV according to patient size and/or use of iterative reconstruction technique. CONTRAST:  100mL OMNIPAQUE IOHEXOL 300 MG/ML  SOLN COMPARISON:  None Available. FINDINGS: Lower chest: No acute abnormality. Hepatobiliary: No focal liver abnormality is seen. Status post cholecystectomy. No biliary dilatation. Pancreas: There is a cystic lesion in the pancreatic neck measuring 1.6 cm. No pancreatic ductal dilatation or surrounding inflammatory changes. Spleen: Normal in size without focal abnormality. Adrenals/Urinary Tract: The adrenal glands are within normal limits. The kidneys enhance symmetrically. No renal calculus or hydronephrosis bilaterally. The bladder is unremarkable. Stomach/Bowel: There is a Matusik hiatal hernia. The stomach is otherwise within normal limits. No bowel obstruction, free air, or pneumatosis. Scattered diverticula are present along the colon without evidence of diverticulitis. There is a large amount of stool in the rectal vault. A fat containing paraumbilical hernia is noted containing a portion of the sigmoid colon. Appendix appears normal. Vascular/Lymphatic: No significant vascular findings are present. No enlarged abdominal or pelvic lymph nodes. Reproductive: Uterus and bilateral adnexa are unremarkable. Other: No abdominopelvic ascites. Musculoskeletal: Degenerative changes are  present in the thoracolumbar spine. No acute osseous abnormality is seen. IMPRESSION: 1. No evidence of bowel obstruction. 2. Moderate to large amount  amount of stool in the rectal vault, possible fecal impaction. 3. Diverticulosis without diverticulitis. 4. Bonnes hiatal hernia. 5. 1.6 cm cyst in the neck of the pancreas. One year for follow-up CT or MRI is recommended to document stability. Electronically Signed   By: Leita Birmingham M.D.   On: 02/04/2024 18:37   CT Head Wo Contrast Result Date: 02/04/2024 EXAM: CT HEAD WITHOUT CONTRAST 02/04/2024 06:14:32 PM TECHNIQUE: CT of the head was performed without the administration of intravenous contrast. Automated exposure control, iterative reconstruction, and/or weight based adjustment of the mA/kV was utilized to reduce the radiation dose to as low as reasonably achievable. COMPARISON: Comparison with 02/17/2023. CLINICAL HISTORY: Delirium. Pt BIB [facility] from home for AMS, more than baseline. Pt recently seen at [facility] was diagnosed with Lewybody Dementia. Pt is incontinent or urine. EMS reports pt feels tired, started last night, vomited last night as well. Vitals 130/70 70 NSR pac, 98% RA. FINDINGS: BRAIN AND VENTRICLES: Atrophy. No acute hemorrhage. No evidence of acute infarct. No hydrocephalus. No extra-axial collection. No mass effect or midline shift. Chronic microvascular ischemia and generalized atrophy. ORBITS: No acute abnormality. SINUSES: No acute abnormality. SOFT TISSUES AND SKULL: No acute soft tissue abnormality. No skull fracture. IMPRESSION: 1. No acute intracranial abnormality. Electronically signed by: Norman Gatlin MD 02/04/2024 06:36 PM EDT RP Workstation: [id number]     Assessment and Plan: * UTI (urinary tract infection) Check urine culture IV Rocephin  Acute metabolic encephalopathy Unclear etiology, likely related to UTI IV Rocephin Culture urine This has been recurrent on several occasions in patient who has possible Parkinson's versus Lewy body dementia.  According to the husband they are more leaning toward a diagnosis of Lewy body dementia Palliative care  consult PT  Chronic diastolic CHF (congestive heart failure) (HCC) Last EF at [facility] was 55% No signs of fluid overload at present  Thyroid cancer (HCC) With subsequent hypothyroidism Recent normal TSH Continue Synthroid   Hyperlipidemia, unspecified Continue omega-3's Low cholesterol diet  HTN (hypertension) Continue ARB  Tremor Patient has been on levodopa  in the past, after med reconciliation we will continue this if she continues on it at home.  Depression Continue Prozac   Gastroesophageal reflux disease without esophagitis Met down hairstyle continue PPI      Advance Care Planning:   Code Status: Limited: Do not attempt resuscitation (DNR) -DNR-LIMITED -Do Not Intubate/DNI confirmed with her husband  Consults: None  Family Communication: Husband at bedside  Severity of Illness: The appropriate patient status for this patient is OBSERVATION. Observation status is judged to be reasonable and necessary in order to provide the required intensity of service to ensure the patient's safety. The patient's presenting symptoms, physical exam findings, and initial radiographic and laboratory data in the context of their medical condition is felt to place them at decreased risk for further clinical deterioration. Furthermore, it is anticipated that the patient will be medically stable for discharge from the hospital within 2 midnights of admission.   Author: Glenys GORMAN Birk, MD 02/04/2024 8:19 PM  For on call review [email or web address].

## 2024-02-04 NOTE — ED Provider Notes (Signed)
 [facility] Provider Note    Event Date/Time   First MD Initiated Contact with Patient 02/04/24 1710     (approximate)   History   Altered Mental Status  EM caveat: Dementia, altered mental status  HPI  Jasmine Malone is a 78 y.o. female recently discharged from [facility] on September 23 with diagnosis of NSTEMI hypertension hyperlipidemia Parkinson's cognitive decline and altered mental status.  Discharge summary from [facility] denotes an MRI that did not show acute intracranial abnormality.  Close significant workup and is felt to have Parkinson's possible Lewy body dementia.  Also diagnosed with a possible NSTEMI with echo of greater than 55% and had no chest pain  Also had hyperkalemia which improved   Family at bedside reports that left [facility] hospital she was quite fatigued they had considered doing rehab but insurance did not qualify her.  She went home instead and was supposed to initiate home rehab but has not started yet.  This morning she got up to use the bathroom at 5 AM and she was so fatigued and felt so tired that she was not able to get herself up off the toilet.  Her husband had to assist her back into bed and she has been in bed throughout the day.  She has confusion intermittently, does not seem to become severely worse but she has been more fatigued more tired and slightly more confused than typical  She has not complained of any pain.  Patient reports nothing painful nothing hurting does not feel weak on 1 side no headaches no chest pain no abdominal pain.   Reports patient got up at some point during the night and vomited once nonbloody.  She has not complained of any significant pain or discomfort  Physical Exam   Triage Vital Signs: ED Triage Vitals  Encounter Vitals Group     BP 02/04/24 1700 132/75     Girls Systolic BP Percentile --      Girls Diastolic BP Percentile --      Boys Systolic BP Percentile --      Boys Diastolic BP Percentile  --      Pulse Rate 02/04/24 1700 74     Resp 02/04/24 1700 18     Temp 02/04/24 1700 98.9 F (37.2 C)     Temp Source 02/04/24 1700 Oral     SpO2 02/04/24 1700 98 %     Weight 02/04/24 1701 249 lb 9 oz (113.2 kg)     Height 02/04/24 1701 5' 5 (1.651 m)     Head Circumference --      Peak Flow --      Pain Score --      Pain Loc --      Pain Education --      Exclude from Growth Chart --     Most recent vital signs: Vitals:   02/04/24 1700 02/04/24 1739  BP: 132/75 124/67  Pulse: 74 73  Resp: 18 19  Temp: 98.9 F (37.2 C)   SpO2: 98% 100%     General: Awake, no distress.  She is recognizes her husband, reports feeling very fatigued and weak.  She appears generally fatigued but in no acute distress moves all extremities to command with no focal deficits.  Extraocular movements are conjugate.  No active emesis CV:  Good peripheral perfusion.  Normal tones and rate Resp:  Normal effort.  Clear bilateral with normal work of breathing Abd:  No distention.  Soft nontender nondistended throughout this time but evidently earlier today had vomiting and is unclear if she had abdominal pain at that time.  No active vomiting or abdominal pain noted now Other:  Patient able to provide urine sample   ED Results / Procedures / Treatments   Labs (all labs ordered are listed, but only abnormal results are displayed) Labs Reviewed  COMPREHENSIVE METABOLIC PANEL WITH GFR - Abnormal; Notable for the following components:      Result Value   Glucose, Bld 105 (*)    Calcium  8.7 (*)    Albumin 3.1 (*)    All other components within normal limits  CBC - Abnormal; Notable for the following components:   RBC 3.61 (*)    MCH 34.1 (*)    All other components within normal limits  URINALYSIS, ROUTINE W REFLEX MICROSCOPIC - Abnormal; Notable for the following components:   Color, Urine YELLOW (*)    APPearance HAZY (*)    Hgb urine dipstick Throne (*)    Nitrite POSITIVE (*)    Leukocytes,Ua  MODERATE (*)    Bacteria, UA MANY (*)    All other components within normal limits  TROPONIN I (HIGH SENSITIVITY) - Abnormal; Notable for the following components:   Troponin I (High Sensitivity) 26 (*)    All other components within normal limits  URINE CULTURE  CBG MONITORING, ED     EKG  Inter by me at 1755 heart rate 80 QRS 110 QTc 500 sinus rhythm no evidence of acute ischemia   RADIOLOGY  CT ABDOMEN PELVIS W CONTRAST Result Date: 02/04/2024 CLINICAL DATA:  Vomiting, bowel obstruction suspected. EXAM: CT ABDOMEN AND PELVIS WITH CONTRAST TECHNIQUE: Multidetector CT imaging of the abdomen and pelvis was performed using the standard protocol following bolus administration of intravenous contrast. RADIATION DOSE REDUCTION: This exam was performed according to the departmental dose-optimization program which includes automated exposure control, adjustment of the mA and/or kV according to patient size and/or use of iterative reconstruction technique. CONTRAST:  100mL OMNIPAQUE IOHEXOL 300 MG/ML  SOLN COMPARISON:  None Available. FINDINGS: Lower chest: No acute abnormality. Hepatobiliary: No focal liver abnormality is seen. Status post cholecystectomy. No biliary dilatation. Pancreas: There is a cystic lesion in the pancreatic neck measuring 1.6 cm. No pancreatic ductal dilatation or surrounding inflammatory changes. Spleen: Normal in size without focal abnormality. Adrenals/Urinary Tract: The adrenal glands are within normal limits. The kidneys enhance symmetrically. No renal calculus or hydronephrosis bilaterally. The bladder is unremarkable. Stomach/Bowel: There is a Rathod hiatal hernia. The stomach is otherwise within normal limits. No bowel obstruction, free air, or pneumatosis. Scattered diverticula are present along the colon without evidence of diverticulitis. There is a large amount of stool in the rectal vault. A fat containing paraumbilical hernia is noted containing a portion of the  sigmoid colon. Appendix appears normal. Vascular/Lymphatic: No significant vascular findings are present. No enlarged abdominal or pelvic lymph nodes. Reproductive: Uterus and bilateral adnexa are unremarkable. Other: No abdominopelvic ascites. Musculoskeletal: Degenerative changes are present in the thoracolumbar spine. No acute osseous abnormality is seen. IMPRESSION: 1. No evidence of bowel obstruction. 2. Moderate to large amount amount of stool in the rectal vault, possible fecal impaction. 3. Diverticulosis without diverticulitis. 4. Mcneece hiatal hernia. 5. 1.6 cm cyst in the neck of the pancreas. One year for follow-up CT or MRI is recommended to document stability. Electronically Signed   By: Leita Birmingham M.D.   On: 02/04/2024 18:37   CT Head Wo Contrast  Result Date: 02/04/2024 EXAM: CT HEAD WITHOUT CONTRAST 02/04/2024 06:14:32 PM TECHNIQUE: CT of the head was performed without the administration of intravenous contrast. Automated exposure control, iterative reconstruction, and/or weight based adjustment of the mA/kV was utilized to reduce the radiation dose to as low as reasonably achievable. COMPARISON: Comparison with 02/17/2023. CLINICAL HISTORY: Delirium. Pt BIB [facility] from home for AMS, more than baseline. Pt recently seen at [facility] was diagnosed with Lewybody Dementia. Pt is incontinent or urine. EMS reports pt feels tired, started last night, vomited last night as well. Vitals 130/70 70 NSR pac, 98% RA. FINDINGS: BRAIN AND VENTRICLES: Atrophy. No acute hemorrhage. No evidence of acute infarct. No hydrocephalus. No extra-axial collection. No mass effect or midline shift. Chronic microvascular ischemia and generalized atrophy. ORBITS: No acute abnormality. SINUSES: No acute abnormality. SOFT TISSUES AND SKULL: No acute soft tissue abnormality. No skull fracture. IMPRESSION: 1. No acute intracranial abnormality. Electronically signed by: Norman Gatlin MD 02/04/2024 06:36 PM EDT RP Workstation:  [id number]   No dyspnea no associated chest symptoms no cough.   PROCEDURES:  Critical Care performed: No  Procedures   MEDICATIONS ORDERED IN ED: Medications  cefTRIAXone (ROCEPHIN) 1 g in sodium chloride  0.9 % 100 mL IVPB (1 g Intravenous New Bag/Given 02/04/24 1932)  sodium chloride  0.9 % bolus 500 mL (0 mLs Intravenous Stopped 02/04/24 1931)  ondansetron  (ZOFRAN ) injection 4 mg (4 mg Intravenous Given 02/04/24 1743)  iohexol (OMNIPAQUE) 300 MG/ML solution 100 mL (100 mLs Intravenous Contrast Given 02/04/24 1802)     IMPRESSION / MDM / ASSESSMENT AND PLAN / ED COURSE  I reviewed the triage vital signs and the nursing notes.                              Differential diagnosis includes, but is not limited to, possible underlying neurologic condition such as dementia, Lewy body, generalized weakness dehydration fatigue also had episode of vomiting though could be related to medication effect, vasovagal, infection etc.  No fever no white count.  I will reevaluate multiple aspects as to cause.  After further evaluation she has findings that seem to suggest a high potential for urinary tract infection.  Had Citrobacter on culture at [facility] but at that time did not necessarily grow enough to reasonably diagnose UTI based on what I see is her previous urinalysis.  Today seems more diagnostic.  Minimally elevated troponin suspect demand no associated chest pain reassuring EKG.  No pulmonary symptoms.  Discussed with patient and family, agreeable plan for admission for treatment of presumed UTI culture.  I will start Rocephin having discussed antibiotic coverage is with pharmacist will   Patient's presentation is most consistent with acute complicated illness / injury requiring diagnostic workup.     Accepted to hospitalist by Dr. Fredirick Clinical Course as of 02/04/24 1938  Sat Feb 04, 2024  1938 Accepted to hospitalist service by Dr. Fredirick [MQ]    Clinical Course User Index [MQ] Dicky Anes, MD     FINAL CLINICAL IMPRESSION(S) / ED DIAGNOSES   Final diagnoses:  Somnolence  Acute cystitis without hematuria  Pancreatic cyst     Rx / DC Orders   ED Discharge Orders     None        Note:  This document was prepared using Dragon voice recognition software and may include unintentional dictation errors.   Dicky Anes, MD 02/04/24 [phone]

## 2024-02-04 NOTE — Assessment & Plan Note (Signed)
 Patient has been on levodopa  in the past, after med reconciliation we will continue this if she continues on it at home.

## 2024-02-04 NOTE — Assessment & Plan Note (Signed)
 Continue omega-3's Low cholesterol diet

## 2024-02-04 NOTE — Assessment & Plan Note (Signed)
--  Continue ARB 

## 2024-02-04 NOTE — Assessment & Plan Note (Signed)
 With subsequent hypothyroidism Recent normal TSH Continue Synthroid 

## 2024-02-04 NOTE — ED Notes (Signed)
 Pericare provided after this RN assisted PT onto bed pan. New brief applied after having a streak of stool. Pt repositioned. Warm blanket given. NAD.

## 2024-02-04 NOTE — Assessment & Plan Note (Signed)
 Continue Prozac

## 2024-02-04 NOTE — Assessment & Plan Note (Signed)
 Last EF at [facility] was 55% No signs of fluid overload at present

## 2024-02-04 NOTE — Assessment & Plan Note (Signed)
 Unclear etiology, likely related to UTI IV Rocephin Culture urine This has been recurrent on several occasions in patient who has possible Parkinson's versus Lewy body dementia.  According to the husband they are more leaning toward a diagnosis of Lewy body dementia Palliative care consult PT

## 2024-02-04 NOTE — ED Triage Notes (Signed)
 Pt BIB [facility] from home for AMS, more than baseline. Pt recently seen at [facility] was diagnosed with Lewybody Dementia. Pt is incontinent or urine. EMS reports pt feels tired, started last night, vomited last night as well.   vitals 130/70 70 NSR pac, 98% RA

## 2024-02-04 NOTE — Assessment & Plan Note (Signed)
 Check urine culture IV Rocephin

## 2024-02-05 DIAGNOSIS — N3000 Acute cystitis without hematuria: Secondary | ICD-10-CM | POA: Diagnosis not present

## 2024-02-05 DIAGNOSIS — Z515 Encounter for palliative care: Secondary | ICD-10-CM | POA: Diagnosis not present

## 2024-02-05 DIAGNOSIS — K862 Cyst of pancreas: Secondary | ICD-10-CM

## 2024-02-05 DIAGNOSIS — Z7189 Other specified counseling: Secondary | ICD-10-CM | POA: Diagnosis not present

## 2024-02-05 DIAGNOSIS — Z66 Do not resuscitate: Secondary | ICD-10-CM | POA: Diagnosis not present

## 2024-02-05 DIAGNOSIS — R400 Somnolence: Secondary | ICD-10-CM

## 2024-02-05 DIAGNOSIS — G9341 Metabolic encephalopathy: Secondary | ICD-10-CM

## 2024-02-05 DIAGNOSIS — I5032 Chronic diastolic (congestive) heart failure: Secondary | ICD-10-CM

## 2024-02-05 LAB — COMPREHENSIVE METABOLIC PANEL WITH GFR
ALT: 7 U/L (ref 0–44)
AST: 26 U/L (ref 15–41)
Albumin: 2.9 g/dL — ABNORMAL LOW (ref 3.5–5.0)
Alkaline Phosphatase: 69 U/L (ref 38–126)
Anion gap: 11 (ref 5–15)
BUN: 22 mg/dL (ref 8–23)
CO2: 24 mmol/L (ref 22–32)
Calcium: 8.5 mg/dL — ABNORMAL LOW (ref 8.9–10.3)
Chloride: 109 mmol/L (ref 98–111)
Creatinine, Ser: 0.9 mg/dL (ref 0.44–1.00)
GFR, Estimated: >60 mL/min (ref >60)
Glucose, Bld: 87 mg/dL (ref 70–99)
Potassium: 3.5 mmol/L (ref 3.5–5.1)
Sodium: 144 mmol/L (ref 135–145)
Total Bilirubin: 0.8 mg/dL (ref 0.0–1.2)
Total Protein: 5.9 g/dL — ABNORMAL LOW (ref 6.5–8.1)

## 2024-02-05 LAB — PHOSPHORUS: Phosphorus: 2.8 mg/dL (ref 2.5–4.6)

## 2024-02-05 LAB — TROPONIN I (HIGH SENSITIVITY)
Troponin I (High Sensitivity): 29 ng/L — ABNORMAL HIGH (ref <18)
Troponin I (High Sensitivity): 33 ng/L — ABNORMAL HIGH (ref <18)

## 2024-02-05 LAB — MAGNESIUM: Magnesium: 2.1 mg/dL (ref 1.7–2.4)

## 2024-02-05 MED ORDER — ENOXAPARIN SODIUM 60 MG/0.6ML IJ SOSY
0.5000 mg/kg | PREFILLED_SYRINGE | INTRAMUSCULAR | Status: DC
  Administered 2024-02-05: 50 mg via SUBCUTANEOUS
  Filled 2024-02-05: qty 0.6

## 2024-02-05 MED ORDER — BISACODYL 5 MG PO TBEC
10.0000 mg | DELAYED_RELEASE_TABLET | Freq: Every day | ORAL | Status: DC
  Administered 2024-02-05: 10 mg via ORAL
  Filled 2024-02-05: qty 2

## 2024-02-05 MED ORDER — POLYETHYLENE GLYCOL 3350 17 G PO PACK
17.0000 g | PACK | Freq: Two times a day (BID) | ORAL | Status: DC
  Administered 2024-02-05 – 2024-02-06 (×3): 17 g via ORAL
  Filled 2024-02-05 (×3): qty 1

## 2024-02-05 MED ORDER — BISACODYL 5 MG PO TBEC
10.0000 mg | DELAYED_RELEASE_TABLET | Freq: Once | ORAL | Status: AC
Start: 2024-02-05 — End: 2024-02-05
  Administered 2024-02-05: 10 mg via ORAL
  Filled 2024-02-05: qty 2

## 2024-02-05 MED ORDER — BISACODYL 10 MG RE SUPP: 10.0000 mg | Freq: Every day | RECTAL | Status: DC | PRN

## 2024-02-05 NOTE — Evaluation (Signed)
 Occupational Therapy Evaluation Patient Details Name: Jasmine Malone MRN: [id number] DOB: 10-04-1945 Today's Date: 02/05/2024   History of Present Illness   Jasmine Malone is a 78 y.o. female whose presentation is complicated by hypothyroidism secondary to thyroid cancer status post thyroidectomy, hypertension, hyperlipidemia, history of NSTEMI and CAD that presented with AMS and difficulty with producing speech.     Clinical Impressions Pt in bed upon OT arrival and very agreeable for OOB activities.  Hospital gown soiled with urine.  Pt able to amb with RW and min guard-min A d/t cognition/tactile cues for directional changes from bed to bathroom for toileting.  Able to have BM, requiring total A for peri care, and max A for gown change.  Hx obtained by family who was present end of session.  Pt poor historian and follows 1 step commands intermittently d/t hx of Lewy Body Dementia.  See additional ADL details below.  Family goal is for pt to d/c with Home Health.  OT in agreement with this plan.  Will continue to follow in the acute setting to maximize safety, activity tolerance, functional mobility, and participation with ADLs in order to reduce risk of return hospitalization and reduce physical burden of care on family.  Family acknowledges AE needs met at home.     If plan is discharge home, recommend the following:   A little help with walking and/or transfers;Supervision due to cognitive status;A lot of help with bathing/dressing/bathroom;Assist for transportation;Help with stairs or ramp for entrance     Functional Status Assessment   Patient has had a recent decline in their functional status and demonstrates the ability to make significant improvements in function in a reasonable and predictable amount of time.     Equipment Recommendations   None recommended by OT     Recommendations for Other Services         Precautions/Restrictions   Precautions Precautions:  Fall Recall of Precautions/Restrictions: Impaired Restrictions Weight Bearing Restrictions Per Provider Order: No     Mobility Bed Mobility Overal bed mobility: Needs Assistance Bed Mobility: Supine to Sit     Supine to sit: Contact guard, Used rails, HOB elevated     General bed mobility comments: increased time Patient Response: Cooperative  Transfers Overall transfer level: Needs assistance Equipment used: Rolling walker (2 wheels) Transfers: Sit to/from Stand, Bed to chair/wheelchair/BSC Sit to Stand: Min assist           General transfer comment: CGA for STS from EOB, min A from toilet, min A for controlled descent to bedside chair      Balance Overall balance assessment: Needs assistance Sitting-balance support: No upper extremity supported, Feet supported Sitting balance-Leahy Scale: Fair Sitting balance - Comments: Able to sit EOB without UE support, but does not attempt to reach outside BOS   Standing balance support: During functional activity, Reliant on assistive device for balance Standing balance-Leahy Scale: Poor Standing balance comment: Limited reach outside BOS in standing                           ADL either performed or assessed with clinical judgement   ADL Overall ADL's : Needs assistance/impaired Eating/Feeding: Set up   Grooming: Wash/dry hands;Contact guard assist;Standing Grooming Details (indicate cue type and reason): sequencing cues     Lower Body Bathing: Total assistance   Upper Body Dressing : Maximal assistance Upper Body Dressing Details (indicate cue type and reason): changing soiled hospital gown  Lower Body Dressing: Total assistance Lower Body Dressing Details (indicate cue type and reason): Donning socks Toilet Transfer: Minimal assistance;Grab bars   Toileting- Clothing Manipulation and Hygiene: Total assistance       Functional mobility during ADLs: Minimal assistance;Cueing for sequencing;Rolling walker  (2 wheels);Cueing for safety General ADL Comments: Ambulatory within room to reach bathroom for toileting     Vision         Perception         Praxis         Pertinent Vitals/Pain Pain Assessment Pain Assessment: Faces Pain Score: 0-No pain     Extremity/Trunk Assessment Upper Extremity Assessment Upper Extremity Assessment: Generalized weakness   Lower Extremity Assessment Lower Extremity Assessment: Generalized weakness       Communication Communication Communication: Impaired Factors Affecting Communication: Difficulty expressing self   Cognition Arousal: Alert Behavior During Therapy: WFL for tasks assessed/performed Cognition: History of cognitive impairments                               Following commands: Impaired Following commands impaired: Follows one step commands inconsistently     Cueing  General Comments   Cueing Techniques: Verbal cues;Tactile cues      Exercises Other Exercises Other Exercises: Educ on OT role, goals, poc (family present end of session)   Shoulder Instructions      Home Living Family/patient expects to be discharged to:: Private residence Living Arrangements: Spouse/significant other;Children Available Help at Discharge: Family Type of Home: House                       Home Equipment: Agricultural consultant (2 wheels);Grab bars - tub/shower;Shower seat;Other (comment);Rollator (4 wheels);Grab bars - toilet (toilet safety rails)          Prior Functioning/Environment Prior Level of Function : Needs assist  Cognitive Assist : Mobility (cognitive);ADLs (cognitive) Mobility (Cognitive): Intermittent cues ADLs (Cognitive): Step by step cues Physical Assist : Mobility (physical);ADLs (physical) Mobility (physical): Bed mobility;Transfers;Gait ADLs (physical): Grooming;Bathing;Dressing;Toileting Mobility Comments: Spouse reports pt predominantly walked with FWW in the home, but does have rollator; no  wc, and ambulatory for short distances in community to enter a medical office ADLs Comments: Spouse and adult children assisted with all basic ADLs d/t hx of Lewey Body dementia.    OT Problem List: Decreased strength;Decreased knowledge of use of DME or AE;Decreased knowledge of precautions;Decreased cognition;Impaired balance (sitting and/or standing);Decreased safety awareness   OT Treatment/Interventions: Self-care/ADL training;Therapeutic exercise;Patient/family education;Balance training;Therapeutic activities;DME and/or AE instruction      OT Goals(Current goals can be found in the care plan section)   Acute Rehab OT Goals Patient Stated Goal: Home with home health PT/OT per family request OT Goal Formulation: With patient/family Time For Goal Achievement: 02/19/24 Potential to Achieve Goals: Good ADL Goals Pt Will Perform Grooming: with mod assist;standing Pt Will Perform Upper Body Bathing: with min assist;sitting Pt Will Transfer to Toilet: with min assist;ambulating;grab bars   OT Frequency:  Min 2X/week    Co-evaluation              AM-PAC OT 6 Clicks Daily Activity     Outcome Measure Help from another person eating meals?: A Little Help from another person taking care of personal grooming?: A Lot Help from another person toileting, which includes using toliet, bedpan, or urinal?: A Lot Help from another person bathing (including washing, rinsing, drying)?: A Lot Help  from another person to put on and taking off regular upper body clothing?: A Lot Help from another person to put on and taking off regular lower body clothing?: Total 6 Click Score: 12   End of Session Equipment Utilized During Treatment: Gait belt;Rolling walker (2 wheels) Nurse Communication: Mobility status;Other (comment) (pt able to have BM in commode)  Activity Tolerance: Patient tolerated treatment well Patient left: in chair;with call bell/phone within reach;with chair alarm set;with  family/visitor present  OT Visit Diagnosis: Muscle weakness (generalized) (M62.81);Unsteadiness on feet (R26.81);Other symptoms and signs involving cognitive function                Time: 9090-9054 OT Time Calculation (min): 36 min Charges:  OT General Charges $OT Visit: 1 Visit OT Evaluation $OT Eval Moderate Complexity: 1 Mod OT Treatments $Self Care/Home Management : 8-22 mins  Inocente Blazing, MS, OTR/L   Inocente MARLA Blazing 02/05/2024, 10:11 AM

## 2024-02-05 NOTE — Plan of Care (Signed)

## 2024-02-05 NOTE — Consult Note (Signed)
 Consultation Note Date: 02/05/2024 at 1130  Patient Name: Jasmine Malone  DOB: [birth year]  MRN: [id number]  Age / Sex: 78 y.o., female  PCP: Claudene Rayfield HERO, MD Referring Physician: Von Bellis, MD  HPI/Patient Profile: 78 y.o. female  with past medical history significant for Lewy body dementia, GERD, depression, HTN, HLD, HFpEF (>55%), thyroid cancer and obesity. Patient presented from home to ED 02/04/2024 c/o generalized weakness with extreme fatigue, 1 episode of non bloody emesis and confusion.   Of note, patient was recently admitted to [name] 01/31/24 with NSTEMI and hyperkalemia. Workup denotes MRI study that showed no acute intracranial abnormality. Further workup during that admission was concerning for Parkinson's and possible Lewy body dementia. Family reports that patient was considerably weak at discharge but insurance did not qualify her for rehab and went home instead.   ED labs significant for calcium  8.7, albumin 3.1, RBC 3.61, MCH 34.1. UA with Munroe hgb, positive nitrite, moderate leukocytes and many bacteria. Troponin 26.  CT AP  IMPRESSION: 1. No evidence of bowel obstruction. 2. Moderate to large amount amount of stool in the rectal vault, possible fecal impaction. 3. Diverticulosis without diverticulitis. 4. Quraishi hiatal hernia. 5. 1.6 cm cyst in the neck of the pancreas. One year for follow-up CT or MRI is recommended to document stability.  CT Head showed no acute intracranial abnormality.   TRH was consulted for admission and management of acute metabolic encephalopathy and UTI.   Palliative care was consulted to assist with goals of care conversations.   Clinical Assessment and Goals of Care: Extensive chart review completed prior to meeting patient including labs, vital signs, imaging, progress notes, orders, and available advanced directive documents from current and previous  encounters. I then met with patient, husband and daughter to discuss diagnosis prognosis, GOC, EOL wishes, disposition and options.    Latest Ref Rng & Units 02/04/2024    5:06 PM 02/19/2023    5:22 AM 02/18/2023    4:16 AM  CBC  WBC 4.0 - 10.5 K/uL 8.5  8.6  8.3   Hemoglobin 12.0 - 15.0 g/dL 87.6  85.6  85.7   Hematocrit 36.0 - 46.0 % 36.0  41.7  40.8   Platelets 150 - 400 K/uL 181  168  168       Latest Ref Rng & Units 02/05/2024    4:29 AM 02/04/2024    5:06 PM 02/19/2023    5:22 AM  CMP  Glucose 70 - 99 mg/dL 87  894  95   BUN 8 - 23 mg/dL 22  21  18    Creatinine 0.44 - 1.00 mg/dL 9.09  9.13  9.35   Sodium 135 - 145 mmol/L 144  142  139   Potassium 3.5 - 5.1 mmol/L 3.5  3.9  3.4   Chloride 98 - 111 mmol/L 109  108  103   CO2 22 - 32 mmol/L 24  25  27    Calcium  8.9 - 10.3 mg/dL 8.5  8.7  8.8   Total  Protein 6.5 - 8.1 g/dL 5.9  6.6    Total Bilirubin 0.0 - 1.2 mg/dL 0.8  1.0    Alkaline Phos 38 - 126 U/L 69  74    AST 15 - 41 U/L 26  29    ALT 0 - 44 U/L 7  26       Ill-appearing, elderly female sitting upright in recliner. She is alert and oriented to self, time, location and situation. She correctly names family members at bedside. She is able to participate in meaningful conversation. Respirations are even and unlabored. She is in no distress.   Feels better today. Complains of HA but shares RN has just given her Tylenol . She denies CP/SOB. She ate a Comacho amount of breakfast but drank all of her Ensure shake. Endorses no appetite at this time.   I introduced Palliative Medicine as specialized medical care for people living with serious illness. It focuses on providing relief from the symptoms and stress of a serious illness. The goal is to improve quality of life for both the patient and the family.  We discussed a brief life review of the patient. [name], husband, shares they he and Jasmine Malone have been married 53 years. They have 4 children and 8 grandchildren. 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   As far as functional and nutritional status, Jasmine Malone shares that his wife is mostly dependent in her ADLs, needing standby assist with showering, assistance dressing and dependent for meals and medications. She uses a walker and a cane to assist with ambulation. [name] and daughter have noticed a functional decline over the past 3 months. He reports good appetite but noticed a slight decrease since recent hospitalizations. Patient lives in her home with husband and daughter.   We discussed patient's current illness and what it means in the larger context of patient's on-going co-morbidities.  Natural disease trajectory and expectations at EOL were discussed. 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   I attempted to elicit values and goals of care important to the patient.  The patient states she wants to go home but also wants to have her house worked on. Husband shares that their home is 6 years old and does need some renovation.   The difference between aggressive medical intervention and comfort care was considered in light of the patient's goals of care.   Advance directives, concepts specific to code status, artificial feeding and hydration, and rehospitalization were considered and discussed. Both husband and daughter confirm patient is a DNR/DNI.   Education offered regarding concept specific to human mortality and the limitations of medical interventions to prolong life when the body begins to fail to thrive. Family understands that [name]'s disease is progressive and she may need higher level of care. [name] wants more information for palliative care in the home.  TOC consult placed for [facility] OP palliative to follow patient at discharge.   Family is facing treatment option decisions, advanced directive, and anticipatory care needs. Jasmine Malone shares that his daughter, [name], is [name]'s HPOA and  serves as Runner, broadcasting/film/video if her mother were to become confused and unable to make decisions for herself.    Discussed with patient/family the importance of continued conversation with family and the medical providers regarding overall plan of care and treatment options, ensuring decisions are within the context of the patient's values and GOCs.    Hospice and Palliative Care services outpatient  were explained and offered.  Questions and concerns were addressed. The family was encouraged to call with questions or concerns.   Primary Decision Maker HCPOA- [name], daughter  Physical Exam Vitals reviewed.  Constitutional:      General: She is not in acute distress.    Appearance: She is ill-appearing.  HENT:     Head: Normocephalic and atraumatic.     Mouth/Throat:     Mouth: Mucous membranes are moist.  Pulmonary:     Effort: Pulmonary effort is normal. No respiratory distress.  Abdominal:     General: There is no distension.     Tenderness: There is no abdominal tenderness. There is no guarding.  Musculoskeletal:     Right lower leg: Edema present.     Left lower leg: Edema present.  Skin:    General: Skin is warm and dry.  Neurological:     Mental Status: She is alert and oriented to person, place, and time.  Psychiatric:        Mood and Affect: Mood normal.        Behavior: Behavior normal.        Thought Content: Thought content normal.        Judgment: Judgment normal.     Recommendations/Plan: DNR/DNI Continue current supportive interventions Plan to d/c home with palliative care   Palliative Assessment/Data: 40-50%   Discussed plan of care with Dr. Von, [facility] and [facility] liaison.   Thank you for this consult. Palliative medicine will continue to follow and assist holistically.   Time Total: 90 minutes  Time spent includes: Detailed review of medical records (labs, imaging, vital signs), medically appropriate exam (mental status, respiratory, cardiac,  skin), discussed with treatment team, counseling and educating patient, family and staff, documenting clinical information, medication management and coordination of care.     Devere Sacks, AMANDA [facility] Palliative Medicine Team  02/05/2024 8:51 AM  Office [phone]  Pager [phone]     Please contact Palliative Medicine Team providers via AMION for questions and concerns.

## 2024-02-05 NOTE — Progress Notes (Signed)
 [facility] Progress Note  Patient: Jasmine Malone    FMW:[id number]  DOA: 02/04/2024     Date of Service: the patient was seen and examined on 02/05/2024  Chief Complaint  Patient presents with   Altered Mental Status   Brief hospital course:  Jasmine Malone is a 78 y.o. female with medical history significant of Lewy body dementia, GERD, depression, HTN, HLD, history of thyroid cancer, history of CHFpEF, obesity who was recently admitted to [facility] with altered mental status and had a fairly lengthy stay and workup there.  Patient was noted to have some elevated troponins and had consultation with neurology who felt like she had an acute metabolic cause versus worsening dementia since she seemed to regain her mental status at that time.  She was doing well for the last 5 days at home until last evening when she had an episode of emesis and again seem to have lost her memory.  Today she had generalized weakness and the inability to ambulate was brought to the ED.  In the ED she had a negative CT of the head and abdomen labs were essentially normal except for urinalysis which was indicative of probable UTI with nitrates moderate leuks and many bacteria.  Her last urine culture grew Citrobacter.    Assessment and Plan:   # Acute metabolic encephalopathy Unclear etiology, likely related to UTI Continue supportive care and treat UTI This has been recurrent on several occasions in patient who has possible Parkinson's versus Lewy body dementia.  According to the husband they are more leaning toward a diagnosis of Lewy body dementia Palliative care consult PT   # UTI, UA positive Continue IV Rocephin Follow culture urine   # Chronic diastolic CHF (congestive heart failure) (HCC) Last EF at [facility] was 55% No signs of fluid overload at present   # Thyroid cancer (HCC) With subsequent hypothyroidism Recent normal TSH Continue Synthroid    # Hyperlipidemia, unspecified:  Continue  omega-3's Low cholesterol diet   # HTN (hypertension): Continue ARB   # Tremor: Continued Sinemet  home dose  # Depression: Continue Prozac    # Gastroesophageal reflux disease without esophagitis Met down hairstyle continue PPI   # Constipation: Resolved Started laxatives  # Pancreatic cyst, incidental finding 1.6 cm cyst in the neck of the pancreas. One year for follow-up CT or MRI is recommended to document stability.   Body mass index is 36.58 kg/m.  Interventions:  Diet: Heart healthy diet DVT Prophylaxis: Subcutaneous Lovenox    Advance goals of care discussion: DNR-limited  Family Communication: family was present at bedside, at the time of interview.  The pt provided permission to discuss medical plan with the family. Opportunity was given to ask question and all questions were answered satisfactorily.   Disposition:  Pt is from home, admitted with acute metabolic encephalopathy and UTI, still on IV antibiotics and urine cultures pending, which precludes a safe discharge. Discharge to home, when stable, most likely tomorrow a.m.  Subjective: No significant events overnight, altered mental status is improving, patient denies any chest pain or depression or shortness of breath.  Patient did not move bowels today.  Denies any nausea vomiting, no abdominal pain.  Physical Exam: General: NAD, lying comfortably Appear in no distress, affect appropriate Eyes: PERRLA ENT: Oral Mucosa Clear, moist  Neck: no JVD,  Cardiovascular: S1 and S2 Present, no Murmur,  Respiratory: good respiratory effort, Bilateral Air entry equal and Decreased, no Crackles, no wheezes Abdomen: Bowel Sound present, Soft and  no tenderness,  Skin: no rashes Extremities: no Pedal edema, no calf tenderness Neurologic: without any new focal findings Gait not checked due to patient safety concerns  Vitals:   02/04/24 2005 02/04/24 2108 02/05/24 0354 02/05/24 0802  BP: (!) 142/66 124/64 119/68 (!)  126/52  Pulse: 74 69 65 60  Resp: 18 14 16 15   Temp: 98.6 F (37 C) 98.7 F (37.1 C) 98.1 F (36.7 C) 98.3 F (36.8 C)  TempSrc: Oral   Oral  SpO2: 99% 97% 99% 100%  Weight:  99.7 kg    Height:  5' 5 (1.651 m)      Intake/Output Summary (Last 24 hours) at 02/05/2024 0835 Last data filed at 02/04/2024 2215 Gross per 24 hour  Intake 920 ml  Output --  Net 920 ml   Filed Weights   02/04/24 1701 02/04/24 2108  Weight: 113.2 kg 99.7 kg    Data Reviewed: I have personally reviewed and interpreted daily labs, tele strips, imagings as discussed above. I reviewed all nursing notes, pharmacy notes, vitals, pertinent old records I have discussed plan of care as described above with RN and patient/family.  CBC: Recent Labs  Lab 02/04/24 1706  WBC 8.5  HGB 12.3  HCT 36.0  MCV 99.7  PLT 181   Basic Metabolic Panel: Recent Labs  Lab 02/04/24 1706 02/05/24 0429  NA 142 144  K 3.9 3.5  CL 108 109  CO2 25 24  GLUCOSE 105* 87  BUN 21 22  CREATININE 0.86 0.90  CALCIUM  8.7* 8.5*    Studies: CT ABDOMEN PELVIS W CONTRAST Result Date: 02/04/2024 CLINICAL DATA:  Vomiting, bowel obstruction suspected. EXAM: CT ABDOMEN AND PELVIS WITH CONTRAST TECHNIQUE: Multidetector CT imaging of the abdomen and pelvis was performed using the standard protocol following bolus administration of intravenous contrast. RADIATION DOSE REDUCTION: This exam was performed according to the departmental dose-optimization program which includes automated exposure control, adjustment of the mA and/or kV according to patient size and/or use of iterative reconstruction technique. CONTRAST:  100mL OMNIPAQUE IOHEXOL 300 MG/ML  SOLN COMPARISON:  None Available. FINDINGS: Lower chest: No acute abnormality. Hepatobiliary: No focal liver abnormality is seen. Status post cholecystectomy. No biliary dilatation. Pancreas: There is a cystic lesion in the pancreatic neck measuring 1.6 cm. No pancreatic ductal dilatation or  surrounding inflammatory changes. Spleen: Normal in size without focal abnormality. Adrenals/Urinary Tract: The adrenal glands are within normal limits. The kidneys enhance symmetrically. No renal calculus or hydronephrosis bilaterally. The bladder is unremarkable. Stomach/Bowel: There is a Coia hiatal hernia. The stomach is otherwise within normal limits. No bowel obstruction, free air, or pneumatosis. Scattered diverticula are present along the colon without evidence of diverticulitis. There is a large amount of stool in the rectal vault. A fat containing paraumbilical hernia is noted containing a portion of the sigmoid colon. Appendix appears normal. Vascular/Lymphatic: No significant vascular findings are present. No enlarged abdominal or pelvic lymph nodes. Reproductive: Uterus and bilateral adnexa are unremarkable. Other: No abdominopelvic ascites. Musculoskeletal: Degenerative changes are present in the thoracolumbar spine. No acute osseous abnormality is seen. IMPRESSION: 1. No evidence of bowel obstruction. 2. Moderate to large amount amount of stool in the rectal vault, possible fecal impaction. 3. Diverticulosis without diverticulitis. 4. Streed hiatal hernia. 5. 1.6 cm cyst in the neck of the pancreas. One year for follow-up CT or MRI is recommended to document stability. Electronically Signed   By: Leita Birmingham M.D.   On: 02/04/2024 18:37   CT Head  Wo Contrast Result Date: 02/04/2024 EXAM: CT HEAD WITHOUT CONTRAST 02/04/2024 06:14:32 PM TECHNIQUE: CT of the head was performed without the administration of intravenous contrast. Automated exposure control, iterative reconstruction, and/or weight based adjustment of the mA/kV was utilized to reduce the radiation dose to as low as reasonably achievable. COMPARISON: Comparison with 02/17/2023. CLINICAL HISTORY: Delirium. Pt BIB [facility] from home for AMS, more than baseline. Pt recently seen at [facility] was diagnosed with Lewybody Dementia. Pt is incontinent or  urine. EMS reports pt feels tired, started last night, vomited last night as well. Vitals 130/70 70 NSR pac, 98% RA. FINDINGS: BRAIN AND VENTRICLES: Atrophy. No acute hemorrhage. No evidence of acute infarct. No hydrocephalus. No extra-axial collection. No mass effect or midline shift. Chronic microvascular ischemia and generalized atrophy. ORBITS: No acute abnormality. SINUSES: No acute abnormality. SOFT TISSUES AND SKULL: No acute soft tissue abnormality. No skull fracture. IMPRESSION: 1. No acute intracranial abnormality. Electronically signed by: Norman Gatlin MD 02/04/2024 06:36 PM EDT RP Workstation: [id number]    Scheduled Meds:  aspirin EC  81 mg Oral Daily   carbidopa -levodopa   1 tablet Oral TID   enoxaparin  (LOVENOX ) injection  0.5 mg/kg Subcutaneous Q24H   feeding supplement  237 mL Oral TID BM   FLUoxetine   20 mg Oral Daily   irbesartan   150 mg Oral Daily   levothyroxine   88 mcg Oral QAC breakfast   multivitamin with minerals  1 tablet Oral Daily   omega-3 acid ethyl esters  2 g Oral Daily   pantoprazole   40 mg Oral Daily   sodium chloride  flush  3 mL Intravenous Q12H   Continuous Infusions:  sodium chloride      cefTRIAXone (ROCEPHIN)  IV     PRN Meds: sodium chloride , acetaminophen  **OR** acetaminophen , albuterol , metoprolol tartrate, polyethylene glycol, sodium chloride  flush  Time spent: 35 minutes  Author: ELVAN SOR. MD [facility] 02/05/2024 8:35 AM  To reach On-call, see care teams to locate the attending and reach out to them via [email or web address]. If 7PM-7AM, please contact night-coverage If you still have difficulty reaching the attending provider, please page the [facility] (Director on Call) for [facility] on amion for assistance.

## 2024-02-05 NOTE — Plan of Care (Signed)
   Problem: Education: Goal: Knowledge of General Education information will improve Description: Including pain rating scale, medication(s)/side effects and non-pharmacologic comfort measures Outcome: Progressing   Problem: Clinical Measurements: Goal: Will remain free from infection Outcome: Progressing   Problem: Clinical Measurements: Goal: Respiratory complications will improve Outcome: Progressing   Problem: Clinical Measurements: Goal: Cardiovascular complication will be avoided Outcome: Progressing   Problem: Pain Managment: Goal: General experience of comfort will improve and/or be controlled Outcome: Progressing   Problem: Safety: Goal: Ability to remain free from injury will improve Outcome: Progressing

## 2024-02-05 NOTE — Progress Notes (Signed)
 Civil engineer, contracting [facility]) Hospital Liaison Note  New referral for outpatient palliative consult post hospital discharge received from Allena Henry, TOC.  Spoke with patient and her husband at the bedside.  Both are in agreement.  Referral sent to [facility].  Thank you for allowing participation in this patient's care.  [name] HILARIO Layman OBIE Nurse Liaison [phone]

## 2024-02-05 NOTE — TOC Initial Note (Signed)
 Transition of Care [facility]) - Initial/Assessment Note    Patient Details  Name: Jasmine Malone MRN: [id number] Date of Birth: Sep 30, 1945  Transition of Care [facility]) CM/SW Contact:    [name], RN Phone Number: 02/05/2024, 2:57 PM  Clinical Narrative:   Chart reviewed. RNCM, spoke with the patient and daughters at the bedside. I introduced myself, my role, and explained that discharge planning recommendations would be discussed  Patient's daughters state that they are interested in setting up hospice for their mom and would like to use [facility]. [name] made aware. Pt has DME at home Environmental manager and Paediatric nurse). RNCM will continue to follow discharge for planning/care coordination and update as applicable.    Expected Discharge Plan: Home w Home Health Services Barriers to Discharge: Continued Medical Work up   Patient Goals and CMS Choice            Expected Discharge Plan and Services       Living arrangements for the past 2 months: Single Family Home                                      Prior Living Arrangements/Services Living arrangements for the past 2 months: Single Family Home Lives with:: Spouse Patient language and need for interpreter reviewed:: Yes Do you feel safe going back to the place where you live?: Yes      Need for Family Participation in Patient Care: Yes (Comment) Care giver support system in place?: Yes (comment) Current home services: DME Criminal Activity/Legal Involvement Pertinent to Current Situation/Hospitalization: No - Comment as needed  Activities of Daily Living   ADL Screening (condition at time of admission) Independently performs ADLs?: No Does the patient have a NEW difficulty with bathing/dressing/toileting/self-feeding that is expected to last >3 days?: Yes (Initiates electronic notice to provider for possible OT consult) Does the patient have a NEW difficulty with getting in/out of bed, walking, or climbing stairs that is  expected to last >3 days?: Yes (Initiates electronic notice to provider for possible PT consult) Does the patient have a NEW difficulty with communication that is expected to last >3 days?: No Is the patient deaf or have difficulty hearing?: Yes Does the patient have difficulty seeing, even when wearing glasses/contacts?: No  Permission Sought/Granted                  Emotional Assessment Appearance:: Well-Groomed, Appears stated age Attitude/Demeanor/Rapport: Engaged, Gracious Affect (typically observed): Calm, Quiet, Pleasant Orientation: : Oriented to Self, Oriented to Place, Oriented to  Time, Oriented to Situation Alcohol / Substance Use: Not Applicable Psych Involvement: No (comment)  Admission diagnosis:  Somnolence [R40.0] Pancreatic cyst [K86.2] UTI (urinary tract infection) [N39.0] Acute cystitis without hematuria [N30.00] Patient Active Problem List   Diagnosis Date Noted   UTI (urinary tract infection) 02/04/2024   Slow transit constipation 01/10/2024   Gait instability 12/23/2023   Parkinson's disease with dyskinesia and fluctuating manifestations (HCC) 12/23/2023   Primary osteoarthritis of left knee 12/23/2023   Chronic diastolic CHF (congestive heart failure) (HCC) 03/14/2023   Coronary artery disease involving native coronary artery of native heart without angina pectoris 03/14/2023   Memory loss 02/20/2023   Acute metabolic encephalopathy 02/18/2023   Obesity (BMI 30-39.9) 02/18/2023   Generalized weakness 02/18/2023   Acute heart failure with preserved ejection fraction (HFpEF) (HCC) 02/17/2023   Non-obstructive CAD 02/17/2023   HTN (hypertension) 02/09/2023  Hyperlipidemia, unspecified 02/09/2023   NSTEMI (non-ST elevated myocardial infarction) (HCC) 02/09/2023   Recurrent UTI 03/10/2022   Multinodular goiter 10/16/2021   Adrenal adenoma, right 10/15/2021   Thyroid cancer (HCC) 10/15/2021   Vocal fold paralysis, right 07/17/2021   Depression     Generalized anxiety disorder    Post traumatic stress disorder (PTSD)    Tremor    Class 2 obesity due to excess calories without serious comorbidity with body mass index (BMI) of 37.0 to 37.9 in adult 12/07/2016   Gastroesophageal reflux disease without esophagitis 12/07/2016   Urge incontinence of urine 12/07/2016   Post-menopausal bleeding 02/13/2014   PCP:  Claudene Rayfield HERO, MD Pharmacy:   [id number] [address], [place] - [address] [place] [address] Phone: [phone] Fax: [phone]     Social Drivers of Health (SDOH) Social History: SDOH Screenings   Food Insecurity: No Food Insecurity (02/04/2024)  Housing: Unknown (02/04/2024)  Transportation Needs: No Transportation Needs (02/04/2024)  Utilities: Not At Risk (02/04/2024)  Depression (PHQ2-9): Low Risk  (10/10/2019)  Financial Resource Strain: Medium Risk (06/13/2023)   Received from [facility]  Social Connections: Unknown (02/05/2024)  Tobacco Use: Low Risk  (02/04/2024)   SDOH Interventions:     Readmission Risk Interventions    02/19/2023   11:26 AM  Readmission Risk Prevention Plan  Post Dischage Appt Complete  Medication Screening Complete  Transportation Screening Complete

## 2024-02-05 NOTE — Evaluation (Signed)
 Physical Therapy Evaluation Patient Details Name: Jasmine Malone MRN: [id number] DOB: 09-15-1945 Today's Date: 02/05/2024  History of Present Illness  Jasmine Malone is a 78 y.o. female whose presentation is complicated by hypothyroidism secondary to thyroid cancer status post thyroidectomy, hypertension, hyperlipidemia, history of NSTEMI and CAD that presented with AMS and difficulty with producing speech.  Clinical Impression  Patient seen for initial PT evaluation due to decline in functional status. Patient is A&O x 4. Per chart review pt. has confusion intermittently, does not seem to become severely worse but she has been more fatigued more tired and slightly more confused than typical Baseline mobility reported as modI with occasional assistance from husband. Pt. recently discharged to home from [facility] on September 23, upon this admission pt. husband had to assist her back into bed due to weakness occasionally.   Pt. currently requiring minA for transfers and verbal cues for movement sequence for safe transitions sit <> stand. Gait assessed with RW able to ambulate 100 feet minA, limited by fatigue and R LE knee bucking. Pt lives with husband and daughter with 3 steps to enter with rail on L side. Pt safe d/c to home with expected continued improvement with acute level mobility assistance/rehab and stair navigation to safely enter home.   Clinical impression: patient presents with moderate mobility limitations secondary to cognitive decline and generalized weakness. Recommend skilled PT to address safety, mobility, and discharge planning.          If plan is discharge home, recommend the following: A little help with walking and/or transfers;A little help with bathing/dressing/bathroom   Can travel by private vehicle        Equipment Recommendations    Recommendations for Other Services       Functional Status Assessment Patient has had a recent decline in their functional status and  demonstrates the ability to make significant improvements in function in a reasonable and predictable amount of time.     Precautions / Restrictions Precautions Precautions: Fall Recall of Precautions/Restrictions: Impaired Restrictions Weight Bearing Restrictions Per Provider Order: No      Mobility  Bed Mobility                    Transfers Overall transfer level: Needs assistance Equipment used: Rolling walker (2 wheels) Transfers: Sit to/from Stand Sit to Stand: Min assist                Ambulation/Gait Ambulation/Gait assistance: Min assist Gait Distance (Feet): 100 Feet Assistive device: Rolling walker (2 wheels) Gait Pattern/deviations: Step-through pattern, Decreased stride length       General Gait Details: mild unsteadiness; R knee buckle; husband in room and mentioned that she has bone to bone and she recieves injections for pain  Stairs            Wheelchair Mobility     Tilt Bed    Modified Rankin (Stroke Patients Only)       Balance Overall balance assessment: Needs assistance Sitting-balance support: No upper extremity supported, Feet supported Sitting balance-Leahy Scale: Fair     Standing balance support: During functional activity, Reliant on assistive device for balance Standing balance-Leahy Scale: Fair Standing balance comment: able to stand at chair ~3 min. to take medicine; nurse family and NP in room; intermittently able to reach outside BOS and cervical flexion extension to end range to take meds without LOB not holding on to walker  High level balance activites: Side stepping, Direction changes, Turns               Pertinent Vitals/Pain Pain Assessment Pain Assessment: No/denies pain Pain Score: 0-No pain    Home Living Family/patient expects to be discharged to:: Private residence Living Arrangements: Spouse/significant other;Children Available Help at Discharge: Family Type of Home:  House           Home Equipment: Agricultural consultant (2 wheels);Grab bars - tub/shower;Shower seat;Other (comment);Rollator (4 wheels);Grab bars - toilet (toilet safety rails)      Prior Function Prior Level of Function : Needs assist  Cognitive Assist : Mobility (cognitive);ADLs (cognitive) Mobility (Cognitive): Intermittent cues ADLs (Cognitive): Step by step cues Physical Assist : Mobility (physical);ADLs (physical) Mobility (physical): Bed mobility;Transfers;Gait ADLs (physical): Grooming;Bathing;Dressing;Toileting Mobility Comments: Spouse reports pt predominantly walked with FWW in the home, but does have rollator; no wc, and ambulatory for short distances in community to enter a medical office ADLs Comments: Spouse and adult children assisted with all basic ADLs d/t hx of Lewey Body dementia.     Extremity/Trunk Assessment   Upper Extremity Assessment Upper Extremity Assessment: Generalized weakness    Lower Extremity Assessment Lower Extremity Assessment: Generalized weakness    Cervical / Trunk Assessment Cervical / Trunk Assessment: Normal  Communication   Communication Communication: Impaired Factors Affecting Communication: Difficulty expressing self    Cognition Arousal: Alert Behavior During Therapy: WFL for tasks assessed/performed                             Following commands: Impaired Following commands impaired: Follows one step commands inconsistently     Cueing Cueing Techniques: Verbal cues, Tactile cues     General Comments      Exercises Other Exercises Other Exercises: pt and famliy education on PT roles and importance of mobility   Assessment/Plan    PT Assessment Patient needs continued PT services  PT Problem List Decreased strength;Decreased range of motion;Decreased activity tolerance;Decreased balance;Decreased mobility;Decreased coordination;Decreased knowledge of use of DME       PT Treatment Interventions DME  instruction;Gait training;Stair training;Functional mobility training;Therapeutic activities;Therapeutic exercise;Balance training;Neuromuscular re-education;Patient/family education    PT Goals (Current goals can be found in the Care Plan section)  Acute Rehab PT Goals Patient Stated Goal: pt wants to go home PT Goal Formulation: With patient/family Time For Goal Achievement: 02/20/24 Potential to Achieve Goals: Fair    Frequency Min 2X/week     Co-evaluation               AM-PAC PT 6 Clicks Mobility  Outcome Measure Help needed turning from your back to your side while in a flat bed without using bedrails?: None Help needed moving from lying on your back to sitting on the side of a flat bed without using bedrails?: A Little Help needed moving to and from a bed to a chair (including a wheelchair)?: A Little Help needed standing up from a chair using your arms (e.g., wheelchair or bedside chair)?: A Little Help needed to walk in hospital room?: A Little Help needed climbing 3-5 steps with a railing? : Total 6 Click Score: 17    End of Session Equipment Utilized During Treatment: Gait belt Activity Tolerance: Patient tolerated treatment well Patient left: in chair;with family/visitor present;with nursing/sitter in room Nurse Communication: Mobility status PT Visit Diagnosis: Unsteadiness on feet (R26.81);Other abnormalities of gait and mobility (R26.89);Muscle weakness (generalized) (M62.81);History of falling (Z91.81);Difficulty in  walking, not elsewhere classified (R26.2);Other symptoms and signs involving the nervous system (R29.898)    Time: 1052-1110 PT Time Calculation (min) (ACUTE ONLY): 18 min   Charges:   PT Evaluation $PT Eval Low Complexity: 1 Low   PT General Charges $$ ACUTE PT VISIT: 1 Visit         Sherlean Lesches DPT, PT    Sherlean A Monterrius Cardosa 02/05/2024, 11:53 AM

## 2024-02-06 ENCOUNTER — Other Ambulatory Visit: Payer: Self-pay

## 2024-02-06 DIAGNOSIS — Z515 Encounter for palliative care: Secondary | ICD-10-CM | POA: Diagnosis not present

## 2024-02-06 DIAGNOSIS — R400 Somnolence: Secondary | ICD-10-CM | POA: Diagnosis not present

## 2024-02-06 DIAGNOSIS — Z66 Do not resuscitate: Secondary | ICD-10-CM | POA: Diagnosis not present

## 2024-02-06 DIAGNOSIS — N3000 Acute cystitis without hematuria: Secondary | ICD-10-CM | POA: Diagnosis not present

## 2024-02-06 DIAGNOSIS — Z7189 Other specified counseling: Secondary | ICD-10-CM | POA: Diagnosis not present

## 2024-02-06 LAB — BASIC METABOLIC PANEL WITH GFR
Anion gap: 7 (ref 5–15)
BUN: 22 mg/dL (ref 8–23)
CO2: 25 mmol/L (ref 22–32)
Calcium: 8.3 mg/dL — ABNORMAL LOW (ref 8.9–10.3)
Chloride: 109 mmol/L (ref 98–111)
Creatinine, Ser: 0.85 mg/dL (ref 0.44–1.00)
GFR, Estimated: >60 mL/min (ref >60)
Glucose, Bld: 90 mg/dL (ref 70–99)
Potassium: 3.9 mmol/L (ref 3.5–5.1)
Sodium: 141 mmol/L (ref 135–145)

## 2024-02-06 LAB — CBC
HCT: 31.8 % — ABNORMAL LOW (ref 36.0–46.0)
Hemoglobin: 10.9 g/dL — ABNORMAL LOW (ref 12.0–15.0)
MCH: 34.4 pg — ABNORMAL HIGH (ref 26.0–34.0)
MCHC: 34.3 g/dL (ref 30.0–36.0)
MCV: 100.3 fL — ABNORMAL HIGH (ref 80.0–100.0)
Platelets: 150 K/uL (ref 150–400)
RBC: 3.17 MIL/uL — ABNORMAL LOW (ref 3.87–5.11)
RDW: 12.6 % (ref 11.5–15.5)
WBC: 7.4 K/uL (ref 4.0–10.5)
nRBC: 0 % (ref 0.0–0.2)

## 2024-02-06 LAB — PHOSPHORUS: Phosphorus: 3.2 mg/dL (ref 2.5–4.6)

## 2024-02-06 LAB — FOLATE: Folate: 18.4 ng/mL (ref >5.9)

## 2024-02-06 LAB — MAGNESIUM: Magnesium: 2.1 mg/dL (ref 1.7–2.4)

## 2024-02-06 MED ORDER — AMOXICILLIN-POT CLAVULANATE 875-125 MG PO TABS
1.0000 | ORAL_TABLET | Freq: Two times a day (BID) | ORAL | 0 refills | Status: AC
  Filled 2024-02-06: qty 6, 3d supply, fill #0

## 2024-02-06 MED ORDER — BISACODYL 5 MG PO TBEC
10.0000 mg | DELAYED_RELEASE_TABLET | Freq: Every day | ORAL | 0 refills | Status: AC
  Filled 2024-02-06: qty 30, 15d supply, fill #0

## 2024-02-06 MED ORDER — BISACODYL 10 MG RE SUPP
10.0000 mg | Freq: Every day | RECTAL | 0 refills | Status: AC | PRN
  Filled 2024-02-06: qty 12, 12d supply, fill #0

## 2024-02-06 MED ORDER — SODIUM CHLORIDE 0.9 % IV SOLN
1.0000 g | INTRAVENOUS | Status: AC
  Administered 2024-02-06: 1 g via INTRAVENOUS
  Filled 2024-02-06: qty 10

## 2024-02-06 MED ORDER — POLYETHYLENE GLYCOL 3350 17 GM/SCOOP PO POWD
17.0000 g | Freq: Two times a day (BID) | ORAL | 0 refills | Status: AC
  Filled 2024-02-06: qty 510, 15d supply, fill #0

## 2024-02-06 NOTE — Progress Notes (Signed)
 Mobility Specialist - Progress Note   02/06/24 1000  Mobility  Activity Ambulated with assistance  Level of Assistance Modified independent, requires aide device or extra time  Assistive Device Front wheel walker  Distance Ambulated (ft) 60 ft  Range of Motion/Exercises Active  Activity Response Tolerated well  Mobility visit 1 Mobility  Mobility Specialist Start Time (ACUTE ONLY) 1030  Mobility Specialist Stop Time (ACUTE ONLY) 1056  Mobility Specialist Time Calculation (min) (ACUTE ONLY) 26 min   Pt was supine in the bed upon entry. Pt agreed to mobility. Pt was able to EOB independently. Pt was able to STS independently. Pt did state that she wears a (L) knee brace at home. Pt was able to ambulate back and forth from opposite end of the bed near window multiple times. After activity pt returned to the bed. Nurse and physician in the room after activity. Needs are in reach.  Clem Rodes Mobility Specialist 02/06/24, 11:02 AM

## 2024-02-06 NOTE — Plan of Care (Signed)

## 2024-02-06 NOTE — Progress Notes (Signed)
 Palliative Care Progress Note, Assessment & Plan   Patient Name: Jasmine Malone       Date: 02/06/2024 DOB: 09-Oct-1945  Age: 78 y.o. MRN#: [id number] Attending Physician: Von Bellis, MD Primary Care Physician: Claudene Rayfield HERO, MD Admit Date: 02/04/2024  Subjective: Reports feeling better than yesterday.  Was able to eat some of her breakfast and drink Ensure.  Patient reports not sleeping well last night.  She denies pain.  Last BM was yesterday.  Husband shares patient may go home today.  HPI: 78 y.o. female  with past medical history significant for Lewy body dementia, GERD, depression, HTN, HLD, HFpEF (>55%), thyroid cancer and obesity. Patient presented from home to ED 02/04/2024 c/o generalized weakness with extreme fatigue, 1 episode of non bloody emesis and confusion.    Of note, patient was recently admitted to [facility] 01/31/24 with NSTEMI and hyperkalemia. Workup denotes MRI study that showed no acute intracranial abnormality. Further workup during that admission was concerning for Parkinson's and possible Lewy body dementia. Family reports that patient was considerably weak at discharge but insurance did not qualify her for rehab and went home instead.    ED labs significant for calcium  8.7, albumin 3.1, RBC 3.61, MCH 34.1. UA with Perkins hgb, positive nitrite, moderate leukocytes and many bacteria. Troponin 26.  CT AP  IMPRESSION: 1. No evidence of bowel obstruction. 2. Moderate to large amount amount of stool in the rectal vault, possible fecal impaction. 3. Diverticulosis without diverticulitis. 4. Tonche hiatal hernia. 5. 1.6 cm cyst in the neck of the pancreas. One year for follow-up CT or MRI is recommended to document stability.   CT Head showed no acute intracranial abnormality.    TRH was  consulted for admission and management of acute metabolic encephalopathy and UTI.    Palliative care was consulted to assist with goals of care conversations.     Summary of counseling/coordination of care: Extensive chart review completed prior to meeting patient including labs, vital signs, imaging, progress notes, orders, and available advanced directive documents from current and previous encounters.     Latest Ref Rng & Units 02/06/2024    4:03 AM 02/04/2024    5:06 PM 02/19/2023    5:22 AM  CBC  WBC 4.0 - 10.5 K/uL 7.4  8.5  8.6   Hemoglobin 12.0 - 15.0 g/dL 89.0  87.6  85.6   Hematocrit 36.0 - 46.0 % 31.8  36.0  41.7   Platelets 150 - 400 K/uL 150  181  168       Latest Ref Rng & Units 02/06/2024    4:03 AM 02/05/2024    4:29 AM 02/04/2024    5:06 PM  CMP  Glucose 70 - 99 mg/dL 90  87  894   BUN 8 - 23 mg/dL 22  22  21    Creatinine 0.44 - 1.00 mg/dL 9.14  9.09  9.13   Sodium 135 - 145 mmol/L 141  144  142   Potassium 3.5 - 5.1 mmol/L 3.9  3.5  3.9   Chloride 98 - 111 mmol/L 109  109  108   CO2 22 - 32 mmol/L 25  24  25    Calcium  8.9 -  10.3 mg/dL 8.3  8.5  8.7   Total Protein 6.5 - 8.1 g/dL  5.9  6.6   Total Bilirubin 0.0 - 1.2 mg/dL  0.8  1.0   Alkaline Phos 38 - 126 U/L  69  74   AST 15 - 41 U/L  26  29   ALT 0 - 44 U/L  7  26      After reviewing the patient's chart and assessing the patient at bedside, I spoke with patient and husband in regards to symptom management and goals of care.   Ill-appearing, elderly female lying in bed with husband at bedside.  She is alert and oriented to self, time, location, situation and current president.  She is much more alert and appears less weak today.  Respirations are even and unlabored.  She is in no distress.  Discussed with patient and husband the importance of nutrition and role of protein in diet.  Mr. Raju shares that he will buy protein shakes, yogurt and eggs so patient will have appropriate amounts of protein in her  diet. Patient endorses wanting to go home today but is unsure how she will do because she does not want to return to the hospital.  When asked about strength and less weakness today, patient shares that she feels stronger and was able to walk a longer distance today and even walk to restroom.  Reassured patient that this is a good sign and she may do well after arriving home but needs to continue to focus on walking as tolerated and nutrition.  Therapeutic silence and active listening provided for patient and family to share their thoughts and emotions regarding current medical situation.  Emotional support provided.  Physical Exam Vitals reviewed.  Constitutional:      General: She is not in acute distress.    Appearance: She is ill-appearing.  HENT:     Head: Normocephalic and atraumatic.     Mouth/Throat:     Mouth: Mucous membranes are moist.  Pulmonary:     Effort: Pulmonary effort is normal. No respiratory distress.  Musculoskeletal:     Right lower leg: Edema present.     Left lower leg: Edema present.  Skin:    General: Skin is warm and dry.  Neurological:     Mental Status: She is alert and oriented to person, place, and time.  Psychiatric:        Mood and Affect: Mood normal.        Behavior: Behavior normal.        Thought Content: Thought content normal.        Judgment: Judgment normal.    Recommendations/Plan: DNR/DNI Continue current supportive interventions Plan to d/c home with outpatient palliative care following        Total Time 65 minutes    Time spent includes: Detailed review of medical records (labs, imaging, vital signs), medically appropriate exam (mental status, respiratory, cardiac, skin), discussed with treatment team, counseling and educating patient, family and staff, documenting clinical information, medication management and coordination of care.     Devere Sacks, AMANDA [facility] Palliative Medicine Team  02/06/2024 12:41 PM  Office [phone]   Pager [phone]

## 2024-02-06 NOTE — Plan of Care (Signed)
  Problem: Education: Goal: Knowledge of General Education information will improve Description: Including pain rating scale, medication(s)/side effects and non-pharmacologic comfort measures Outcome: Progressing   Problem: Clinical Measurements: Goal: Ability to maintain clinical measurements within normal limits will improve Outcome: Progressing   Problem: Clinical Measurements: Goal: Will remain free from infection Outcome: Progressing   Problem: Clinical Measurements: Goal: Diagnostic test results will improve Outcome: Progressing   Problem: Clinical Measurements: Goal: Respiratory complications will improve Outcome: Progressing   Problem: Clinical Measurements: Goal: Cardiovascular complication will be avoided Outcome: Progressing   Problem: Activity: Goal: Risk for activity intolerance will decrease Outcome: Progressing   Problem: Nutrition: Goal: Adequate nutrition will be maintained Outcome: Progressing   Problem: Coping: Goal: Level of anxiety will decrease Outcome: Progressing

## 2024-02-06 NOTE — Discharge Summary (Signed)
 [facility] Discharge Summary   Patient: Jasmine Malone FMW:[id number]  PCP: Jasmine Rayfield HERO, MD  Date of admission: 02/04/2024   Date of discharge:  02/06/2024     Discharge Diagnoses:  Principal Problem:   UTI (urinary tract infection) Active Problems:   Acute metabolic encephalopathy   Gastroesophageal reflux disease without esophagitis   Depression   Tremor   HTN (hypertension)   Hyperlipidemia, unspecified   Thyroid cancer (HCC)   Chronic diastolic CHF (congestive heart failure) (HCC)   Admitted From: Home Disposition:  Home   Recommendations for Outpatient Follow-up:  PCP: in 1 wk  Follow up LABS/TEST: CT or MRI in 1 year for pancreatic cyst.   Follow-up Information     Jasmine Rayfield HERO, MD Follow up.   Specialty: Family Medicine Contact information: [address] [place] [address] [phone]                Diet recommendation: Cardiac diet  Activity: The patient is advised to gradually reintroduce usual activities, as tolerated  Discharge Condition: stable  Code Status: DNR-limited  History of present illness: As per the H and P dictated on admission.  Hospital Course:  Jasmine Malone is a 78 y.o. female with medical history significant of Lewy body dementia, GERD, depression, HTN, HLD, history of thyroid cancer, history of CHFpEF, obesity who was recently admitted to [facility] with altered mental status and had a fairly lengthy stay and workup there.  Patient was noted to have some elevated troponins and had consultation with neurology who felt like she had an acute metabolic cause versus worsening dementia since she seemed to regain her mental status at that time.  She was doing well for the last 5 days at home until last evening when she had an episode of emesis and again seem to have lost her memory.  Today she had generalized weakness and the inability to ambulate was brought to the ED.  In the ED she had a negative CT of the head and  abdomen labs were essentially normal except for urinalysis which was indicative of probable UTI with nitrates moderate leuks and many bacteria.  Her last urine culture grew Citrobacter.      Assessment and Plan:   # Acute metabolic encephalopathy Unclear etiology, likely related to UTI Continue supportive care and treat UTI This has been recurrent on several occasions in patient who has possible Parkinson's versus Lewy body dementia.  According to the husband they are more leaning toward a diagnosis of Lewy body dementia. Palliative care consult. Encephalopathy resolved.  Patient is AO x 3    # UTI, UA positive. S/p IV Rocephin given during hospital stay.  Urine culture growing Citrobacter koseri, pansensitive.  Patient was discharged on Augmentin  twice daily for 3 additional days to complete 5-day course.   # Chronic diastolic CHF (congestive heart failure) (HCC) Last EF at [facility] was 55%. No signs of fluid overload at present   # Thyroid cancer: With subsequent hypothyroidism Recent normal TSH. Continue Synthroid    # Hyperlipidemia, unspecified:  Continue omega-3's, Low cholesterol diet   # HTN (hypertension): Continue ARB   # Tremor: Continued Sinemet  home dose   # Depression: Continue Prozac    # Gastroesophageal reflux disease without esophagitis Met down hairstyle continue PPI   # Constipation: Resolved Started laxatives   # Pancreatic cyst, incidental finding 1.6 cm cyst in the neck of the pancreas. One year for follow-up CT or MRI is recommended to document stability.  Body mass index is 36.58 kg/m.  Nutrition Interventions:  Patient was ambulatory without any assistance. On the day of the discharge the patient's vitals were stable, and no other acute medical condition were reported by patient. the patient was felt safe to be discharge at Home.  Consultants: None Procedures: None  Discharge Exam: General: Appear in no distress, Oral Mucosa Clear,  moist. Cardiovascular: S1 and S2 Present, no Murmur, Respiratory: normal respiratory effort, Bilateral Air entry present and no Crackles, no wheezes Abdomen: Bowel Sound present, Soft and no tenderness. Extremities: no Pedal edema, no calf tenderness Neurology: alert and oriented to time, place, and person affect appropriate.  Filed Weights   02/04/24 1701 02/04/24 2108  Weight: 113.2 kg 99.7 kg   Vitals:   02/06/24 0448 02/06/24 0803  BP: 128/64 (!) 142/58  Pulse: 60 (!) 58  Resp: 16 18  Temp: 98 F (36.7 C) 97.9 F (36.6 C)  SpO2: 96% 98%    DISCHARGE MEDICATION: Allergies as of 02/06/2024       Reactions   Codeine Nausea Only   Can take Percocet with Zofran    Ibuprofen Other (See Comments)   Irritates stomach due to hiatal hernia   Shellfish Allergy Nausea Only        Medication List     TAKE these medications    albuterol  108 (90 Base) MCG/ACT inhaler Commonly known as: VENTOLIN  HFA Inhale 2 puffs into the lungs every 6 (six) hours as needed for wheezing or shortness of breath (cough, shortness of breath or wheezing.).   amoxicillin -clavulanate 875-125 MG tablet Commonly known as: AUGMENTIN  Take 1 tablet by mouth 2 (two) times daily for 3 days. Start taking on: February 07, 2024   aspirin EC 81 MG tablet Take 81 mg by mouth daily. Swallow whole.   bisacodyl 5 MG EC tablet Commonly known as: DULCOLAX Take 2 tablets (10 mg total) by mouth at bedtime.   bisacodyl 10 MG suppository Commonly known as: DULCOLAX Place 1 suppository (10 mg total) rectally daily as needed for severe constipation.   carbidopa -levodopa  25-100 MG tablet Commonly known as: SINEMET  IR TAKE 1 TABLET BY MOUTH 3 (THREE) TIMES DAILY. 8AM/NOON/4PM   feeding supplement Liqd Take 237 mLs by mouth 3 (three) times daily between meals.   FENUGREEK PO Take 1 capsule by mouth daily as needed (for congestion).   FISH OIL PO Take 2 capsules by mouth daily.   FLUoxetine  20 MG  tablet Commonly known as: PROZAC  Take 20 mg by mouth daily.   Ginkgo Biloba 40 MG Tabs Take 40 mg by mouth daily.   levothyroxine  88 MCG tablet Commonly known as: SYNTHROID  Take 88 mcg by mouth daily before breakfast.   MSM GLUCOSAMINE COMPLEX PO Take 1 capsule by mouth daily.   multivitamin with minerals Tabs tablet Take 1 tablet by mouth daily.   olmesartan 20 MG tablet Commonly known as: BENICAR Take 20 mg by mouth daily.   pantoprazole  40 MG tablet Commonly known as: PROTONIX  TAKE 1 TABLET (40 MG TOTAL) BY MOUTH DAILY.   polyethylene glycol 17 g packet Commonly known as: MIRALAX / GLYCOLAX Take 17 g by mouth 2 (two) times daily.       Allergies  Allergen Reactions   Codeine Nausea Only    Can take Percocet with Zofran    Ibuprofen Other (See Comments)    Irritates stomach due to hiatal hernia    Shellfish Allergy Nausea Only   Discharge Instructions     Call MD for:  difficulty breathing, headache or visual disturbances   Complete by: As directed    Call MD for:  extreme fatigue   Complete by: As directed    Call MD for:  persistant dizziness or light-headedness   Complete by: As directed    Call MD for:  persistant nausea and vomiting   Complete by: As directed    Call MD for:  severe uncontrolled pain   Complete by: As directed    Call MD for:  temperature >100.4   Complete by: As directed    Diet - low sodium heart healthy   Complete by: As directed    Discharge instructions   Complete by: As directed    Follow-up with PCP in 1 week   Increase activity slowly   Complete by: As directed        The results of significant diagnostics from this hospitalization (including imaging, microbiology, ancillary and laboratory) are listed below for reference.    Significant Diagnostic Studies: CT ABDOMEN PELVIS W CONTRAST Result Date: 02/04/2024 CLINICAL DATA:  Vomiting, bowel obstruction suspected. EXAM: CT ABDOMEN AND PELVIS WITH CONTRAST TECHNIQUE:  Multidetector CT imaging of the abdomen and pelvis was performed using the standard protocol following bolus administration of intravenous contrast. RADIATION DOSE REDUCTION: This exam was performed according to the departmental dose-optimization program which includes automated exposure control, adjustment of the mA and/or kV according to patient size and/or use of iterative reconstruction technique. CONTRAST:  100mL OMNIPAQUE IOHEXOL 300 MG/ML  SOLN COMPARISON:  None Available. FINDINGS: Lower chest: No acute abnormality. Hepatobiliary: No focal liver abnormality is seen. Status post cholecystectomy. No biliary dilatation. Pancreas: There is a cystic lesion in the pancreatic neck measuring 1.6 cm. No pancreatic ductal dilatation or surrounding inflammatory changes. Spleen: Normal in size without focal abnormality. Adrenals/Urinary Tract: The adrenal glands are within normal limits. The kidneys enhance symmetrically. No renal calculus or hydronephrosis bilaterally. The bladder is unremarkable. Stomach/Bowel: There is a Sample hiatal hernia. The stomach is otherwise within normal limits. No bowel obstruction, free air, or pneumatosis. Scattered diverticula are present along the colon without evidence of diverticulitis. There is a large amount of stool in the rectal vault. A fat containing paraumbilical hernia is noted containing a portion of the sigmoid colon. Appendix appears normal. Vascular/Lymphatic: No significant vascular findings are present. No enlarged abdominal or pelvic lymph nodes. Reproductive: Uterus and bilateral adnexa are unremarkable. Other: No abdominopelvic ascites. Musculoskeletal: Degenerative changes are present in the thoracolumbar spine. No acute osseous abnormality is seen. IMPRESSION: 1. No evidence of bowel obstruction. 2. Moderate to large amount amount of stool in the rectal vault, possible fecal impaction. 3. Diverticulosis without diverticulitis. 4. Ruggiero hiatal hernia. 5. 1.6 cm cyst  in the neck of the pancreas. One year for follow-up CT or MRI is recommended to document stability. Electronically Signed   By: Leita Birmingham M.D.   On: 02/04/2024 18:37   CT Head Wo Contrast Result Date: 02/04/2024 EXAM: CT HEAD WITHOUT CONTRAST 02/04/2024 06:14:32 PM TECHNIQUE: CT of the head was performed without the administration of intravenous contrast. Automated exposure control, iterative reconstruction, and/or weight based adjustment of the mA/kV was utilized to reduce the radiation dose to as low as reasonably achievable. COMPARISON: Comparison with 02/17/2023. CLINICAL HISTORY: Delirium. Pt BIB [facility] from home for AMS, more than baseline. Pt recently seen at [facility] was diagnosed with Lewybody Dementia. Pt is incontinent or urine. EMS reports pt feels tired, started last night, vomited last night as well. Vitals 130/70  70 NSR pac, 98% RA. FINDINGS: BRAIN AND VENTRICLES: Atrophy. No acute hemorrhage. No evidence of acute infarct. No hydrocephalus. No extra-axial collection. No mass effect or midline shift. Chronic microvascular ischemia and generalized atrophy. ORBITS: No acute abnormality. SINUSES: No acute abnormality. SOFT TISSUES AND SKULL: No acute soft tissue abnormality. No skull fracture. IMPRESSION: 1. No acute intracranial abnormality. Electronically signed by: Norman Gatlin MD 02/04/2024 06:36 PM EDT RP Workstation: [id number]    Microbiology: Recent Results (from the past 240 hours)  Urine Culture     Status: Abnormal (Preliminary result)   Collection Time: 02/04/24  5:03 PM   Specimen: Urine, Clean Catch  Result Value Ref Range Status   Specimen Description   Final    URINE, CLEAN CATCH Performed at [facility], [address], [place], [place] [address]    Special Requests   Final    NONE Performed at [facility], [address] [place], [place] [address]    Culture (A)  Final    >=100,000 COLONIES/mL CITROBACTER KOSERI 80,000 COLONIES/mL KLEBSIELLA  PNEUMONIAE SUSCEPTIBILITIES TO FOLLOW Performed at [facility], [address] [place], [place] [address]    Report Status PENDING  Incomplete   Organism ID, Bacteria CITROBACTER KOSERI (A)  Final      Susceptibility   Citrobacter koseri - MIC*    CEFEPIME <=0.12 SENSITIVE Sensitive     ERTAPENEM <=0.12 SENSITIVE Sensitive     CEFTRIAXONE <=0.25 SENSITIVE Sensitive     CIPROFLOXACIN <=0.06 SENSITIVE Sensitive     GENTAMICIN  <=1 SENSITIVE Sensitive     NITROFURANTOIN 32 SENSITIVE Sensitive     TRIMETH/SULFA <=20 SENSITIVE Sensitive     PIP/TAZO Value in next row Sensitive      <=4 SENSITIVEThis is a modified FDA-approved test that has been validated and its performance characteristics determined by the reporting laboratory.  This laboratory is certified under the Clinical Laboratory Improvement Amendments CLIA as qualified to perform high complexity clinical laboratory testing.    MEROPENEM Value in next row Sensitive      <=4 SENSITIVEThis is a modified FDA-approved test that has been validated and its performance characteristics determined by the reporting laboratory.  This laboratory is certified under the Clinical Laboratory Improvement Amendments CLIA as qualified to perform high complexity clinical laboratory testing.    * >=100,000 COLONIES/mL CITROBACTER KOSERI     Labs: CBC: Recent Labs  Lab 02/04/24 1706 02/06/24 0403  WBC 8.5 7.4  HGB 12.3 10.9*  HCT 36.0 31.8*  MCV 99.7 100.3*  PLT 181 150   Basic Metabolic Panel: Recent Labs  Lab 02/04/24 1706 02/05/24 0429 02/06/24 0403  NA 142 144 141  K 3.9 3.5 3.9  CL 108 109 109  CO2 25 24 25   GLUCOSE 105* 87 90  BUN 21 22 22   CREATININE 0.86 0.90 0.85  CALCIUM  8.7* 8.5* 8.3*  MG  --  2.1 2.1  PHOS  --  2.8 3.2   Liver Function Tests: Recent Labs  Lab 02/04/24 1706 02/05/24 0429  AST 29 26  ALT 26 7  ALKPHOS 74 69  BILITOT 1.0 0.8  PROT 6.6 5.9*  ALBUMIN 3.1* 2.9*   No results for input(s):  LIPASE, AMYLASE in the last 168 hours. No results for input(s): AMMONIA in the last 168 hours. Cardiac Enzymes: No results for input(s): CKTOTAL, CKMB, CKMBINDEX, TROPONINI in the last 168 hours. BNP (last 3 results) Recent Labs    02/17/23 1340  BNP 163.7*   CBG:  No results for input(s): GLUCAP in the last 168 hours.  Time spent: 35 minutes  Signed:  Elvan Sor  [facility] 02/06/2024 1:11 PM

## 2024-02-06 NOTE — Progress Notes (Signed)
 Mobility Specialist - Progress Note   02/06/24 1410  Mobility  Activity Ambulated with assistance  Level of Assistance Standby assist, set-up cues, supervision of patient - no hands on  Assistive Device Front wheel walker  Distance Ambulated (ft) 24 ft  Activity Response Tolerated well  Mobility visit 1 Mobility  Mobility Specialist Start Time (ACUTE ONLY) 1353  Mobility Specialist Stop Time (ACUTE ONLY) 1403  Mobility Specialist Time Calculation (min) (ACUTE ONLY) 10 min   Pt amb to/from the bathroom SBA, tolerated well-- steady gait. Pt returned to bed, left semi fowler with alarm set and needs within reach.  America Silvan Mobility Specialist 02/06/24 2:31 PM

## 2024-02-06 NOTE — TOC Progression Note (Signed)
 Transition of Care [facility]) - Progression Note    Patient Details  Name: Jasmine Malone MRN: [id number] Date of Birth: 1946-02-17  Transition of Care [facility]) CM/SW Contact  Alfonso Rummer, LCSW Phone Number: 02/06/2024, 3:06 PM  Clinical Narrative:     KEN DELENA Rummer completed TOC Chart review. Pt will dx home with [facility]. Marinell with [facility] confirmed referral was received. No further TOC needs identified pls contact should further needs arise.   Expected Discharge Plan: Home w Home Health Services Barriers to Discharge: Continued Medical Work up               Expected Discharge Plan and Services       Living arrangements for the past 2 months: Single Family Home Expected Discharge Date: 02/06/24                                     Social Drivers of Health (SDOH) Interventions SDOH Screenings   Food Insecurity: No Food Insecurity (02/04/2024)  Housing: Unknown (02/04/2024)  Transportation Needs: No Transportation Needs (02/04/2024)  Utilities: Not At Risk (02/04/2024)  Depression (PHQ2-9): Low Risk  (10/10/2019)  Financial Resource Strain: Medium Risk (06/13/2023)   Received from [facility]  Social Connections: Unknown (02/05/2024)  Tobacco Use: Low Risk  (02/04/2024)    Readmission Risk Interventions    02/19/2023   11:26 AM  Readmission Risk Prevention Plan  Post Dischage Appt Complete  Medication Screening Complete  Transportation Screening Complete

## 2024-02-06 NOTE — Care Management Obs Status (Signed)
 MEDICARE OBSERVATION STATUS NOTIFICATION   Patient Details  Name: Jasmine Malone MRN: [id number] Date of Birth: [birth year]   Medicare Observation Status Notification Given:  Yes    Rojelio SHAUNNA Rattler 02/06/2024, 11:09 AM

## 2024-02-07 LAB — URINE CULTURE: Culture: >=100000 — AB

## 2024-02-16 ENCOUNTER — Other Ambulatory Visit: Payer: Self-pay

## 2024-02-16 ENCOUNTER — Emergency Department

## 2024-02-16 ENCOUNTER — Encounter: Payer: Self-pay | Admitting: Emergency Medicine

## 2024-02-16 ENCOUNTER — Emergency Department: Admission: EM | Admit: 2024-02-16 | Discharge: 2024-02-16 | Disposition: A | Discharge status: Home / Self Care

## 2024-02-16 DIAGNOSIS — G3183 Dementia with Lewy bodies: Secondary | ICD-10-CM | POA: Diagnosis not present

## 2024-02-16 DIAGNOSIS — G9340 Encephalopathy, unspecified: Secondary | ICD-10-CM | POA: Diagnosis not present

## 2024-02-16 DIAGNOSIS — I110 Hypertensive heart disease with heart failure: Secondary | ICD-10-CM | POA: Diagnosis not present

## 2024-02-16 DIAGNOSIS — Z85850 Personal history of malignant neoplasm of thyroid: Secondary | ICD-10-CM | POA: Insufficient documentation

## 2024-02-16 DIAGNOSIS — I5030 Unspecified diastolic (congestive) heart failure: Secondary | ICD-10-CM | POA: Diagnosis not present

## 2024-02-16 DIAGNOSIS — F0280 Dementia in other diseases classified elsewhere without behavioral disturbance: Secondary | ICD-10-CM | POA: Insufficient documentation

## 2024-02-16 DIAGNOSIS — R410 Disorientation, unspecified: Secondary | ICD-10-CM | POA: Diagnosis present

## 2024-02-16 LAB — COMPREHENSIVE METABOLIC PANEL WITH GFR
ALT: 8 U/L (ref 0–44)
AST: 34 U/L (ref 15–41)
Albumin: 3.5 g/dL (ref 3.5–5.0)
Alkaline Phosphatase: 71 U/L (ref 38–126)
Anion gap: 8 (ref 5–15)
BUN: 23 mg/dL (ref 8–23)
CO2: 26 mmol/L (ref 22–32)
Calcium: 9.4 mg/dL (ref 8.9–10.3)
Chloride: 106 mmol/L (ref 98–111)
Creatinine, Ser: 0.95 mg/dL (ref 0.44–1.00)
GFR, Estimated: >60 mL/min (ref >60)
Glucose, Bld: 108 mg/dL — ABNORMAL HIGH (ref 70–99)
Potassium: 4 mmol/L (ref 3.5–5.1)
Sodium: 140 mmol/L (ref 135–145)
Total Bilirubin: 1 mg/dL (ref 0.0–1.2)
Total Protein: 7.1 g/dL (ref 6.5–8.1)

## 2024-02-16 LAB — CBC
HCT: 37.2 % (ref 36.0–46.0)
Hemoglobin: 12.7 g/dL (ref 12.0–15.0)
MCH: 33.8 pg (ref 26.0–34.0)
MCHC: 34.1 g/dL (ref 30.0–36.0)
MCV: 98.9 fL (ref 80.0–100.0)
Platelets: 201 K/uL (ref 150–400)
RBC: 3.76 MIL/uL — ABNORMAL LOW (ref 3.87–5.11)
RDW: 12.7 % (ref 11.5–15.5)
WBC: 6.9 K/uL (ref 4.0–10.5)
nRBC: 0 % (ref 0.0–0.2)

## 2024-02-16 LAB — BLOOD GAS, VENOUS
Acid-Base Excess: 4.9 mmol/L — ABNORMAL HIGH (ref 0.0–2.0)
Bicarbonate: 29.2 mmol/L — ABNORMAL HIGH (ref 20.0–28.0)
O2 Saturation: 71.9 %
Patient temperature: 37
pCO2, Ven: 41 mmHg — ABNORMAL LOW (ref 44–60)
pH, Ven: 7.46 — ABNORMAL HIGH (ref 7.25–7.43)
pO2, Ven: 43 mmHg (ref 32–45)

## 2024-02-16 LAB — URINALYSIS, ROUTINE W REFLEX MICROSCOPIC
Bilirubin Urine: NEGATIVE
Glucose, UA: NEGATIVE mg/dL
Hgb urine dipstick: NEGATIVE
Ketones, ur: NEGATIVE mg/dL
Leukocytes,Ua: NEGATIVE
Nitrite: NEGATIVE
Protein, ur: NEGATIVE mg/dL
Specific Gravity, Urine: 1.017 (ref 1.005–1.030)
pH: 6 (ref 5.0–8.0)

## 2024-02-16 LAB — PROCALCITONIN: Procalcitonin: <0.1 ng/mL

## 2024-02-16 LAB — LACTIC ACID, PLASMA: Lactic Acid, Venous: 2 mmol/L (ref 0.5–1.9)

## 2024-02-16 MED ORDER — SODIUM CHLORIDE 0.9 % IV BOLUS
1000.0000 mL | Freq: Once | INTRAVENOUS | Status: AC
  Administered 2024-02-16: 1000 mL via INTRAVENOUS

## 2024-02-16 MED ORDER — SODIUM CHLORIDE 0.9 % IV BOLUS
500.0000 mL | Freq: Once | INTRAVENOUS | Status: DC
Start: 2024-02-16 — End: 2024-02-16

## 2024-02-16 NOTE — ED Triage Notes (Signed)
 Pt arrives via EMS from home with reports of weakness and recent UTI. Home health nurse reports AMS since Tuesday, pt A&O to self.

## 2024-02-16 NOTE — ED Notes (Signed)
 Assisted to bedside commode

## 2024-02-16 NOTE — ED Provider Notes (Signed)
 [facility] Provider Note    Event Date/Time   First MD Initiated Contact with Patient 02/16/24 1511     (approximate)   History   Weakness   HPI  Jasmine Malone is a 78 y.o. female  ewy body dementia, GERD, depression, HTN, HLD, history of thyroid cancer, history of CHFpEF, obesity discharged on 02/06/2024 with UTI (augmentin ) and encephalopathy, possible Lewy body dementia who presents to the emergency department with 24 hours of progressively worsening weakness.  History is obtained by patient's husband who is at bedside.  Patient has had difficulty ambulating and is more confused than baseline.  She does have neurology follow-up and does work with home PT OT.  There has been no falls.  No fevers.  Husband is worried that she has not taken much p.o.      Physical Exam   Triage Vital Signs: ED Triage Vitals  Encounter Vitals Group     BP 02/16/24 1331 (!) 151/81     Girls Systolic BP Percentile --      Girls Diastolic BP Percentile --      Boys Systolic BP Percentile --      Boys Diastolic BP Percentile --      Pulse Rate 02/16/24 1331 70     Resp 02/16/24 1331 18     Temp 02/16/24 1331 98.3 F (36.8 C)     Temp Source 02/16/24 1331 Oral     SpO2 02/16/24 1331 97 %     Weight 02/16/24 1332 219 lb 12.8 oz (99.7 kg)     Height 02/16/24 1332 5' 5 (1.651 m)     Head Circumference --      Peak Flow --      Pain Score 02/16/24 1332 0     Pain Loc --      Pain Education --      Exclude from Growth Chart --     Most recent vital signs: Vitals:   02/16/24 1719 02/16/24 2005  BP: 133/60 (!) 171/80  Pulse: 70 70  Resp: 18 19  Temp: 98.1 F (36.7 C) 98.2 F (36.8 C)  SpO2: 100% 99%    Nursing Triage Note reviewed. Vital signs reviewed and patients oxygen saturation is normoxic  General: Patient is well nourished, well developed, awake and alert, resting comfortably in no acute distress Head: Normocephalic and atraumatic Eyes: Normal  inspection, extraocular muscles intact, no conjunctival pallor Ear, nose, throat: Normal external exam Neck: Normal range of motion Respiratory: Patient is in no respiratory distress, lungs CTAB Cardiovascular: Patient is not tachycardic, RRR without murmur appreciated GI: Abd SNT with no guarding or rebound  Back: Normal inspection of the back with good strength and range of motion throughout all ext Extremities: pulses intact with good cap refills, no LE pitting edema or calf tenderness Neuro: The patient is alert and oriented to person, place, not to time and easily confused  with 5/5 bilat UE/LE strength, no gross motor or sensory defects noted.   ED Results / Procedures / Treatments   Labs (all labs ordered are listed, but only abnormal results are displayed) Labs Reviewed  COMPREHENSIVE METABOLIC PANEL WITH GFR - Abnormal; Notable for the following components:      Result Value   Glucose, Bld 108 (*)    All other components within normal limits  CBC - Abnormal; Notable for the following components:   RBC 3.76 (*)    All other components within normal limits  URINALYSIS,  ROUTINE W REFLEX MICROSCOPIC - Abnormal; Notable for the following components:   Color, Urine YELLOW (*)    APPearance HAZY (*)    All other components within normal limits  LACTIC ACID, PLASMA - Abnormal; Notable for the following components:   Lactic Acid, Venous 2.0 (*)    All other components within normal limits  BLOOD GAS, VENOUS - Abnormal; Notable for the following components:   pH, Ven 7.46 (*)    pCO2, Ven 41 (*)    Bicarbonate 29.2 (*)    Acid-Base Excess 4.9 (*)    All other components within normal limits  PROCALCITONIN     EKG EKG and rhythm strip are interpreted by myself:   EKG: [Normal sinus rhythm] at heart rate of 69, normal QRS duration, QTc 497, nonspecific ST segments and T waves no ectopy EKG not consistent with Acute STEMI Rhythm strip: NSR in lead II   RADIOLOGY CT head:  No intracranial hemorrhage on my independent review interpretation radiologist agrees    PROCEDURES:  Critical Care performed: No  Procedures   MEDICATIONS ORDERED IN ED: Medications  sodium chloride  0.9 % bolus 1,000 mL (0 mLs Intravenous Stopped 02/16/24 2008)     IMPRESSION / MDM / ASSESSMENT AND PLAN / ED COURSE                                Differential diagnosis includes, but is not limited to, UTI intracranial hemorrhage, pneumonia, arrhythmia, dehydration, electrolyte derangement, progressive dementia   ED course: Patient presents with worsening weakness and confusion but has no focal deficits.  CT head demonstrated no intracranial hemorrhage and workup demonstrated no evidence of infection.  Patient was given 1 L of fluid given concern for dehydration.  Case was discussed with the patient's POA (her daughter) and family does not want the patient to become an ED boarder for possible skilled nursing placement.  They plan to transition the patient to hospice care in the next week.  All feel reassured that there was no infection and feel comfortable returning home at this time.   Clinical Course as of 02/17/24 0003  Thu Feb 16, 2024  1522 Daughters name: Jasmine Malone Mate: [phone] [HD]  1614 pH, Ven(!): 7.46 No acute abnormality [HD]  1614 Comprehensive metabolic panel(!) No profound electrolyte derangements [HD]  1615 WBC: 6.9 No leukocytosis [HD]  1729 Urinalysis, Routine w reflex microscopic -Urine, Clean Catch(!) Not consistent with infection [HD]  1729 Lactic Acid, Venous(!!): 2.0 Likely secondary to dehydration will give a bolus of fluids [HD]  1730 CT Head Wo Contrast No acute abnormality [HD]  1730 DG Chest 2 View No acute abnormality [HD]  1742 I talked to patient's daughter Jasmine Malone and explained the situation.  They were very understanding and states that the patient would not want to be, ED boarder under any circumstances.  They were planning for  Perative/hospice and that the patient would want to return home.  Given the concerns for not drinking fluid at home.  Will insert an IV and give 1 L of fluid.  Will plan for discharge afterwards [HD]    Clinical Course User Index [HD] Nicholaus Rolland BRAVO, MD   -- Risk: 5 This patient has a high risk of morbidity due to further diagnostic testing or treatment. Rationale: This patient's evaluation and management involve a high risk of morbidity due to the potential severity of presenting symptoms, need for diagnostic testing,  and/or initiation of treatment that may require close monitoring. The differential includes conditions with potential for significant deterioration or requiring escalation of care. Treatment decisions in the ED, including medication administration, procedural interventions, or disposition planning, reflect this level of risk. COPA: 5 The patient has the following acute or chronic illness/injury that poses a possible threat to life or bodily function: [X] : The patient has a potentially serious acute condition or an acute exacerbation of a chronic illness requiring urgent evaluation and management in the Emergency Department. The clinical presentation necessitates immediate consideration of life-threatening or function-threatening diagnoses, even if they are ultimately ruled out.   FINAL CLINICAL IMPRESSION(S) / ED DIAGNOSES   Final diagnoses:  Encephalopathy, unspecified type     Rx / DC Orders   ED Discharge Orders     None        Note:  This document was prepared using Dragon voice recognition software and may include unintentional dictation errors.   Nicholaus Rolland BRAVO, MD 02/17/24 [phone]

## 2024-02-16 NOTE — Telephone Encounter (Signed)
 Provider FYI ONLY Disposition:911/EMS caller  Agreed to Disposition       Reason for call: Home Health:[facility] ,[facility]  RN  is calling for:  Weakness on arms, disorientation  Reports pt was hospitalized 2 times in sept Today V/S ok, no fever Reports Mental status changed , cannot tell her name, drowsy cannot stay awake started since pt in the hospital  Reports was Treated with Augmentin   for UTI last hospitalization per [facility] RN  Also reports started  on 10/7  noticed left side weakness on arms worsen , same amount of weakness on both legs  both legs are weak  per [facility] RN  Reports Shallow breathing last night but ok today ok  RR-18bpm Pt was able to day her name this time  but not sure what's the day today and still acting confuse per [facility]       Actions taken during call: Routed encounter to Provider Scheduled Appointments  Future Appointments     Date/Time Provider Department Center Visit Type   03/09/2024 10:40 AM (Arrive by 10:25 AM) Claudene Rayfield Lunger, MD [facility] OFFICE VISIT   03/14/2024 12:00 PM (Arrive by 11:30 AM) CC MR 1 [facility] MRI ABD AND MRCP WO  W3D   03/14/2024 3:00 PM (Arrive by 2:30 PM) Allison Rocky Caldron, PA [facility] GI RETURN   03/19/2024 2:00 PM (Arrive by 1:45 PM) Hershal Lynwood Cough, MD [facility] CARD RETURN   06/15/2024 8:00 AM (Arrive by 7:45 AM) POPULATION HEALTH NURSE - [facility] WELLNESS   10/25/2024 10:30 AM (Arrive by 10:15 AM) [facility] [place] [facility]   10/29/2024 10:00 AM (Arrive by 9:45 AM) Midge Krystal Fallow, [facility] ENDO RETURN ADULT   03/12/2025 1:30 PM (Arrive by 1:15 PM) Courtney Dedra Bence, PA [facility] [place] NEW PRIMARY CARE ACCESS       Triage: Triage  completed, care advice given per protocol. Triage:Call back parameters given and caller advised of 24 hour nurse triage. Instructed to seek immediate medical attention if new symptoms develop, current symptoms worsen or if you become increasingly concerned. Patient verbalized understanding.     Michal BSN, RN  [facility] Patient Engagement Center  Patient Engagement Center Documentation   Reason for Disposition . Difficult to awaken or acting confused (e.g., disoriented, slurred speech) . Difficult to awaken or acting confused (disoriented, slurred speech) and new onset  Additional Information . Negative: SEVERE difficulty breathing (e.g., struggling for each breath, speaks in single words) . Negative: Shock suspected (e.g., cold/pale/clammy skin, too weak to stand) . Negative: Difficult to awaken or acting confused (disoriented, slurred speech) and diabetic  Protocols used: Weakness (Generalized) and Fatigue-A-OH, Confusion - Delirium-A-OH

## 2024-02-16 NOTE — ED Notes (Signed)
 Family at bedside states he will transport patient home. IVF almost complete approx left and patient expresses she would like to finish bag prior to discharge.

## 2024-02-16 NOTE — Discharge Instructions (Signed)

## 2024-02-18 ENCOUNTER — Other Ambulatory Visit: Payer: Self-pay | Admitting: Neurology

## 2024-02-18 DIAGNOSIS — G20C Parkinsonism, unspecified: Secondary | ICD-10-CM

## 2024-02-18 DIAGNOSIS — R251 Tremor, unspecified: Secondary | ICD-10-CM

## 2024-03-06 ENCOUNTER — Ambulatory Visit: Payer: Self-pay

## 2024-03-06 ENCOUNTER — Institutional Professional Consult (permissible substitution): Admitting: Psychology

## 2024-03-13 ENCOUNTER — Encounter: Admitting: Psychology

## 2024-03-20 ENCOUNTER — Ambulatory Visit: Admitting: Neurology

## 2024-05-13 ENCOUNTER — Other Ambulatory Visit: Payer: Self-pay | Admitting: Neurology

## 2024-05-13 DIAGNOSIS — G20C Parkinsonism, unspecified: Secondary | ICD-10-CM

## 2024-05-13 DIAGNOSIS — R251 Tremor, unspecified: Secondary | ICD-10-CM

## 2024-06-10 DEATH — deceased
